# Patient Record
Sex: Female | Born: 1970 | Hispanic: No | Marital: Married | Smoking: Never smoker
Health system: Southern US, Community
[De-identification: ages and names within clinical notes are randomized; demographics above are authoritative.]

## PROBLEM LIST (undated history)

## (undated) DIAGNOSIS — N186 End stage renal disease: Secondary | ICD-10-CM

## (undated) DIAGNOSIS — E1122 Type 2 diabetes mellitus with diabetic chronic kidney disease: Secondary | ICD-10-CM

## (undated) DIAGNOSIS — I2510 Atherosclerotic heart disease of native coronary artery without angina pectoris: Secondary | ICD-10-CM

## (undated) DIAGNOSIS — Z992 Dependence on renal dialysis: Secondary | ICD-10-CM

## (undated) DIAGNOSIS — I739 Peripheral vascular disease, unspecified: Secondary | ICD-10-CM

## (undated) DIAGNOSIS — I779 Disorder of arteries and arterioles, unspecified: Secondary | ICD-10-CM

## (undated) DIAGNOSIS — I10 Essential (primary) hypertension: Secondary | ICD-10-CM

## (undated) HISTORY — PX: LAPAROSCOPIC BILATERAL SALPINGO OOPHERECTOMY: SHX5890

---

## 2014-02-21 HISTORY — PX: CARDIAC CATHETERIZATION: SHX172

## 2014-02-24 ENCOUNTER — Encounter (HOSPITAL_COMMUNITY): Payer: Self-pay | Admitting: Emergency Medicine

## 2014-02-24 ENCOUNTER — Inpatient Hospital Stay (HOSPITAL_COMMUNITY)
Admission: EM | Admit: 2014-02-24 | Discharge: 2014-02-26 | DRG: 640 | Disposition: A | Discharge status: Other Acute Inpatient Hospital | Payer: BC Managed Care – PPO | Attending: Internal Medicine | Admitting: Internal Medicine

## 2014-02-24 ENCOUNTER — Emergency Department (HOSPITAL_COMMUNITY): Payer: BC Managed Care – PPO

## 2014-02-24 DIAGNOSIS — I214 Non-ST elevation (NSTEMI) myocardial infarction: Secondary | ICD-10-CM

## 2014-02-24 DIAGNOSIS — D649 Anemia, unspecified: Secondary | ICD-10-CM | POA: Diagnosis present

## 2014-02-24 DIAGNOSIS — I10 Essential (primary) hypertension: Secondary | ICD-10-CM

## 2014-02-24 DIAGNOSIS — I2510 Atherosclerotic heart disease of native coronary artery without angina pectoris: Secondary | ICD-10-CM

## 2014-02-24 DIAGNOSIS — Z7982 Long term (current) use of aspirin: Secondary | ICD-10-CM

## 2014-02-24 DIAGNOSIS — Z79899 Other long term (current) drug therapy: Secondary | ICD-10-CM

## 2014-02-24 DIAGNOSIS — R112 Nausea with vomiting, unspecified: Secondary | ICD-10-CM

## 2014-02-24 DIAGNOSIS — N2581 Secondary hyperparathyroidism of renal origin: Secondary | ICD-10-CM | POA: Diagnosis present

## 2014-02-24 DIAGNOSIS — N186 End stage renal disease: Secondary | ICD-10-CM

## 2014-02-24 DIAGNOSIS — R799 Abnormal finding of blood chemistry, unspecified: Secondary | ICD-10-CM

## 2014-02-24 DIAGNOSIS — N19 Unspecified kidney failure: Secondary | ICD-10-CM

## 2014-02-24 DIAGNOSIS — R7989 Other specified abnormal findings of blood chemistry: Secondary | ICD-10-CM

## 2014-02-24 DIAGNOSIS — Z992 Dependence on renal dialysis: Secondary | ICD-10-CM

## 2014-02-24 DIAGNOSIS — R778 Other specified abnormalities of plasma proteins: Secondary | ICD-10-CM | POA: Diagnosis present

## 2014-02-24 DIAGNOSIS — E875 Hyperkalemia: Principal | ICD-10-CM

## 2014-02-24 DIAGNOSIS — R9431 Abnormal electrocardiogram [ECG] [EKG]: Secondary | ICD-10-CM | POA: Diagnosis present

## 2014-02-24 DIAGNOSIS — I120 Hypertensive chronic kidney disease with stage 5 chronic kidney disease or end stage renal disease: Secondary | ICD-10-CM | POA: Diagnosis present

## 2014-02-24 DIAGNOSIS — E1129 Type 2 diabetes mellitus with other diabetic kidney complication: Secondary | ICD-10-CM

## 2014-02-24 HISTORY — DX: Atherosclerotic heart disease of native coronary artery without angina pectoris: I25.10

## 2014-02-24 HISTORY — PX: CORONARY ARTERY BYPASS GRAFT: SHX141

## 2014-02-24 LAB — RENAL FUNCTION PANEL
Albumin: 3.1 g/dL — ABNORMAL LOW (ref 3.5–5.2)
BUN: 67 mg/dL — AB (ref 6–23)
CO2: 23 mEq/L (ref 19–32)
CREATININE: 7.08 mg/dL — AB (ref 0.50–1.10)
Calcium: 8.9 mg/dL (ref 8.4–10.5)
Chloride: 89 mEq/L — ABNORMAL LOW (ref 96–112)
GFR calc non Af Amer: 6 mL/min — ABNORMAL LOW (ref >90)
GFR, EST AFRICAN AMERICAN: 7 mL/min — AB (ref >90)
GLUCOSE: 260 mg/dL — AB (ref 70–99)
PHOSPHORUS: 6.5 mg/dL — AB (ref 2.3–4.6)
Potassium: 6.5 mEq/L (ref 3.7–5.3)
Sodium: 127 mEq/L — ABNORMAL LOW (ref 137–147)

## 2014-02-24 LAB — COMPREHENSIVE METABOLIC PANEL
ALK PHOS: 83 U/L (ref 39–117)
ALT: 20 U/L (ref 0–35)
AST: 15 U/L (ref 0–37)
Albumin: 3.1 g/dL — ABNORMAL LOW (ref 3.5–5.2)
BUN: 63 mg/dL — ABNORMAL HIGH (ref 6–23)
CO2: 21 meq/L (ref 19–32)
Calcium: 8.3 mg/dL — ABNORMAL LOW (ref 8.4–10.5)
Chloride: 88 mEq/L — ABNORMAL LOW (ref 96–112)
Creatinine, Ser: 7.04 mg/dL — ABNORMAL HIGH (ref 0.50–1.10)
GFR calc Af Amer: 7 mL/min — ABNORMAL LOW (ref >90)
GFR, EST NON AFRICAN AMERICAN: 6 mL/min — AB (ref >90)
GLUCOSE: 255 mg/dL — AB (ref 70–99)
SODIUM: 124 meq/L — AB (ref 137–147)
TOTAL PROTEIN: 6.9 g/dL (ref 6.0–8.3)
Total Bilirubin: 0.4 mg/dL (ref 0.3–1.2)

## 2014-02-24 LAB — I-STAT TROPONIN, ED: Troponin i, poc: 0.59 ng/mL (ref 0.00–0.08)

## 2014-02-24 LAB — CBC WITH DIFFERENTIAL/PLATELET
Basophils Absolute: 0 10*3/uL (ref 0.0–0.1)
Basophils Relative: 0 % (ref 0–1)
Eosinophils Absolute: 0.1 10*3/uL (ref 0.0–0.7)
Eosinophils Relative: 1 % (ref 0–5)
HCT: 33.1 % — ABNORMAL LOW (ref 36.0–46.0)
Hemoglobin: 11 g/dL — ABNORMAL LOW (ref 12.0–15.0)
Lymphocytes Relative: 12 % (ref 12–46)
Lymphs Abs: 1.1 10*3/uL (ref 0.7–4.0)
MCH: 31.4 pg (ref 26.0–34.0)
MCHC: 33.2 g/dL (ref 30.0–36.0)
MCV: 94.6 fL (ref 78.0–100.0)
Monocytes Absolute: 0.2 10*3/uL (ref 0.1–1.0)
Monocytes Relative: 3 % (ref 3–12)
NEUTROS PCT: 84 % — AB (ref 43–77)
Neutro Abs: 7.5 10*3/uL (ref 1.7–7.7)
Platelets: 187 10*3/uL (ref 150–400)
RBC: 3.5 MIL/uL — AB (ref 3.87–5.11)
RDW: 15.2 % (ref 11.5–15.5)
WBC: 8.9 10*3/uL (ref 4.0–10.5)

## 2014-02-24 LAB — HEPATITIS B SURFACE ANTIGEN: Hepatitis B Surface Ag: NEGATIVE

## 2014-02-24 LAB — I-STAT CHEM 8, ED
BUN: 70 mg/dL — AB (ref 6–23)
CREATININE: 7.8 mg/dL — AB (ref 0.50–1.10)
Calcium, Ion: 1.04 mmol/L — ABNORMAL LOW (ref 1.12–1.23)
Chloride: 95 mEq/L — ABNORMAL LOW (ref 96–112)
Glucose, Bld: 256 mg/dL — ABNORMAL HIGH (ref 70–99)
HCT: 35 % — ABNORMAL LOW (ref 36.0–46.0)
Hemoglobin: 11.9 g/dL — ABNORMAL LOW (ref 12.0–15.0)
Potassium: 7.9 mEq/L (ref 3.7–5.3)
SODIUM: 125 meq/L — AB (ref 137–147)
TCO2: 22 mmol/L (ref 0–100)

## 2014-02-24 LAB — GLUCOSE, CAPILLARY: GLUCOSE-CAPILLARY: 75 mg/dL (ref 70–99)

## 2014-02-24 MED ORDER — SODIUM POLYSTYRENE SULFONATE 15 GM/60ML PO SUSP
30.0000 g | Freq: Once | ORAL | Status: AC
  Administered 2014-02-24: 30 g via ORAL
  Filled 2014-02-24: qty 120

## 2014-02-24 MED ORDER — ACETAMINOPHEN 325 MG PO TABS: 650.0000 mg | ORAL_TABLET | Freq: Four times a day (QID) | ORAL | Status: DC | PRN

## 2014-02-24 MED ORDER — ASPIRIN 325 MG PO TABS
325.0000 mg | ORAL_TABLET | Freq: Once | ORAL | Status: AC
  Administered 2014-02-24: 325 mg via ORAL
  Filled 2014-02-24: qty 1

## 2014-02-24 MED ORDER — VITAMIN D (ERGOCALCIFEROL) 1.25 MG (50000 UNIT) PO CAPS
50000.0000 [IU] | ORAL_CAPSULE | ORAL | Status: DC
  Administered 2014-02-24: 50000 [IU] via ORAL
  Filled 2014-02-24: qty 1

## 2014-02-24 MED ORDER — ALBUTEROL SULFATE (2.5 MG/3ML) 0.083% IN NEBU: 2.5000 mg | INHALATION_SOLUTION | RESPIRATORY_TRACT | Status: DC | PRN

## 2014-02-24 MED ORDER — CARVEDILOL 25 MG PO TABS
25.0000 mg | ORAL_TABLET | Freq: Two times a day (BID) | ORAL | Status: DC
  Administered 2014-02-25 – 2014-02-26 (×4): 25 mg via ORAL
  Filled 2014-02-24 (×5): qty 1

## 2014-02-24 MED ORDER — ACETAMINOPHEN 325 MG PO TABS
650.0000 mg | ORAL_TABLET | Freq: Once | ORAL | Status: AC
  Administered 2014-02-24: 650 mg via ORAL

## 2014-02-24 MED ORDER — ONDANSETRON HCL 4 MG PO TABS: 4.0000 mg | ORAL_TABLET | Freq: Every day | ORAL | Status: DC | PRN

## 2014-02-24 MED ORDER — ACETAMINOPHEN 325 MG PO TABS
ORAL_TABLET | ORAL | Status: AC
Start: 2014-02-24 — End: 2014-02-25
  Filled 2014-02-24: qty 2

## 2014-02-24 MED ORDER — SEVELAMER CARBONATE 800 MG PO TABS
1600.0000 mg | ORAL_TABLET | Freq: Three times a day (TID) | ORAL | Status: DC
  Filled 2014-02-24 (×5): qty 2

## 2014-02-24 MED ORDER — DEXTROSE 50 % IV SOLN
50.0000 mL | Freq: Once | INTRAVENOUS | Status: AC
  Administered 2014-02-24: 50 mL via INTRAVENOUS
  Filled 2014-02-24: qty 50

## 2014-02-24 MED ORDER — SODIUM CHLORIDE 0.9 % IV SOLN
1.0000 g | Freq: Once | INTRAVENOUS | Status: AC
  Administered 2014-02-24: 1 g via INTRAVENOUS
  Filled 2014-02-24: qty 10

## 2014-02-24 MED ORDER — ONDANSETRON HCL 4 MG/2ML IJ SOLN
4.0000 mg | Freq: Once | INTRAMUSCULAR | Status: AC
  Administered 2014-02-24: 4 mg via INTRAVENOUS
  Filled 2014-02-24: qty 2

## 2014-02-24 MED ORDER — ACETAMINOPHEN 650 MG RE SUPP: 650.0000 mg | Freq: Four times a day (QID) | RECTAL | Status: DC | PRN

## 2014-02-24 MED ORDER — INSULIN ASPART 100 UNIT/ML IV SOLN
10.0000 [IU] | Freq: Once | INTRAVENOUS | Status: AC
  Administered 2014-02-24: 10 [IU] via INTRAVENOUS
  Filled 2014-02-24 (×2): qty 0.1

## 2014-02-24 MED ORDER — HYDROMORPHONE HCL PF 1 MG/ML IJ SOLN: 1.0000 mg | INTRAMUSCULAR | Status: DC | PRN

## 2014-02-24 MED ORDER — ALBUTEROL SULFATE (2.5 MG/3ML) 0.083% IN NEBU
5.0000 mg | INHALATION_SOLUTION | Freq: Once | RESPIRATORY_TRACT | Status: AC
  Administered 2014-02-24: 5 mg via RESPIRATORY_TRACT
  Filled 2014-02-24: qty 6

## 2014-02-24 MED ORDER — ONDANSETRON HCL 4 MG/2ML IJ SOLN: 4.0000 mg | Freq: Four times a day (QID) | INTRAMUSCULAR | Status: DC | PRN

## 2014-02-24 MED ORDER — ASPIRIN EC 81 MG PO TBEC
81.0000 mg | DELAYED_RELEASE_TABLET | Freq: Every day | ORAL | Status: DC
  Administered 2014-02-25 – 2014-02-26 (×2): 81 mg via ORAL
  Filled 2014-02-24 (×2): qty 1

## 2014-02-24 MED ORDER — SODIUM CHLORIDE 0.9 % IV SOLN: 62.5000 mg | INTRAVENOUS | Status: DC

## 2014-02-24 MED ORDER — HEPARIN SODIUM (PORCINE) 5000 UNIT/ML IJ SOLN
5000.0000 [IU] | Freq: Three times a day (TID) | INTRAMUSCULAR | Status: DC
  Administered 2014-02-24 – 2014-02-26 (×4): 5000 [IU] via SUBCUTANEOUS
  Filled 2014-02-24 (×8): qty 1

## 2014-02-24 MED ORDER — DILTIAZEM HCL ER COATED BEADS 360 MG PO CP24
360.0000 mg | ORAL_CAPSULE | Freq: Every day | ORAL | Status: DC
  Administered 2014-02-25 – 2014-02-26 (×2): 360 mg via ORAL
  Filled 2014-02-24 (×2): qty 1

## 2014-02-24 MED ORDER — CALCITRIOL 0.25 MCG PO CAPS
0.2500 ug | ORAL_CAPSULE | ORAL | Status: DC
  Administered 2014-02-25: 0.25 ug via ORAL
  Filled 2014-02-24 (×2): qty 1

## 2014-02-24 MED ORDER — INSULIN ASPART 100 UNIT/ML ~~LOC~~ SOLN
0.0000 [IU] | Freq: Three times a day (TID) | SUBCUTANEOUS | Status: DC
  Administered 2014-02-25: 2 [IU] via SUBCUTANEOUS
  Administered 2014-02-26: 1 [IU] via SUBCUTANEOUS
  Administered 2014-02-26: 2 [IU] via SUBCUTANEOUS

## 2014-02-24 MED ORDER — SEVELAMER CARBONATE 800 MG PO TABS
2400.0000 mg | ORAL_TABLET | Freq: Three times a day (TID) | ORAL | Status: DC
  Administered 2014-02-25 – 2014-02-26 (×6): 2400 mg via ORAL
  Filled 2014-02-24 (×7): qty 3

## 2014-02-24 MED ORDER — ALUM & MAG HYDROXIDE-SIMETH 200-200-20 MG/5ML PO SUSP: 30.0000 mL | Freq: Four times a day (QID) | ORAL | Status: DC | PRN

## 2014-02-24 MED ORDER — ACETAMINOPHEN 325 MG PO TABS
325.0000 mg | ORAL_TABLET | Freq: Two times a day (BID) | ORAL | Status: DC | PRN
  Administered 2014-02-25: 325 mg via ORAL

## 2014-02-24 MED ORDER — CALCITRIOL 0.25 MCG PO CAPS
0.2500 ug | ORAL_CAPSULE | Freq: Every day | ORAL | Status: DC
  Administered 2014-02-24 – 2014-02-26 (×2): 0.25 ug via ORAL
  Filled 2014-02-24 (×3): qty 1

## 2014-02-24 MED ORDER — ONDANSETRON HCL 4 MG PO TABS: 4.0000 mg | ORAL_TABLET | Freq: Four times a day (QID) | ORAL | Status: DC | PRN

## 2014-02-24 MED ORDER — SEVELAMER CARBONATE 800 MG PO TABS: 800.0000 mg | ORAL_TABLET | Freq: Three times a day (TID) | ORAL | Status: DC

## 2014-02-24 MED ORDER — SODIUM BICARBONATE 8.4 % IV SOLN
50.0000 meq | Freq: Once | INTRAVENOUS | Status: AC
  Administered 2014-02-24: 50 meq via INTRAVENOUS
  Filled 2014-02-24: qty 50

## 2014-02-24 MED ORDER — LOSARTAN POTASSIUM 25 MG PO TABS
25.0000 mg | ORAL_TABLET | Freq: Every day | ORAL | Status: DC
  Administered 2014-02-25: 25 mg via ORAL
  Filled 2014-02-24 (×2): qty 1

## 2014-02-24 MED ORDER — RENA-VITE PO TABS
1.0000 | ORAL_TABLET | Freq: Every day | ORAL | Status: DC
  Administered 2014-02-24 – 2014-02-25 (×2): 1 via ORAL
  Filled 2014-02-24 (×3): qty 1

## 2014-02-24 MED ORDER — SODIUM CHLORIDE 0.9 % IJ SOLN
3.0000 mL | Freq: Two times a day (BID) | INTRAMUSCULAR | Status: DC
  Administered 2014-02-24 – 2014-02-26 (×4): 3 mL via INTRAVENOUS

## 2014-02-24 NOTE — ED Notes (Signed)
Abnormal labs given to Dr. Darl Householder

## 2014-02-24 NOTE — Consult Note (Signed)
CARDIOLOGY CONSULT NOTE   Patient ID: Jeffrie Stander MRN: [id number] DOB/AGE: Feb 10, 1971 43 y.o.  Admit Date: 02/24/2014 Referring Physician: PTH Primary Physician: No primary provider on file. Consulting Cardiologist: [place] Primary Cardiologist:  1. [facility]. [facility] 2. [facility] 3. [facility]  Reason for Consultation:  Positive Troponin with known CAD  Clinical Summary Ms. Yahr is a 43 y.o.female with history of known coronary artery disease, hypertension, diabetes, end-stage renal disease, and hyperlipidemia. The patient is being planned for kidney transplant at [facility], and underwent a pre-operative cardiac evaluation by normally fDr.[facility], where she had an abnormal stress test. He was transferred to [facility] where she underwent cardiac catheterization on 02/21/2014.   This demonstrated three-vessel coronary artery disease, LAD 90% proximal diagonal 95% left circumflex 75% proximal with OM1 90% right coronary artery 95% proximal. She is planned for coronary artery bypass grafting by Dr. Olena Leatherwood in June of 2015.  The patient was in her usual state of health when she had sudden onset of severe weakness.  She stated that she was drinking tea and was unable to finish, got up to use the bathroom and required help returning back to her seat. EMS was called and she was brought to the emergency room.  On arrival the patient blood pressure was 105/78 heart rate 58 O2 sat 100%. The patient's was found to be hyperkalemic with a potassium of 7.9, sodium 125, chloride 95, BUN 70, serum creatinine 7.80. Glucose was 256. She was found to be mildly anemic with a hemoglobin 11.0, hematocrit of 33.1 chest x-ray revealed cardiomegaly with mild pulmonary vascular congestion. EKG revealing normal sinus rhythm with right bundle branch block prolonged QT interval.  Currently the patient is feeling  some chest pressure, but denies chest pain or shortness of breath nausea vomiting or diaphoresis. The patient is seen and examined in the emergency room prior to being transferred to  dialysis Allergies  Allergen Reactions  . Norvasc [Amlodipine] Swelling    Medications Scheduled Medications: . calcitRIOL  0.25 mcg Oral Daily  . [START ON 02/27/2014] ferric gluconate (FERRLECIT/NULECIT) IV  62.5 mg Intravenous Q Wed-HD  . multivitamin  1 tablet Oral QHS  . sevelamer carbonate  1,600 mg Oral TID WC    Infusions:    PRN Medications:     Past Medical History  Diagnosis Date  . Dialysis patient   . Diabetes mellitus without complication   . Coronary artery disease     Past Surgical History  Procedure Laterality Date  . Cesarean section    . Laparoscopic bilateral salpingo oopherectomy    . Cardiac catheterization  02/21/14    done at [facility]-    Family History  Problem Relation Age of Onset  . Heart attack Mother   . Heart attack Father   . Heart attack Maternal Grandmother   . Heart attack Maternal Grandfather   . Hypertension Father     Social History Ms. Overacker reports that she has never smoked. She does not have any smokeless tobacco history on file. Ms. Shibuya reports that she does not drink alcohol.  Review of Systems Otherwise reviewed and negative except as outlined.  Physical Examination Blood pressure 158/86, pulse 65, resp. rate 18, SpO2 100.00%. No intake or output data in the 24 hours ending 02/24/14 1708  Telemetry: Normal sinus rhythm.  GEN: No acute distress HEENT: Conjunctiva and lids normal, oropharynx clear with moist mucosa. Neck: Supple, no elevated JVP or carotid  bruits, no thyromegaly. Lungs: Clear to auscultation, nonlabored breathing at rest. Cardiac: Regular rate and rhythm, 1/6 systolic murmur, no pericardial rub. Abdomen: Soft, nontender, no hepatomegaly, bowel sounds present, no guarding or rebound. Extremities: No pitting edema,  distal pulses 2+. Left arm dialysis fistula with good thrill. Skin: Warm and dry. Musculoskeletal: No kyphosis. Neuropsychiatric: Alert and oriented x3, affect grossly appropriate.  Prior Cardiac Testing/Procedures 1. Cardiac cath-[facility] 02/21/2014 LAD - 90% proximal; D1 - 95% LCx dominant - - 75% proximal; OM1 - 90% RCA - codominant; 95% proxia LV - moderate GH   Lab Results  Basic Metabolic Panel:  Recent Labs Lab 02/24/14 1440 02/24/14 1459  NA 124* 125*  K >7.7* 7.9*  CL 88* 95*  CO2 21  --   GLUCOSE 255* 256*  BUN 63* 70*  CREATININE 7.04* 7.80*  CALCIUM 8.3*  --     Liver Function Tests:  Recent Labs Lab 02/24/14 1440  AST 15  ALT 20  ALKPHOS 83  BILITOT 0.4  PROT 6.9  ALBUMIN 3.1*    CBC:  Recent Labs Lab 02/24/14 1440 02/24/14 1459  WBC 8.9  --   NEUTROABS 7.5  --   HGB 11.0* 11.9*  HCT 33.1* 35.0*  MCV 94.6  --   PLT 187  --     Cardiac Enzymes: No results found for this basename: CKTOTAL, CKMB, CKMBINDEX, TROPONINI,  in the last 168 hours  BNP: No components found with this basename: POCBNP,    Radiology: Dg Chest Portable 1 View  02/24/2014   CLINICAL DATA:  43 year old female with weakness, nausea and vomiting.  EXAM: PORTABLE CHEST - 1 VIEW  COMPARISON:  None.  FINDINGS: Cardiomegaly and mild pulmonary vascular congestion noted.  A right IJ catheter noted with tips overlying the cavoatrial junction and right atrium.  There is no evidence of focal airspace disease, pulmonary edema, suspicious pulmonary nodule/mass, pleural effusion, or pneumothorax. No acute bony abnormalities are identified.  IMPRESSION: Cardiomegaly with mild pulmonary vascular congestion   Electronically Signed   By: Hassan Rowan M.D.   On: 02/24/2014 14:51     ECG: Normal sinus rhythm with right bundle branch block prolonged QT interval   Impression and Recommendations  1. CAD: Normally followed by Dr. Luiz Ochoa in [place], [place], with abnormal  stress test. She is now followed by Dr. Duke Salvia at [facility] with cardiac catheterization on [id number] revealing three-vessel disease, was planned coronary artery bypass grafting by Dr. Glenda Chroman at [facility] in June. The patient will be continued on her current medication regimen she was taking prior to admission, watching blood pressure and heart rate. Would not start beta blockers at this time in the setting of hyperkalemia. The patient will be followed closely with blood pressure monitoring and evaluation. No further testing will be completed if she has had an extensive cardiac workup prior to this admission within the last 3 days.  2. ESRD: Going to dialysis emergently secondary to hyperkalemia with potassium of 7.7. Patient is planned for kidney transplant with her sister as donor once cardiac issues have been resolved. All of these evaluations and surgeries are planned at [facility].  3.  Hypertension: Charlestine Massed moderately controlled with most recent blood pressure 158/86. The patient will be continued on antihypertensive medications as she was taking prior to admission, but will be closely followed to evaluate for hypotension after dialysis.  4. Diabetes: To be followed by PTH.  Signed: Phill Myron. Lawrence NP  02/24/2014, 5:08 PM Co-Sign  MD  Patient examined chart reviewed See separate progress note  Josue Hector

## 2014-02-24 NOTE — ED Notes (Signed)
resp easier, family at bedside

## 2014-02-24 NOTE — ED Notes (Signed)
Pt arrived from home by [facility] with c/o sudden onset of nausea, vomiting and weakness. Denies any pain. Pt is a dialysis pt and does dialysis at home. They were getting ready to start dialysis when pt started having episode. Pt is scheduled to have a CABG done on June 23. 12 lead showed NSR with Bigeminy, RBBB, and first degree AV block. Pt is supposed to have dialysis 4 x a week and did not do dialysis yesterday.

## 2014-02-24 NOTE — ED Notes (Addendum)
K=greater than 7.7 and blood sample is not hemolyzed; Frederica Kuster, lab tech reported; Eme, RN and Dr. Darl Householder are aware

## 2014-02-24 NOTE — ED Notes (Addendum)
Pt had Cardiac cath done at [facility] on Thursday-- has OHS scheduled June 23rd at [facility].  Dialysis catheter in right subclavian area placed on 08/14/13.

## 2014-02-24 NOTE — Procedures (Signed)
I was present at this dialysis session, have reviewed the session itself and made  appropriate changes  Kelly Splinter MD (pgr) [phone]    ([phone] 02/24/2014, 5:41 PM

## 2014-02-24 NOTE — ED Provider Notes (Signed)
CSN: [id number]     Arrival date & time 02/24/14  1302 History   First MD Initiated Contact with Patient 02/24/14 1307     Chief Complaint  Patient presents with  . Emesis  . Weakness     (Consider location/radiation/quality/duration/timing/severity/associated sxs/prior Treatment) The history is provided by the patient.  Isabel Sharp is a 43 y.o. female hx of DM, CAD, home dialysis (last dialyzed 2 days ago) here with weakness, vomiting. She is on dialysis at home four times a week. Last dialysis was 2 days ago. She has been feeling nauseous and vomiting and weakness. She missed her dialysis yesterday. She was about to start dialysis today and then suddenly became more weak and nauseous and short of breath. She states that she is weak to the point that she is trouble ambulating. Denies trouble speaking. Denies any chest pain she does have significant coronary artery disease and is scheduled for bypass in June.    Past Medical History  Diagnosis Date  . Dialysis patient   . Diabetes mellitus without complication   . Coronary artery disease    Past Surgical History  Procedure Laterality Date  . Cesarean section    . Laparoscopic bilateral salpingo oopherectomy    . Cardiac catheterization  02/21/14    done at [facility]-   No family history on file. History  Substance Use Topics  . Smoking status: Never Smoker   . Smokeless tobacco: Not on file  . Alcohol Use: No   OB History   Grav Para Term Preterm Abortions TAB SAB Ect Mult Living                 Review of Systems  Respiratory: Positive for shortness of breath.   Gastrointestinal: Positive for vomiting.  Neurological: Positive for weakness.  All other systems reviewed and are negative.     Allergies  Norvasc  Home Medications   Prior to Admission medications   Medication Sig Start Date End Date Taking? Authorizing Provider  diltiazem (CARDIZEM CD) 360 MG 24 hr capsule Take 360 mg by mouth daily.   Yes Historical  Provider, MD   BP 105/78  Pulse 58  Resp 18  SpO2 100% Physical Exam  Nursing note and vitals reviewed. Constitutional: She is oriented to person, place, and time.  Chronically ill, uncomfortable   HENT:  Head: Normocephalic.  Mouth/Throat: Oropharynx is clear and moist.  Eyes: Conjunctivae are normal. Pupils are equal, round, and reactive to light.  Neck: Normal range of motion. Neck supple.  Cardiovascular: Normal rate, regular rhythm and normal heart sounds.   Pulmonary/Chest: Effort normal.  Dec breath sounds bilateral bases. R chest with dialysis catheter with no obvious cellulitis   Abdominal: Soft. Bowel sounds are normal. She exhibits no distension. There is no tenderness. There is no rebound.  Musculoskeletal: Normal range of motion.  1+ edema bilaterally   Neurological: She is alert and oriented to person, place, and time. No cranial nerve deficit. Coordination normal.  Skin: Skin is warm and dry.  Psychiatric: She has a normal mood and affect. Her behavior is normal. Judgment and thought content normal.    ED Course  Procedures (including critical care time) Labs Review Labs Reviewed  CBC WITH DIFFERENTIAL - Abnormal; Notable for the following:    RBC 3.50 (*)    Hemoglobin 11.0 (*)    HCT 33.1 (*)    Neutrophils Relative % 84 (*)    All other components within normal limits  COMPREHENSIVE METABOLIC  PANEL - Abnormal; Notable for the following:    Sodium 124 (*)    Chloride 88 (*)    Glucose, Bld 255 (*)    BUN 63 (*)    Creatinine, Ser 7.04 (*)    Calcium 8.3 (*)    Albumin 3.1 (*)    GFR calc non Af Amer 6 (*)    GFR calc Af Amer 7 (*)    All other components within normal limits  I-STAT TROPOININ, ED - Abnormal; Notable for the following:    Troponin i, poc 0.59 (*)    All other components within normal limits  I-STAT CHEM 8, ED - Abnormal; Notable for the following:    Sodium 125 (*)    Potassium 7.9 (*)    Chloride 95 (*)    BUN 70 (*)     Creatinine, Ser 7.80 (*)    Glucose, Bld 256 (*)    Calcium, Ion 1.04 (*)    Hemoglobin 11.9 (*)    HCT 35.0 (*)    All other components within normal limits  URINE CULTURE  URINALYSIS, ROUTINE W REFLEX MICROSCOPIC    Imaging Review Dg Chest Portable 1 View  02/24/2014   CLINICAL DATA:  43 year old female with weakness, nausea and vomiting.  EXAM: PORTABLE CHEST - 1 VIEW  COMPARISON:  None.  FINDINGS: Cardiomegaly and mild pulmonary vascular congestion noted.  A right IJ catheter noted with tips overlying the cavoatrial junction and right atrium.  There is no evidence of focal airspace disease, pulmonary edema, suspicious pulmonary nodule/mass, pleural effusion, or pneumothorax. No acute bony abnormalities are identified.  IMPRESSION: Cardiomegaly with mild pulmonary vascular congestion   Electronically Signed   By: Hassan Rowan M.D.   On: 02/24/2014 14:51     EKG Interpretation   Date/Time:  Sunday Feb 24 2014 13:17:27 EDT Ventricular Rate:  57 PR Interval:  311 QRS Duration: 180 QT Interval:  575 QTC Calculation: 560 R Axis:   66 Text Interpretation:  Sinus rhythm Prolonged PR interval Probable left  atrial enlargement Right bundle branch block No previous ECGs available  Confirmed by Ariel Dimitri  MD, Ashantee Deupree ([id number]) on 02/24/2014 1:23:05 PM      CRITICAL CARE Performed by: Wandra Arthurs   Total critical care time: 30 min   Critical care time was exclusive of separately billable procedures and treating other patients.  Critical care was necessary to treat or prevent imminent or life-threatening deterioration.  Critical care was time spent personally by me on the following activities: development of treatment plan with patient and/or surrogate as well as nursing, discussions with consultants, evaluation of patient's response to treatment, examination of patient, obtaining history from patient or surrogate, ordering and performing treatments and interventions, ordering and review of  laboratory studies, ordering and review of radiographic studies, pulse oximetry and re-evaluation of patient's condition.   MDM   Final diagnoses:  None   Isabel Sharp is a 43 y.o. female here with weakness, vomiting, SOB. Given that she missed dialysis, concerned for hyperkalemia. EKG showed possible bundle branch block, will check K. Will likely admit.   3:37 PM K 7.9 with EKG showed widened QRS. Concerned for hyperkalemia. Given ca, insulin, glucose, kayexelate, albuterol. I called Dr. Melvia Heaps, who will dialyze patient. I called Dr. Hartford Poli, who will admit. Also positive troponin, likely demand ischemia. Given ASA, hold heparin. Will have cardiology follow with patient.     Wandra Arthurs, MD 02/24/14 [phone]

## 2014-02-24 NOTE — ED Notes (Addendum)
States "it is harder to breathe-- "  PA and MD notified , O2 on 2L/M/[place]

## 2014-02-24 NOTE — ED Notes (Signed)
MD at bedside. 

## 2014-02-24 NOTE — Progress Notes (Signed)
Patient ID: Santia Labate, female   DOB: [birth year], 43 y.o.   MRN: [id number] See full note from [place]. 43 yo just cathed at [facility] after abnormal stress echo in anticipation of renal transplant On dialysis since 11/14 from DM and HTN  Sister will be donor Admitted with weakness and hyperkalemia  ECG with prominent T waves PR 333 and RBBB signs of hyperkalemia  No chest pain  Med list indicates amlodipine but she does not take as it causes swelling No beta blocker due to hyperkalemia  No report of EF from [facility] but patient denies history of MI    Cath Thursday:  Cardiac catheterization and coronary angiography. This revealed severe three-vessel coronary artery disease including a 90% proximal LAD lesion, 95% first diagonal lesion, 75% proximal left circumflex lesion, and 95% stenosis of a codominant right coronary artery.   To have CABG in June with Dr Lunette Stands  Exam remarkable for overweight indian female SEM no rub  Fistula LUE with good thrill Dialysis catheter right subclavian  Plan:  Proceed with dialysis for hyperkalemia and follow conduction abnormalities on ECG Will order echo for EF and r/o pericardial effusion Try to avoid hypotension with dialysis given severe 3VD ASA  Josue Hector

## 2014-02-24 NOTE — Consult Note (Signed)
[facility] Renal Consultation Note  Indication for Consultation:  Management of ESRD/hemodialysis; anemia, hypertension/volume and secondary hyperparathyroidism  HPI: Isabel Sharp is a 43 y.o. female on Home Hemodialysis= M,W,THUR,SAT ( Next Stage Machine) followed by Dr. Ernesto Rutherford in [place]  , [place] presents to ER co nausea,/ vomiting increasing weakness and sob to the extent was not able to put self on HD this am.  Potassium in ER 7.9 CXR showing no pulmonary edema.,She reports missing HD Yesterday with last HD Friday 02/22/14 and has been eating Tomatoes/ sweet Potatoes and other high potassium Foods.Gives history of ESRD secondary to HTN "with some diabetic history on po meds , is scheduled for CABG March 26 2014 at [place] (CAD found on Renal tx [facility]).Has a Perm cath with plans for AVF in [place] in [place] .Now in ER with Brother at bedside feels better after po Kayexalate, IV Ca Gluconate, d50 iv, Insulin iv , and Na bicarb given,    Past Medical History  Diagnosis Date  . Dialysis patient   . Diabetes mellitus without complication   . Coronary artery disease     Past Surgical History  Procedure Laterality Date  . Cesarean section    . Laparoscopic bilateral salpingo oopherectomy    . Cardiac catheterization  02/21/14    done at [facility]-     No family history on file. Social - lives in [place], married , 7 yr old son, and    reports that she has never smoked. She does not have any smokeless tobacco history on file. She reports that she does not drink alcohol or use illicit drugs.   Allergies  Allergen Reactions  . Norvasc [Amlodipine] Swelling    Prior to Admission medications   Medication Sig Start Date End Date Taking? Authorizing Provider  acetaminophen (TYLENOL) 325 MG tablet Take 325 mg by mouth 2 (two) times daily as needed (pain).   Yes Historical Provider, MD  aspirin EC 81 MG tablet Take 81 mg by mouth daily.   Yes Historical Provider, MD   bumetanide (BUMEX) 2 MG tablet Take 2 mg by mouth daily.   Yes Historical Provider, MD  calcitRIOL (ROCALTROL) 0.25 MCG capsule Take 0.25 mcg by mouth 3 (three) times a week. Monday, Wednesday and Friday   Yes Historical Provider, MD  carvedilol (COREG) 25 MG tablet Take 25 mg by mouth 2 (two) times daily with a meal.   Yes Historical Provider, MD  diltiazem (CARDIZEM CD) 120 MG 24 hr capsule Take 360 mg by mouth daily.    Yes Historical Provider, MD  glipiZIDE (GLUCOTROL) 10 MG tablet Take 10-20 mg by mouth 2 (two) times daily. Take 2 tablets (20 mg) by mouth every morning and 1 tablet (10 mg) at bedtime   Yes Historical Provider, MD  losartan (COZAAR) 25 MG tablet Take 25 mg by mouth at bedtime.    Yes Historical Provider, MD  ondansetron (ZOFRAN) 4 MG tablet Take 4 mg by mouth daily as needed for nausea or vomiting.   Yes Historical Provider, MD  pravastatin (PRAVACHOL) 40 MG tablet Take 40 mg by mouth at bedtime.   Yes Historical Provider, MD  sevelamer carbonate (RENVELA) 800 MG tablet Take 2,400 mg by mouth 3 (three) times daily with meals.   Yes Historical Provider, MD  Vitamin D, Ergocalciferol, (DRISDOL) 50000 UNITS CAPS capsule Take 50,000 Units by mouth every 7 (seven) days. On Sundays   Yes Historical Provider, MD    PRN:  Results for orders placed during the hospital encounter of 02/24/14 (from the past 48 hour(s))  CBC WITH DIFFERENTIAL     Status: Abnormal   Collection Time    02/24/14  2:40 PM      Result Value Ref Range   WBC 8.9  4.0 - 10.5 K/uL   RBC 3.50 (*) 3.87 - 5.11 MIL/uL   Hemoglobin 11.0 (*) 12.0 - 15.0 g/dL   HCT 33.1 (*) 36.0 - 46.0 %   MCV 94.6  78.0 - 100.0 fL   MCH 31.4  26.0 - 34.0 pg   MCHC 33.2  30.0 - 36.0 g/dL   RDW 15.2  11.5 - 15.5 %   Platelets 187  150 - 400 K/uL   Neutrophils Relative % 84 (*) 43 - 77 %   Neutro Abs 7.5  1.7 - 7.7 K/uL   Lymphocytes Relative 12  12 - 46 %   Lymphs Abs 1.1  0.7 - 4.0 K/uL   Monocytes Relative 3  3 - 12 %    Monocytes Absolute 0.2  0.1 - 1.0 K/uL   Eosinophils Relative 1  0 - 5 %   Eosinophils Absolute 0.1  0.0 - 0.7 K/uL   Basophils Relative 0  0 - 1 %   Basophils Absolute 0.0  0.0 - 0.1 K/uL  COMPREHENSIVE METABOLIC PANEL     Status: Abnormal (Preliminary result)   Collection Time    02/24/14  2:40 PM      Result Value Ref Range   Sodium 124 (*) 137 - 147 mEq/L   Potassium PENDING  3.7 - 5.3 mEq/L   Chloride 88 (*) 96 - 112 mEq/L   CO2 21  19 - 32 mEq/L   Glucose, Bld 255 (*) 70 - 99 mg/dL   BUN 63 (*) 6 - 23 mg/dL   Creatinine, Ser 7.04 (*) 0.50 - 1.10 mg/dL   Calcium 8.3 (*) 8.4 - 10.5 mg/dL   Total Protein 6.9  6.0 - 8.3 g/dL   Albumin 3.1 (*) 3.5 - 5.2 g/dL   AST 15  0 - 37 U/L   ALT 20  0 - 35 U/L   Alkaline Phosphatase 83  39 - 117 U/L   Total Bilirubin 0.4  0.3 - 1.2 mg/dL   GFR calc non Af Amer 6 (*) >90 mL/min   GFR calc Af Amer 7 (*) >90 mL/min   Comment: (NOTE)     The eGFR has been calculated using the CKD EPI equation.     This calculation has not been validated in all clinical situations.     eGFR's persistently <90 mL/min signify possible Chronic Kidney     Disease.  Randolm Idol, ED     Status: Abnormal   Collection Time    02/24/14  2:49 PM      Result Value Ref Range   Troponin i, poc 0.59 (*) 0.00 - 0.08 ng/mL   Comment NOTIFIED PHYSICIAN     Comment 3            Comment: Due to the release kinetics of cTnI,     a negative result within the first hours     of the onset of symptoms does not rule out     myocardial infarction with certainty.     If myocardial infarction is still suspected,     repeat the test at appropriate intervals.  I-STAT CHEM 8, ED     Status: Abnormal   Collection Time  02/24/14  2:59 PM      Result Value Ref Range   Sodium 125 (*) 137 - 147 mEq/L   Potassium 7.9 (*) 3.7 - 5.3 mEq/L   Chloride 95 (*) 96 - 112 mEq/L   BUN 70 (*) 6 - 23 mg/dL   Creatinine, Ser 7.80 (*) 0.50 - 1.10 mg/dL   Glucose, Bld 256 (*) 70 - 99 mg/dL    Calcium, Ion 1.04 (*) 1.12 - 1.23 mmol/L   TCO2 22  0 - 100 mmol/L   Hemoglobin 11.9 (*) 12.0 - 15.0 g/dL   HCT 35.0 (*) 36.0 - 46.0 %   Comment NOTIFIED PHYSICIAN      ROS: Denies fevers, chills, Some Nausea and( Constipation mild past few months,) No melena or abd pain.No dizziness or seizures.  Physical Exam: Filed Vitals:   02/24/14 1441  BP: 105/78  Pulse: 58  Resp: 18     General: Alert ,obese Female NAD, Appropriate  HEENT: [place], Nonicteric, MMM, Eyes: EOMI Neck: supple, no jvd Heart: RRR, no mur,rub or gallop Lungs: CTA bilat. non labored  Abdomen:Obese, bs pos, soft, ND,NT Extremities: no pedal edema Skin: no overt rash or pedal ulcers Neuro: alert, OX3, slow movements at first then able to move extrem. Better at end of exam Dialysis Access: R IJ Perm Cath / nontender  Dialysis: AT HOME wit Next Stage on 4times a week .MWTHRSAT EDW 73kg HD Bath /  Time 3 hrs  Heparin  2,000 units bolus trhen 2.3,2.4 boluses . Access R ij perm cath( "plans for June 1 L arm AVF") BFR   DFR      Zemplar 0 mcg IV/HD/ PO Calcitriol 0.9mg  Mon . WED. FRI/ Epogen 10,000 weekly   Units IV/HD  Venofer  573mper week hd   Assessment/Plan 1. Hyperkalemia sec. To missed hemodialysis and diet discretions - Acute HD reck K after Kayexalate and IV meds given in ER 2. ESRD -  Normally HD 4 times a week at home wit next stage machine/ hd today 4h rs and tomorrow hd to kee on schedule 3. Hypertension/volume  - stable / 2 kg over edw per home scales and bp stable / home meds Ditiazem 36054m day and Losartan 25 mg hs / vol by exam stable "usually pull 2 l at home q tx" 4. Anemia  - weekly venofer 44m28m hd and hold epo with hgb 11.0 fu am labs 5. Metabolic bone disease -   Po calcitriol; and po Ergocalciferol/ ren vela binder/ fu ca and phos 6. Nutrition - Renal /Carb  Mod./ renavite  7. CAD-meds (""for CABG March 26, 2014 [facility]"") 8. DM type 2- per admit team  DaviErnest Haber-C CaroHobart-[id number]/2015, 3:42 PM   Pt seen, examined, agree w assess/plan as above with additions as indicated. Out of town dialysis [place].  Missed her treatment yesterday, is visiting in [place] presented to ED with severe muscle weakness and K+ 7.9.  She has rec'd appropriate Rx in ED acutely and is now on dialysis for severe hyperkalemia.  Telemetry showing narrow QRS now.  There is an AVF placed in Nov '14 which does not appear ready, she is dialyzing with a catheter R IJ.   Rob Kelly Splinterpager [phone] [phone]/2015, 5:38 PM

## 2014-02-24 NOTE — H&P (Signed)
[facility] History and Physical  Isabel Sharp ZOX:[id number] DOB: 04-30-1971 DOA: 02/24/2014  Referring physician: Wandra Arthurs, MD PCP: No primary provider on file.   Chief Complaint: Weakness  HPI: Isabel Sharp is a 43 y.o. female past medical history of hypertension, CAD, diabetes and end-stage renal disease. Patient came in to the hospital because of generalized weakness. Patient does her hemodialysis at home, her nephrologist is in [place], [place]. Patient came in to the hospital because of generalized weakness and nausea/vomiting. Patient said last night she did not do dialysis but she felt tired, this morning she felt very nauseated to the point she vomited, she was weak to the point that she felt walking is a problem. She called 911 and came to the hospital for further evaluation. Please note that she had cardiac catheterization done at [facility] on 02/21/2014 by Dr. Duke Salvia and showed triple vessel disease and cardiology/CTS recommended CABG, which is scheduled on June 23. In the ED was found to have potassium of 7.9, BUN is 70 and creatinine 7.8. The POC troponin is positive with 0.59. Patient denies any chest pain or shortness of breath. Chest x-ray showed vascular congestion  Review of Systems:  Constitutional: negative for anorexia, fevers and sweats Eyes: negative for irritation, redness and visual disturbance Ears, nose, mouth, throat, and face: negative for earaches, epistaxis, nasal congestion and sore throat Respiratory: negative for cough, dyspnea on exertion, sputum and wheezing Cardiovascular: negative for chest pain, dyspnea, lower extremity edema, orthopnea, palpitations and syncope Gastrointestinal: Has nausea and vomiting, negative for abdominal pain, constipation, diarrhea, melena,   Genitourinary:negative for dysuria, frequency and hematuria Hematologic/lymphatic: negative for bleeding, easy bruising and lymphadenopathy Musculoskeletal:negative for  arthralgias, muscle weakness and stiff joints Neurological: negative for coordination problems, gait problems, headaches and weakness Endocrine: negative for diabetic symptoms including polydipsia, polyuria and weight loss Allergic/Immunologic: negative for anaphylaxis, hay fever and urticaria  Past Medical History  Diagnosis Date  . Dialysis patient   . Diabetes mellitus without complication   . Coronary artery disease    Past Surgical History  Procedure Laterality Date  . Cesarean section    . Laparoscopic bilateral salpingo oopherectomy    . Cardiac catheterization  02/21/14    done at [place] History:   reports that she has never smoked. She does not have any smokeless tobacco history on file. She reports that she does not drink alcohol or use illicit drugs.  Allergies  Allergen Reactions  . Norvasc [Amlodipine] Swelling    No family history on file.   Prior to Admission medications   Medication Sig Start Date End Date Taking? Authorizing Provider  acetaminophen (TYLENOL) 325 MG tablet Take 325 mg by mouth 2 (two) times daily as needed (pain).   Yes Historical Provider, MD  aspirin EC 81 MG tablet Take 81 mg by mouth daily.   Yes Historical Provider, MD  bumetanide (BUMEX) 2 MG tablet Take 2 mg by mouth daily.   Yes Historical Provider, MD  calcitRIOL (ROCALTROL) 0.25 MCG capsule Take 0.25 mcg by mouth 3 (three) times a week. Monday, Wednesday and Friday   Yes Historical Provider, MD  carvedilol (COREG) 25 MG tablet Take 25 mg by mouth 2 (two) times daily with a meal.   Yes Historical Provider, MD  diltiazem (CARDIZEM CD) 120 MG 24 hr capsule Take 360 mg by mouth daily.    Yes Historical Provider, MD  glipiZIDE (GLUCOTROL) 10 MG tablet Take 10-20 mg by mouth 2 (two)  times daily. Take 2 tablets (20 mg) by mouth every morning and 1 tablet (10 mg) at bedtime   Yes Historical Provider, MD  losartan (COZAAR) 25 MG tablet Take 25 mg by mouth at bedtime.    Yes Historical  Provider, MD  ondansetron (ZOFRAN) 4 MG tablet Take 4 mg by mouth daily as needed for nausea or vomiting.   Yes Historical Provider, MD  pravastatin (PRAVACHOL) 40 MG tablet Take 40 mg by mouth at bedtime.   Yes Historical Provider, MD  sevelamer carbonate (RENVELA) 800 MG tablet Take 2,400 mg by mouth 3 (three) times daily with meals.   Yes Historical Provider, MD  Vitamin D, Ergocalciferol, (DRISDOL) 50000 UNITS CAPS capsule Take 50,000 Units by mouth every 7 (seven) days. On Sundays   Yes Historical Provider, MD   Physical Exam: Filed Vitals:   02/24/14 1441  BP: 105/78  Pulse: 58  Resp: 18   Constitutional: Oriented to person, place, and time. Well-developed and well-nourished. Cooperative.  Head: Normocephalic and atraumatic.  Nose: Nose normal.  Mouth/Throat: Uvula is midline, oropharynx is clear and moist and mucous membranes are normal.  Eyes: Conjunctivae and EOM are normal. Pupils are equal, round, and reactive to light.  Neck: Trachea normal and normal range of motion. Neck supple.  Cardiovascular: Normal rate, regular rhythm, S1 normal, S2 normal, normal heart sounds and intact distal pulses.   Pulmonary/Chest: Effort normal and breath sounds normal.  Abdominal: Soft. Bowel sounds are normal. There is no hepatosplenomegaly. There is no tenderness.  Musculoskeletal: Normal range of motion.  Neurological: Alert and oriented to person, place, and time, has normal strength. No cranial nerve deficit or sensory deficit.  Skin: Skin is warm, dry and intact.  Psychiatric: Has a normal mood and affect. Speech is normal and behavior is normal.   Labs on Admission:  Basic Metabolic Panel:  Recent Labs Lab 02/24/14 1440 02/24/14 1459  NA 124* 125*  K >7.7* 7.9*  CL 88* 95*  CO2 21  --   GLUCOSE 255* 256*  BUN 63* 70*  CREATININE 7.04* 7.80*  CALCIUM 8.3*  --    Liver Function Tests:  Recent Labs Lab 02/24/14 1440  AST 15  ALT 20  ALKPHOS 83  BILITOT 0.4  PROT 6.9    ALBUMIN 3.1*   No results found for this basename: LIPASE, AMYLASE,  in the last 168 hours No results found for this basename: AMMONIA,  in the last 168 hours CBC:  Recent Labs Lab 02/24/14 1440 02/24/14 1459  WBC 8.9  --   NEUTROABS 7.5  --   HGB 11.0* 11.9*  HCT 33.1* 35.0*  MCV 94.6  --   PLT 187  --    Cardiac Enzymes: No results found for this basename: CKTOTAL, CKMB, CKMBINDEX, TROPONINI,  in the last 168 hours  BNP (last 3 results) No results found for this basename: PROBNP,  in the last 8760 hours CBG: No results found for this basename: GLUCAP,  in the last 168 hours  Radiological Exams on Admission: Dg Chest Portable 1 View  02/24/2014   CLINICAL DATA:  43 year old female with weakness, nausea and vomiting.  EXAM: PORTABLE CHEST - 1 VIEW  COMPARISON:  None.  FINDINGS: Cardiomegaly and mild pulmonary vascular congestion noted.  A right IJ catheter noted with tips overlying the cavoatrial junction and right atrium.  There is no evidence of focal airspace disease, pulmonary edema, suspicious pulmonary nodule/mass, pleural effusion, or pneumothorax. No acute bony abnormalities are identified.  IMPRESSION: Cardiomegaly with mild pulmonary vascular congestion   Electronically Signed   By: Hassan Rowan M.D.   On: 02/24/2014 14:51    EKG: Independently reviewed.   Assessment/Plan Principal Problem:   Hyperkalemia Active Problems:   ESRD (end stage renal disease)   HTN (hypertension)   DM type 2 causing renal disease   Nausea and vomiting   CAD (coronary artery disease)   Elevated troponin    Hyperkalemia -Nephrology consulted, patient for hemodialysis ASAP. -Patient mentioned that she was on Kayexalate weekly previously but she was taken off of that because of low potassium. -She might need to be back on Kayexalate, patient educated to avoid potassium high diet. -Patient has some widening in her QRS complex, given calcium gluconate, sodium bicarbonate. -Insulin also  given. Recheck 12-lead EKG in a.m.  Positive troponin/CAD -Per recent cardiac catheterization done in [facility] showed triple-vessel disease. -Patient denies any chest pain or shortness of breath, check serial troponin. -Cardiology consulted.  End stage renal disease -Nephrology for hemodialysis.  Nausea and vomiting -Denies any sick contacts, likely secondary to hyperkalemia. -Treat symptomatically with antiemetics, Reglan added.  Hypertension -Patient has history of malignant essential hypertension, who medications continued. -Blood pressure is in the high side, likely HD will help.  Diabetes mellitus type2 -Patient is on Glucotrol, hold oral hypoglycemic agents. -Started on carbohydrate modified diet and insulin sliding scale.   Code Status: Full code Family Communication: Plan discussed with the patient in the presence of her son at bedside. Disposition Plan: Stepdown.  Time spent: 70 minutes  [facility] Pager [phone]

## 2014-02-24 NOTE — ED Notes (Signed)
Pt transported to dialysis

## 2014-02-25 ENCOUNTER — Encounter (HOSPITAL_COMMUNITY): Payer: Self-pay | Admitting: *Deleted

## 2014-02-25 LAB — URINE CULTURE
COLONY COUNT: NO GROWTH
CULTURE: NO GROWTH

## 2014-02-25 LAB — CBC
HCT: 33.8 % — ABNORMAL LOW (ref 36.0–46.0)
HEMOGLOBIN: 11.1 g/dL — AB (ref 12.0–15.0)
MCH: 31.3 pg (ref 26.0–34.0)
MCHC: 32.8 g/dL (ref 30.0–36.0)
MCV: 95.2 fL (ref 78.0–100.0)
Platelets: 204 10*3/uL (ref 150–400)
RBC: 3.55 MIL/uL — ABNORMAL LOW (ref 3.87–5.11)
RDW: 15.1 % (ref 11.5–15.5)
WBC: 9 10*3/uL (ref 4.0–10.5)

## 2014-02-25 LAB — BASIC METABOLIC PANEL
BUN: 24 mg/dL — ABNORMAL HIGH (ref 6–23)
CO2: 27 meq/L (ref 19–32)
Calcium: 8.5 mg/dL (ref 8.4–10.5)
Chloride: 96 mEq/L (ref 96–112)
Creatinine, Ser: 4.34 mg/dL — ABNORMAL HIGH (ref 0.50–1.10)
GFR calc non Af Amer: 11 mL/min — ABNORMAL LOW (ref >90)
GFR, EST AFRICAN AMERICAN: 13 mL/min — AB (ref >90)
Glucose, Bld: 80 mg/dL (ref 70–99)
POTASSIUM: 4.4 meq/L (ref 3.7–5.3)
Sodium: 138 mEq/L (ref 137–147)

## 2014-02-25 LAB — TROPONIN I
TROPONIN I: 1.62 ng/mL — AB (ref <0.30)
TROPONIN I: 8.03 ng/mL — AB (ref <0.30)
Troponin I: 0.54 ng/mL (ref <0.30)
Troponin I: 6.69 ng/mL (ref <0.30)

## 2014-02-25 LAB — GLUCOSE, CAPILLARY
GLUCOSE-CAPILLARY: 85 mg/dL (ref 70–99)
GLUCOSE-CAPILLARY: 157 mg/dL — AB (ref 70–99)
Glucose-Capillary: 108 mg/dL — ABNORMAL HIGH (ref 70–99)
Glucose-Capillary: 195 mg/dL — ABNORMAL HIGH (ref 70–99)

## 2014-02-25 LAB — HEMOGLOBIN A1C
Hgb A1c MFr Bld: 7 % — ABNORMAL HIGH (ref <5.7)
MEAN PLASMA GLUCOSE: 154 mg/dL — AB (ref <117)

## 2014-02-25 LAB — MRSA PCR SCREENING: MRSA by PCR: NEGATIVE

## 2014-02-25 LAB — TSH: TSH: 1.9 u[IU]/mL (ref 0.350–4.500)

## 2014-02-25 LAB — PRO B NATRIURETIC PEPTIDE: Pro B Natriuretic peptide (BNP): 17255 pg/mL — ABNORMAL HIGH (ref 0–125)

## 2014-02-25 MED ORDER — ACETAMINOPHEN 325 MG PO TABS
ORAL_TABLET | ORAL | Status: AC
  Administered 2014-02-25: 325 mg via ORAL
  Filled 2014-02-25: qty 2

## 2014-02-25 MED ORDER — ACETAMINOPHEN 325 MG PO TABS
325.0000 mg | ORAL_TABLET | Freq: Two times a day (BID) | ORAL | Status: DC | PRN
  Administered 2014-02-25: 325 mg via ORAL
  Filled 2014-02-25: qty 1

## 2014-02-25 NOTE — Progress Notes (Signed)
[name]  Isabel Sharp ZOX:[id number] DOB: [birth year] DOA: 02/24/2014 PCP: No primary provider on file.  Admit HPI / Brief Narrative: 43 y.o. F w/ a history of hypertension, CAD, diabetes and end-stage renal disease. Patient came in to the hospital because of generalized weakness with n/v. Patient does her hemodialysis at home - her nephrologist is in [place], [place]. Patient admitted she missed a tx and then she became weak to the point that she felt walking was a problem. She called 911 and came to the hospital for further evaluation. In the ED she was found to have a potassium of 7.9, BUN 70, and creatinine 7.8. The POC troponin was 0.59.   Please note that she had cardiac catheterization done at [facility] on 02/21/2014 by Dr. Duke Salvia which showed triple vessel disease and Cardiology/CTS recommended CABG, which was scheduled for June 23.   HPI/Subjective: No new complaints.  Denies cp, sob, n/v, or abdom pain.    Assessment/Plan:  Emergent hyperkalemia Required emergency HD - likely due to diet high in potassium - has been counseled by Nutritionist   Severe 3 vessel CAD - positive troponin Cardiology following - troponin bumped significantly today - Cardiology following - will assure troponin is declining before d/c home   ESRD Awaiting planned transplant   HTN BP currently well controlled   DM CBG reasonably controlled - follow trend   Code Status: FULL Family Communication: no family present at time of exam Disposition Plan: SDU   Consultants: Cardiology [facility]) Nephrology ([place])  Procedures: none  Antibiotics: none  DVT prophylaxis: SQ heparin  Objective: Blood pressure 132/54, pulse 69, temperature 98.4 F (36.9 C), temperature source Oral, resp. rate 20, height $RemoveBe'5\' 2"'nLCFBDSfC$  (1.575 m), weight 72.9 kg (160 lb 11.5 oz), SpO2 96.00%.  Intake/Output Summary (Last 24 hours) at 02/25/14 1514 Last data filed at  02/25/14 0800  Gross per 24 hour  Intake    120 ml  Output   2700 ml  Net  -2580 ml   Exam: General: No acute respiratory distress Lungs: Clear to auscultation bilaterally without wheezes or crackles Cardiovascular: Regular rate and rhythm without murmur gallop or rub normal S1 and S2 Abdomen: Nontender, nondistended, soft, bowel sounds positive, no rebound, no ascites, no appreciable mass Extremities: No significant cyanosis, clubbing, or edema bilateral lower extremities  Data Reviewed: Basic Metabolic Panel:  Recent Labs Lab 02/24/14 1440 02/24/14 1459 02/24/14 1720 02/25/14 0730  NA 124* 125* 127* 138  K >7.7* 7.9* 6.5* 4.4  CL 88* 95* 89* 96  CO2 21  --  23 27  GLUCOSE 255* 256* 260* 80  BUN 63* 70* 67* 24*  CREATININE 7.04* 7.80* 7.08* 4.34*  CALCIUM 8.3*  --  8.9 8.5  PHOS  --   --  6.5*  --    Liver Function Tests:  Recent Labs Lab 02/24/14 1440 02/24/14 1720  AST 15  --   ALT 20  --   ALKPHOS 83  --   BILITOT 0.4  --   PROT 6.9  --   ALBUMIN 3.1* 3.1*   CBC:  Recent Labs Lab 02/24/14 1440 02/24/14 1459 02/25/14 0730  WBC 8.9  --  9.0  NEUTROABS 7.5  --   --   HGB 11.0* 11.9* 11.1*  HCT 33.1* 35.0* 33.8*  MCV 94.6  --  95.2  PLT 187  --  204   Cardiac Enzymes:  Recent Labs Lab 02/25/14 0100 02/25/14 0730 02/25/14  1310  TROPONINI 0.54* 1.62* 6.69*   BNP (last 3 results)  Recent Labs  02/25/14 0100  PROBNP 17255.0*   CBG:  Recent Labs Lab 02/24/14 2207 02/25/14 0737 02/25/14 1132  GLUCAP 75 85 157*    Recent Results (from the past 240 hour(s))  MRSA PCR SCREENING     Status: None   Collection Time    02/25/14 12:08 AM      Result Value Ref Range Status   MRSA by PCR NEGATIVE  NEGATIVE Final   Comment:            The GeneXpert MRSA Assay (FDA     approved for NASAL specimens     only), is one component of a     comprehensive MRSA colonization     surveillance program. It is not     intended to diagnose MRSA      infection nor to guide or     monitor treatment for     MRSA infections.     Studies:  Recent x-ray studies have been reviewed in detail by the Attending Physician  Scheduled Meds:  Scheduled Meds: . aspirin EC  81 mg Oral Daily  . calcitRIOL  0.25 mcg Oral Once per day on Mon Wed Fri  . calcitRIOL  0.25 mcg Oral Daily  . carvedilol  25 mg Oral BID WC  . diltiazem  360 mg Oral Daily  . [START ON 02/27/2014] ferric gluconate (FERRLECIT/NULECIT) IV  62.5 mg Intravenous Q Wed-HD  . heparin  5,000 Units Subcutaneous 3 times per day  . insulin aspart  0-9 Units Subcutaneous TID WC  . losartan  25 mg Oral QHS  . multivitamin  1 tablet Oral QHS  . sevelamer carbonate  2,400 mg Oral TID WC  . sodium chloride  3 mL Intravenous Q12H  . Vitamin D (Ergocalciferol)  50,000 Units Oral Q7 days    Time spent on care of this patient: 35 mins   Cherene Altes , MD   [facility] Office  [phone] Pager - Text Page per Amion as per below:  On-Call/Text Page:      [email or web address]      password [email or web address]  If 7PM-7AM, please contact night-coverage [email or web address] Password [email or web address] 02/25/2014, 3:14 PM   LOS: 1 day

## 2014-02-25 NOTE — Progress Notes (Addendum)
Will review echocardiogram if performed today to evaluate for pericardial effusion or new WMA's, however, suspicion for these is not high. She feels much better today. Sitting up reading the newspaper. RRR, s1/s2, lungs clear, no edema. Potassium is now 4.4.  There was a small elevation in troponin to 1.62 (from 0.54), which is suspect is demand ischemia in the setting of known 3V CAD. She has no chest pain. I would continue current medical therapy.   Pixie Casino, MD, [facility] Attending Cardiologist [place]

## 2014-02-25 NOTE — Progress Notes (Signed)
Subjective: Interval History: has complaints wants to see dietician to go over diet esp with ethnic [place] slant.  Objective: Vital signs in last 24 hours: Temp:  [97.5 F (36.4 C)-99.3 F (37.4 C)] 98.6 F (37 C) (05/25 0700) Pulse Rate:  [57-83] 81 (05/25 0744) Resp:  [15-21] 21 (05/25 0744) BP: (105-187)/(69-93) 170/77 mmHg (05/25 0741) SpO2:  [95 %-100 %] 96 % (05/25 0744) Weight:  [72.9 kg (160 lb 11.5 oz)-75.1 kg (165 lb 9.1 oz)] 72.9 kg (160 lb 11.5 oz) (05/24 2205) Weight change:   Intake/Output from previous day: 05/24 0701 - 05/25 0700 In: -  Out: 2700 [Urine:700] Intake/Output this shift: Total I/O In: 120 [P.O.:120] Out: -   General appearance: alert, cooperative and no distress Resp: clear to auscultation bilaterally Chest wall: PC Cardio: S1, S2 normal and systolic murmur: holosystolic 2/6, blowing at apex GI: soft, non-tender; bowel sounds normal; no masses,  no organomegaly Extremities: AVF LUA  Lab Results:  Recent Labs  02/24/14 1440 02/24/14 1459 02/25/14 0730  WBC 8.9  --  9.0  HGB 11.0* 11.9* 11.1*  HCT 33.1* 35.0* 33.8*  PLT 187  --  204   BMET:  Recent Labs  02/24/14 1720 02/25/14 0730  NA 127* 138  K 6.5* 4.4  CL 89* 96  CO2 23 27  GLUCOSE 260* 80  BUN 67* 24*  CREATININE 7.08* 4.34*  CALCIUM 8.9 8.5   No results found for this basename: PTH,  in the last 72 hours Iron Studies: No results found for this basename: IRON, TIBC, TRANSFERRIN, FERRITIN,  in the last 72 hours  Studies/Results: Dg Chest Portable 1 View  02/24/2014   CLINICAL DATA:  43 year old female with weakness, nausea and vomiting.  EXAM: PORTABLE CHEST - 1 VIEW  COMPARISON:  None.  FINDINGS: Cardiomegaly and mild pulmonary vascular congestion noted.  A right IJ catheter noted with tips overlying the cavoatrial junction and right atrium.  There is no evidence of focal airspace disease, pulmonary edema, suspicious pulmonary nodule/mass, pleural effusion, or  pneumothorax. No acute bony abnormalities are identified.  IMPRESSION: Cardiomegaly with mild pulmonary vascular congestion   Electronically Signed   By: Hassan Rowan M.D.   On: 02/24/2014 14:51    I have reviewed the patient's current medications.  Assessment/Plan: 1 ESRD K ok.  Diet issues, shortened tx, and not full number of tx.  Ideally 5 tx/wk. 2 Anemia 3 DM controlled 4 HPTH P can d/c to home from our standpoint, dietician to see    LOS: 1 day   Shanise Balch L Tonishia Steffy 02/25/2014,10:37 AM

## 2014-02-25 NOTE — Procedures (Signed)
I was present at this session.  I have reviewed the session itself and made appropriate changes.  Hd via cath.  tol well.  bp 120s Joyice Faster Maher Shon 5/25/20153:35 PM

## 2014-02-25 NOTE — Progress Notes (Signed)
Nutrition Education Note  RD consulted for Renal Education. Dietary recall reveals very high potassium containing foods such as: kale, spinach and tomatoes.  Explained why diet restrictions are needed and provided lists of foods to limit/avoid that are high potassium, sodium, and phosphorus. Provided specific recommendations on safer alternatives of these foods. Strongly encouraged compliance of this diet. Teach back method used.  Expect good compliance.  Body mass index is 29.39 kg/(m^2). Pt meets criteria for overweight based on current BMI.  Current diet order is Renal with Carbohydrate Modified Medium, patient is consuming approximately >50% of meals at this time. Labs and medications reviewed. No further nutrition interventions warranted at this time. RD contact information provided. If additional nutrition issues arise, please re-consult RD.  Inda Coke MS, RD, LDN Inpatient Registered Dietitian Pager: [phone] After-hours pager: [phone]

## 2014-02-26 DIAGNOSIS — I517 Cardiomegaly: Secondary | ICD-10-CM

## 2014-02-26 LAB — BASIC METABOLIC PANEL
BUN: 20 mg/dL (ref 6–23)
CALCIUM: 9 mg/dL (ref 8.4–10.5)
CO2: 25 mEq/L (ref 19–32)
CREATININE: 3.41 mg/dL — AB (ref 0.50–1.10)
Chloride: 95 mEq/L — ABNORMAL LOW (ref 96–112)
GFR, EST AFRICAN AMERICAN: 18 mL/min — AB (ref >90)
GFR, EST NON AFRICAN AMERICAN: 15 mL/min — AB (ref >90)
Glucose, Bld: 120 mg/dL — ABNORMAL HIGH (ref 70–99)
Potassium: 3.8 mEq/L (ref 3.7–5.3)
Sodium: 134 mEq/L — ABNORMAL LOW (ref 137–147)

## 2014-02-26 LAB — GLUCOSE, CAPILLARY
GLUCOSE-CAPILLARY: 148 mg/dL — AB (ref 70–99)
Glucose-Capillary: 112 mg/dL — ABNORMAL HIGH (ref 70–99)
Glucose-Capillary: 178 mg/dL — ABNORMAL HIGH (ref 70–99)

## 2014-02-26 LAB — TROPONIN I: TROPONIN I: 7.16 ng/mL — AB (ref <0.30)

## 2014-02-26 MED ORDER — BISACODYL 5 MG PO TBEC
5.0000 mg | DELAYED_RELEASE_TABLET | Freq: Every day | ORAL | Status: DC | PRN
  Administered 2014-02-26: 5 mg via ORAL
  Filled 2014-02-26: qty 1

## 2014-02-26 MED ORDER — ATORVASTATIN CALCIUM 80 MG PO TABS
80.0000 mg | ORAL_TABLET | Freq: Every day | ORAL | Status: DC
  Administered 2014-02-26: 80 mg via ORAL
  Filled 2014-02-26: qty 1

## 2014-02-26 MED ORDER — DIPHENHYDRAMINE HCL 50 MG/ML IJ SOLN
25.0000 mg | Freq: Once | INTRAMUSCULAR | Status: AC
  Administered 2014-02-26: 25 mg via INTRAVENOUS
  Filled 2014-02-26: qty 1

## 2014-02-26 MED ORDER — MENTHOL 3 MG MT LOZG
1.0000 | LOZENGE | OROMUCOSAL | Status: DC | PRN
  Filled 2014-02-26: qty 9

## 2014-02-26 MED ORDER — METOCLOPRAMIDE HCL 5 MG/ML IJ SOLN
10.0000 mg | Freq: Once | INTRAMUSCULAR | Status: AC
  Administered 2014-02-26: 10 mg via INTRAVENOUS
  Filled 2014-02-26: qty 2

## 2014-02-26 MED ORDER — HEPARIN (PORCINE) IN NACL 100-0.45 UNIT/ML-% IJ SOLN: 750.0000 [IU]/h | INTRAMUSCULAR | Status: DC

## 2014-02-26 MED ORDER — INSULIN ASPART 100 UNIT/ML ~~LOC~~ SOLN: 0.0000 [IU] | Freq: Three times a day (TID) | SUBCUTANEOUS | Status: DC

## 2014-02-26 MED ORDER — HEPARIN (PORCINE) IN NACL 100-0.45 UNIT/ML-% IJ SOLN
750.0000 [IU]/h | INTRAMUSCULAR | Status: DC
  Administered 2014-02-26: 750 [IU]/h via INTRAVENOUS
  Filled 2014-02-26 (×2): qty 250

## 2014-02-26 NOTE — Progress Notes (Signed)
Subjective: Interval History: has no complaint.  Objective: Vital signs in last 24 hours: Temp:  [97.9 F (36.6 C)-99.3 F (37.4 C)] 98.2 F (36.8 C) (05/26 0700) Pulse Rate:  [52-96] 72 (05/26 0735) Resp:  [16-29] 22 (05/26 0735) BP: (125-157)/(54-93) 157/69 mmHg (05/26 0734) SpO2:  [93 %-100 %] 99 % (05/26 0735) Weight:  [71.5 kg (157 lb 10.1 oz)-72.9 kg (160 lb 11.5 oz)] 71.5 kg (157 lb 10.1 oz) (05/25 1746) Weight change: -2.2 kg (-4 lb 13.6 oz)  Intake/Output from previous day: 05/25 0701 - 05/26 0700 In: 1200 [P.O.:1200] Out: 2402 [Urine:400] Intake/Output this shift: Total I/O In: 100 [P.O.:100] Out: -   General appearance: alert and cooperative Resp: clear to auscultation bilaterally Chest wall: IJ cath Cardio: S1, S2 normal and systolic murmur: holosystolic 2/6, blowing at apex GI: pos bs, liver down 4 cm, soft Extremities: edema none and AVF LUA new  Lab Results:  Recent Labs  02/24/14 1440 02/24/14 1459 02/25/14 0730  WBC 8.9  --  9.0  HGB 11.0* 11.9* 11.1*  HCT 33.1* 35.0* 33.8*  PLT 187  --  204   BMET:  Recent Labs  02/25/14 0730 02/26/14 0728  NA 138 134*  K 4.4 3.8  CL 96 95*  CO2 27 25  GLUCOSE 80 120*  BUN 24* 20  CREATININE 4.34* 3.41*  CALCIUM 8.5 9.0   No results found for this basename: PTH,  in the last 72 hours Iron Studies: No results found for this basename: IRON, TIBC, TRANSFERRIN, FERRITIN,  in the last 72 hours  Studies/Results: Dg Chest Portable 1 View  02/24/2014   CLINICAL DATA:  43 year old female with weakness, nausea and vomiting.  EXAM: PORTABLE CHEST - 1 VIEW  COMPARISON:  None.  FINDINGS: Cardiomegaly and mild pulmonary vascular congestion noted.  A right IJ catheter noted with tips overlying the cavoatrial junction and right atrium.  There is no evidence of focal airspace disease, pulmonary edema, suspicious pulmonary nodule/mass, pleural effusion, or pneumothorax. No acute bony abnormalities are identified.   IMPRESSION: Cardiomegaly with mild pulmonary vascular congestion   Electronically Signed   By: Hassan Rowan M.D.   On: 02/24/2014 14:51    I have reviewed the patient's current medications.  Assessment/Plan: 1 ESRD for HD in am. Some xs vol yet 2 HTN cont to lower vol 3 CAD cont ^ Trop 4 Anemia stable 5 HPTH vit D 6 DM controlled P HD in am, bp meds, lower vol, counseled diet, follow Trop    LOS: 2 days   Amad Mau L Christen Bedoya 02/26/2014,9:14 AM

## 2014-02-26 NOTE — Progress Notes (Signed)
Patient Name: Isabel Sharp Date of Encounter: 02/26/2014     Principal Problem:   Hyperkalemia Active Problems:   ESRD (end stage renal disease)   HTN (hypertension)   DM type 2 causing renal disease   Nausea and vomiting   CAD (coronary artery disease)   Elevated troponin    SUBJECTIVE  Denies any chest pain, shortness of breath or dizziness. Did have some headache and some constipation last night per nursing staff.   CURRENT MEDS . aspirin EC  81 mg Oral Daily  . calcitRIOL  0.25 mcg Oral Once per day on Mon Wed Fri  . calcitRIOL  0.25 mcg Oral Daily  . carvedilol  25 mg Oral BID WC  . diltiazem  360 mg Oral Daily  . [START ON 02/27/2014] ferric gluconate (FERRLECIT/NULECIT) IV  62.5 mg Intravenous Q Wed-HD  . heparin  5,000 Units Subcutaneous 3 times per day  . insulin aspart  0-9 Units Subcutaneous TID WC  . losartan  25 mg Oral QHS  . multivitamin  1 tablet Oral QHS  . sevelamer carbonate  2,400 mg Oral TID WC  . sodium chloride  3 mL Intravenous Q12H  . Vitamin D (Ergocalciferol)  50,000 Units Oral Q7 days    OBJECTIVE  Filed Vitals:   02/26/14 0700 02/26/14 0733 02/26/14 0734 02/26/14 0735  BP:  157/69 157/69   Pulse:  73 70 72  Temp: 98.2 F (36.8 C)     TempSrc: Oral     Resp:  $Remo'23 24 22  'lwbIT$ Height:      Weight:      SpO2:  93% 97% 99%    Intake/Output Summary (Last 24 hours) at 02/26/14 0921 Last data filed at 02/26/14 0800  Gross per 24 hour  Intake   1180 ml  Output   2402 ml  Net  -1222 ml   Filed Weights   02/25/14 0145 02/25/14 1345 02/25/14 1746  Weight: 160 lb 11.5 oz (72.9 kg) 160 lb 11.5 oz (72.9 kg) 157 lb 10.1 oz (71.5 kg)    PHYSICAL EXAM  General: Pleasant, NAD. Sitting comfortably in chair Neuro: Alert and oriented X 3. Moves all extremities spontaneously. Psych: Normal affect. HEENT:  Normal  Neck: Supple without bruits or JVD. Lungs:  Resp regular and unlabored, CTA. HD catheter in place over R pectoral region Heart: RRR  no s3, s4. 2/6 systolic murmur at apex Abdomen: Soft, non-tender, non-distended, BS + x 4.  Extremities: No clubbing, cyanosis or edema.   Accessory Clinical Findings  CBC  Recent Labs  02/24/14 1440 02/24/14 1459 02/25/14 0730  WBC 8.9  --  9.0  NEUTROABS 7.5  --   --   HGB 11.0* 11.9* 11.1*  HCT 33.1* 35.0* 33.8*  MCV 94.6  --  95.2  PLT 187  --  924   Basic Metabolic Panel  Recent Labs  02/24/14 1459 02/24/14 1720 02/25/14 0730 02/26/14 0728  NA 125* 127* 138 134*  K 7.9* 6.5* 4.4 3.8  CL 95* 89* 96 95*  CO2  --  $R'23 27 25  'Sf$ GLUCOSE 256* 260* 80 120*  BUN 70* 67* 24* 20  CREATININE 7.80* 7.08* 4.34* 3.41*  CALCIUM  --  8.9 8.5 9.0  PHOS  --  6.5*  --   --    Liver Function Tests  Recent Labs  02/24/14 1440 02/24/14 1720  AST 15  --   ALT 20  --   ALKPHOS 83  --   BILITOT  0.4  --   PROT 6.9  --   ALBUMIN 3.1* 3.1*   No results found for this basename: LIPASE, AMYLASE,  in the last 72 hours Cardiac Enzymes  Recent Labs  02/25/14 1310 02/25/14 1900 02/26/14 0728  TROPONINI 6.69* 8.03* 7.16*   Hemoglobin A1C  Recent Labs  02/25/14 0100  HGBA1C 7.0*   Thyroid Function Tests  Recent Labs  02/25/14 0100  TSH 1.900    TELE  NSR with HR 70s, no significant ventricular ectopy  ECG  02/26/2014 NSR with HR 75, prolonged QTc, t wave inversion in lead I and aVL  Radiology/Studies  Dg Chest Portable 1 View  02/24/2014   CLINICAL DATA:  43 year old female with weakness, nausea and vomiting.  EXAM: PORTABLE CHEST - 1 VIEW  COMPARISON:  None.  FINDINGS: Cardiomegaly and mild pulmonary vascular congestion noted.  A right IJ catheter noted with tips overlying the cavoatrial junction and right atrium.  There is no evidence of focal airspace disease, pulmonary edema, suspicious pulmonary nodule/mass, pleural effusion, or pneumothorax. No acute bony abnormalities are identified.  IMPRESSION: Cardiomegaly with mild pulmonary vascular congestion    Electronically Signed   By: Hassan Rowan M.D.   On: 02/24/2014 14:51    ASSESSMENT AND PLAN  1. Coronary artery disease  - abnormal stress test as part of pre-renal transplant workup  - Cath 02/21/2014 by Dr. Duke Salvia at [facility]  revealed triple vessel disease  - Plan on CABG by Dr. Glenda Chroman at [facility] on Jun 23  - currently on Losartan, Dilt and Coreg  - troponin peaked 8.03, currently trending down to 7.16, ?if due to combination of underlying ischemia and ESRD (denies CP or SOB, weakness improved after dialysis) - will discuss with Dr. Stanford Breed  - plan for Echo today, will follow up with result  2. ESRD  - Nephrology following, plan to change to 5 tx/wk schedule for better volume control  - K >7 on arrival, improved after 2 dialysis, K 3.41  3. Hyperkalemia  - seen by Dietician, will avoid high K food  - Hyperkalemic EKG changes improving after dialysis  4. Prolonged QTc  - have not needed Zofran during admission  - no other significant QT prolonging medication  - slightly improved on EKG this am, continue to monitor  5. HTN, controlled with SBP 120-150s  6. DM  Signed, Almyra Deforest PA Pager [phone] As above, patient seen and examined; she denies chest pain or dyspnea. Troponin has increased to 8. Recent cath shows severe 3 vessel CAD and CABG was scheduled electively at [facility] for June. Given NSTEMI, feel CABG should be performed now. Will try and arrange transfer. Once patient revascularized, she will be evaluated for renal transplant. Continue ASA and beta blocker; add statin. Change to IV heparin. Await echocardiogram. Lelon Perla Discussed with Dr Berneice Gandy; he will arrange transfer later today. Lelon Perla

## 2014-02-26 NOTE — Progress Notes (Addendum)
Report called to Wilfred Curtis, RN, at [facility].  All questions answered.  Patient aware of transfer  and signed transfer  form.  Will notify [facility].

## 2014-02-26 NOTE — Progress Notes (Signed)
Discharged with [facility] from [facility] without complaints. Husband aware and will meet ambulance at [facility].

## 2014-02-26 NOTE — Discharge Summary (Signed)
[place] TEAM 1 - Stepdown/ICU TEAM Progress Note  Isabel Sharp EXB:[id number] DOB: 1971/08/24 DOA: 02/24/2014 PCP: No primary provider on file.  Admit HPI / Brief Narrative: Isabel Sharp is a 43 y.o. female PMHx HTN, CAD, DM Type 2, diabetes.   Patient came in to the hospital because of generalized weakness. Patient does her hemodialysis at home, her nephrologist is in [place], [place]. Patient came in to the hospital because of generalized weakness and nausea/vomiting. Patient said last night she did not do dialysis but she felt tired, this morning she felt very nauseated to the point she vomited, she was weak to the point that she felt walking is a problem. She called 911 and came to the hospital for further evaluation. Please note that she had cardiac catheterization done at [facility] on 02/21/2014 by Dr. Duke Salvia and showed triple vessel disease and cardiology/CTS recommended CABG, which is scheduled on June 23.  In the ED was found to have potassium of 7.9, BUN is 70 and creatinine 7.8. The POC troponin is positive with 0.59. Patient denies any chest pain or shortness of breath. Chest x-ray showed vascular congestion   HPI/Subjective: 5/26 states negative CP/SOB. States aware of plan to be transferred to [place] for CABG  Assessment/Plan: Coronary artery disease  - abnormal stress test as part of pre-renal transplant workup  - Cath 02/21/2014 by Dr. Duke Salvia at [facility] revealed triple vessel disease  - 5/26 transferred to [place] for CABG by Dr. Glenda Chroman   - Discharge on Losartan, Dilt and Coreg  - troponin peaked 8.03, currently trending down to 7.16, ?if due to combination of underlying ischemia and ESRD (denies CP or SOB, weakness improved after dialysis) -  ESRD  - Nephrology following, plan to change to 5 tx/wk schedule for better volume control  - K >7 on arrival, improved after 2 dialysis, K 3.41   Hyperkalemia  - seen by Dietician, will  avoid high K food  - Hyperkalemic EKG changes improving after dialysis   Prolonged QTc  - have not needed Zofran during admission  - no other significant QT prolonging medication  - slightly improved on EKG this am, continue to monitor   HTN,  -controlled with SBP 120-150s   DM type2 -Continue patient on sensitive SSI    Code Status: FULL Family Communication: no family present at time of exam Disposition Plan: Transfer to [place] for CABG   Consultants: Dr. [facility] (Primary Cardiologist) Dr [facility](Czardiologist) [facility]   Procedure/Significant Events: 02/21/2014 transferred [facility] where she underwent cardiac catheterization  Demonstrated three-vessel coronary artery disease -LAD 90%, proximal diagonal 95%, left circumflex 75% proximal with OM1 90%, right coronary artery 95% proximal 5/24 PCXR;Cardiomegaly with mild pulmonary vascular congestion 5/25 elevated troponin reached a high of 8.03    Culture NA  Antibiotics: NA  DVT prophylaxis: Heparin drip   Devices NA   LINES / TUBES:  5/24 20 Ga Rt Antecubital    Continuous Infusions: . heparin      Objective: VITAL SIGNS: Temp: 98.2 F (36.8 C) (05/26 0700) Temp src: Oral (05/26 0700) BP: 157/69 mmHg (05/26 0734) Pulse Rate: 72 (05/26 0735) SPO2;99% on RA FIO2:   Intake/Output Summary (Last 24 hours) at 02/26/14 1103 Last data filed at 02/26/14 0800  Gross per 24 hour  Intake    940 ml  Output   2402 ml  Net  -1462 ml     Exam: General: A/O  x 4, NAD,No acute respiratory distress Lungs: Clear to auscultation bilaterally without wheezes or crackles Cardiovascular: Regular rate and rhythm without murmur gallop or rub normal S1 and S2 Abdomen: Nontender, nondistended, soft, bowel sounds positive, no rebound, no ascites, no appreciable mass Extremities: No significant cyanosis, clubbing, or edema bilateral  lower extremities  Data Reviewed: Basic Metabolic Panel:  Recent Labs Lab 02/24/14 1440 02/24/14 1459 02/24/14 1720 02/25/14 0730 02/26/14 0728  NA 124* 125* 127* 138 134*  K >7.7* 7.9* 6.5* 4.4 3.8  CL 88* 95* 89* 96 95*  CO2 21  --  $R'23 27 25  'eB$ GLUCOSE 255* 256* 260* 80 120*  BUN 63* 70* 67* 24* 20  CREATININE 7.04* 7.80* 7.08* 4.34* 3.41*  CALCIUM 8.3*  --  8.9 8.5 9.0  PHOS  --   --  6.5*  --   --    Liver Function Tests:  Recent Labs Lab 02/24/14 1440 02/24/14 1720  AST 15  --   ALT 20  --   ALKPHOS 83  --   BILITOT 0.4  --   PROT 6.9  --   ALBUMIN 3.1* 3.1*   No results found for this basename: LIPASE, AMYLASE,  in the last 168 hours No results found for this basename: AMMONIA,  in the last 168 hours CBC:  Recent Labs Lab 02/24/14 1440 02/24/14 1459 02/25/14 0730  WBC 8.9  --  9.0  NEUTROABS 7.5  --   --   HGB 11.0* 11.9* 11.1*  HCT 33.1* 35.0* 33.8*  MCV 94.6  --  95.2  PLT 187  --  204   Cardiac Enzymes:  Recent Labs Lab 02/25/14 0100 02/25/14 0730 02/25/14 1310 02/25/14 1900 02/26/14 0728  TROPONINI 0.54* 1.62* 6.69* 8.03* 7.16*   BNP (last 3 results)  Recent Labs  02/25/14 0100  PROBNP 17255.0*   CBG:  Recent Labs Lab 02/25/14 0737 02/25/14 1132 02/25/14 1921 02/25/14 2118 02/26/14 0734  GLUCAP 85 157* 108* 195* 112*    Recent Results (from the past 240 hour(s))  URINE CULTURE     Status: None   Collection Time    02/24/14 11:22 PM      Result Value Ref Range Status   Specimen Description URINE, CLEAN CATCH   Final   Special Requests NONE   Final   Culture  Setup Time     Final   Value: 02/25/2014 00:39     Performed at [facility]     Final   Value: NO GROWTH     Performed at [facility]   Culture     Final   Value: NO GROWTH     Performed at [facility]   Report Status 02/25/2014 FINAL   Final  MRSA PCR SCREENING     Status: None   Collection Time    02/25/14 12:08  AM      Result Value Ref Range Status   MRSA by PCR NEGATIVE  NEGATIVE Final   Comment:            The GeneXpert MRSA Assay (FDA     approved for NASAL specimens     only), is one component of a     comprehensive MRSA colonization     surveillance program. It is not     intended to diagnose MRSA     infection nor to guide or     monitor treatment for     MRSA infections.  Studies:  Recent x-ray studies have been reviewed in detail by the Attending Physician  Scheduled Meds:  Scheduled Meds: . aspirin EC  81 mg Oral Daily  . atorvastatin  80 mg Oral q1800  . calcitRIOL  0.25 mcg Oral Once per day on Mon Wed Fri  . calcitRIOL  0.25 mcg Oral Daily  . carvedilol  25 mg Oral BID WC  . diltiazem  360 mg Oral Daily  . [START ON 02/27/2014] ferric gluconate (FERRLECIT/NULECIT) IV  62.5 mg Intravenous Q Wed-HD  . insulin aspart  0-9 Units Subcutaneous TID WC  . losartan  25 mg Oral QHS  . multivitamin  1 tablet Oral QHS  . sevelamer carbonate  2,400 mg Oral TID WC  . sodium chloride  3 mL Intravenous Q12H  . Vitamin D (Ergocalciferol)  50,000 Units Oral Q7 days    Time spent on care of this patient: 40 mins   Allie Bossier , MD   [facility] Office  [phone] Pager [phone]  On-Call/Text Page:      [email or web address]      password [email or web address]  If 7PM-7AM, please contact night-coverage [email or web address] Password [email or web address] 02/26/2014, 11:03 AM   LOS: 2 days

## 2014-02-26 NOTE — Progress Notes (Signed)
ANTICOAGULATION CONSULT NOTE - Follow Up Consult  Pharmacy Consult:  Heparin Indication: chest pain/ACS  Allergies  Allergen Reactions  . Norvasc [Amlodipine] Swelling    Patient Measurements: Height: 5\' 2"  (157.5 cm) Weight: 157 lb 10.1 oz (71.5 kg) IBW/kg (Calculated) : 50.1 Heparin Dosing Weight: 65 kg  Vital Signs: Temp: 99.1 F (37.3 C) (05/26 1300) Temp src: Oral (05/26 1300) BP: 157/69 mmHg (05/26 0734) Pulse Rate: 72 (05/26 0735)  Labs:  Recent Labs  02/24/14 1440 02/24/14 1459 02/24/14 1720  02/25/14 0730 02/25/14 1310 02/25/14 1900 02/26/14 0728  HGB 11.0* 11.9*  --   --  11.1*  --   --   --   HCT 33.1* 35.0*  --   --  33.8*  --   --   --   PLT 187  --   --   --  204  --   --   --   CREATININE 7.04* 7.80* 7.08*  --  4.34*  --   --  3.41*  TROPONINI  --   --   --   < > 1.62* 6.69* 8.03* 7.16*  < > = values in this interval not displayed.  Estimated Creatinine Clearance: 19.3 ml/min (by C-G formula based on Cr of 3.41).     Assessment: 33 YOF with history of CAD to undergo CABG at [facility] in June.  Pharmacy consulted to manage IV heparin for ACS.  Baseline labs reviewed.  Aware patient received heparin SQ this AM.   Goal of Therapy:  Heparin level 0.3-0.7 units/ml Monitor platelets by anticoagulation protocol: Yes    Plan:  - Heparin gtt at 750 units/hr, no bolus - Check 8 hr HL - Daily HL / CBC    Custer Pimenta D. Mina Marble, PharmD, BCPS Pager:  [phone] 02/26/2014, 1:32 PM

## 2014-02-26 NOTE — Progress Notes (Signed)
[facility] called for pickup time of patient.  States will probably be after 7 pm. Patient and family aware.

## 2014-02-26 NOTE — Progress Notes (Signed)
  Echocardiogram 2D Echocardiogram has been performed.  Valinda Hoar 02/26/2014, 10:39 AM

## 2014-04-04 ENCOUNTER — Ambulatory Visit (HOSPITAL_COMMUNITY): Payer: BC Managed Care – PPO

## 2014-04-08 ENCOUNTER — Encounter (HOSPITAL_COMMUNITY): Payer: BC Managed Care – PPO

## 2014-04-10 ENCOUNTER — Encounter (HOSPITAL_COMMUNITY): Payer: BC Managed Care – PPO

## 2014-04-12 ENCOUNTER — Encounter (HOSPITAL_COMMUNITY): Payer: BC Managed Care – PPO

## 2014-04-15 ENCOUNTER — Encounter (HOSPITAL_COMMUNITY): Payer: BC Managed Care – PPO

## 2014-04-17 ENCOUNTER — Encounter (HOSPITAL_COMMUNITY): Payer: BC Managed Care – PPO

## 2014-04-18 ENCOUNTER — Encounter (HOSPITAL_COMMUNITY)
Admission: RE | Admit: 2014-04-18 | Discharge: 2014-04-18 | Disposition: A | Discharge status: Home / Self Care | Payer: BC Managed Care – PPO | Source: Ambulatory Visit | Attending: Cardiology | Admitting: Cardiology

## 2014-04-18 DIAGNOSIS — R7989 Other specified abnormal findings of blood chemistry: Secondary | ICD-10-CM | POA: Insufficient documentation

## 2014-04-18 DIAGNOSIS — I2510 Atherosclerotic heart disease of native coronary artery without angina pectoris: Secondary | ICD-10-CM | POA: Insufficient documentation

## 2014-04-18 DIAGNOSIS — I10 Essential (primary) hypertension: Secondary | ICD-10-CM | POA: Insufficient documentation

## 2014-04-18 DIAGNOSIS — Z5189 Encounter for other specified aftercare: Secondary | ICD-10-CM | POA: Insufficient documentation

## 2014-04-18 NOTE — Progress Notes (Signed)
Cardiac Rehab Medication Review by a Pharmacist  Does the patient  feel that his/her medications are working for him/her?  yes  Has the patient been experiencing any side effects to the medications prescribed?  no  Does the patient measure his/her own blood pressure or blood glucose at home?  yes   Does the patient have any problems obtaining medications due to transportation or finances?   no  Understanding of regimen: good Understanding of indications: good Potential of compliance: excellent    Pharmacist comments: Pt expresses understanding of medications and indications for each. Pt reports good compliance with very seldomly missing any doses. She states that she requires much less use of analgesics for pain - now only for exertional back pain.  Drucie Opitz, PharmD Clinical Pharmacy Resident Pager: [phone] 04/18/2014 9:20 AM

## 2014-04-19 ENCOUNTER — Encounter (HOSPITAL_COMMUNITY): Payer: BC Managed Care – PPO

## 2014-04-22 ENCOUNTER — Encounter (HOSPITAL_COMMUNITY): Payer: BC Managed Care – PPO

## 2014-04-24 ENCOUNTER — Encounter (HOSPITAL_COMMUNITY)
Admission: RE | Admit: 2014-04-24 | Discharge: 2014-04-24 | Disposition: A | Discharge status: Home / Self Care | Payer: BC Managed Care – PPO | Source: Ambulatory Visit | Attending: Cardiology | Admitting: Cardiology

## 2014-04-24 ENCOUNTER — Encounter (HOSPITAL_COMMUNITY): Payer: BC Managed Care – PPO

## 2014-04-24 DIAGNOSIS — Z5189 Encounter for other specified aftercare: Secondary | ICD-10-CM | POA: Diagnosis present

## 2014-04-24 DIAGNOSIS — I10 Essential (primary) hypertension: Secondary | ICD-10-CM | POA: Diagnosis not present

## 2014-04-24 DIAGNOSIS — I2510 Atherosclerotic heart disease of native coronary artery without angina pectoris: Secondary | ICD-10-CM | POA: Diagnosis present

## 2014-04-24 DIAGNOSIS — R7989 Other specified abnormal findings of blood chemistry: Secondary | ICD-10-CM | POA: Diagnosis not present

## 2014-04-24 LAB — GLUCOSE, CAPILLARY
GLUCOSE-CAPILLARY: 151 mg/dL — AB (ref 70–99)
Glucose-Capillary: 163 mg/dL — ABNORMAL HIGH (ref 70–99)

## 2014-04-24 NOTE — Progress Notes (Signed)
Pt started cardiac rehab today.  Pt tolerated light exercise without difficulty. Telemetry rhythm Sinus. Blood pressure 615'P- 794'F systolic  Diastolic blood pressure 27'M to 70's. Isabel Sharp takes hemodialysis at home. The patient hopes she will be able to get on the Kidney transplant list at the end of the month. Will continue to monitor the patient throughout  the program.

## 2014-04-26 ENCOUNTER — Encounter (HOSPITAL_COMMUNITY): Payer: BC Managed Care – PPO

## 2014-04-26 ENCOUNTER — Encounter (HOSPITAL_COMMUNITY)
Admission: RE | Admit: 2014-04-26 | Discharge: 2014-04-26 | Disposition: A | Discharge status: Home / Self Care | Payer: BC Managed Care – PPO | Source: Ambulatory Visit | Attending: Cardiology | Admitting: Cardiology

## 2014-04-26 DIAGNOSIS — Z5189 Encounter for other specified aftercare: Secondary | ICD-10-CM | POA: Diagnosis not present

## 2014-04-29 ENCOUNTER — Encounter (HOSPITAL_COMMUNITY)
Admission: RE | Admit: 2014-04-29 | Discharge: 2014-04-29 | Disposition: A | Discharge status: Home / Self Care | Payer: BC Managed Care – PPO | Source: Ambulatory Visit | Attending: Cardiology | Admitting: Cardiology

## 2014-04-29 ENCOUNTER — Encounter (HOSPITAL_COMMUNITY): Payer: BC Managed Care – PPO

## 2014-04-29 DIAGNOSIS — Z5189 Encounter for other specified aftercare: Secondary | ICD-10-CM | POA: Diagnosis not present

## 2014-04-29 LAB — GLUCOSE, CAPILLARY
GLUCOSE-CAPILLARY: 169 mg/dL — AB (ref 70–99)
Glucose-Capillary: 151 mg/dL — ABNORMAL HIGH (ref 70–99)
Glucose-Capillary: 92 mg/dL (ref 70–99)
Glucose-Capillary: 128 mg/dL — ABNORMAL HIGH (ref 70–99)

## 2014-04-29 NOTE — Progress Notes (Signed)
Academic Intern reviewed home exercise with pt today.  Pt plans to continue walking at home for exercise.  Reviewed THR, pulse, RPE, sign and symptoms, and when to call 911 or MD.  Pt voiced understanding. Alberteen Sam, MA, ACSM RCEP

## 2014-05-01 ENCOUNTER — Encounter (HOSPITAL_COMMUNITY)
Admission: RE | Admit: 2014-05-01 | Discharge: 2014-05-01 | Disposition: A | Discharge status: Home / Self Care | Payer: BC Managed Care – PPO | Source: Ambulatory Visit | Attending: Cardiology | Admitting: Cardiology

## 2014-05-01 ENCOUNTER — Encounter (HOSPITAL_COMMUNITY): Payer: BC Managed Care – PPO

## 2014-05-01 DIAGNOSIS — Z5189 Encounter for other specified aftercare: Secondary | ICD-10-CM | POA: Diagnosis not present

## 2014-05-01 LAB — GLUCOSE, CAPILLARY
Glucose-Capillary: 197 mg/dL — ABNORMAL HIGH (ref 70–99)
Glucose-Capillary: 159 mg/dL — ABNORMAL HIGH (ref 70–99)

## 2014-05-03 ENCOUNTER — Encounter (HOSPITAL_COMMUNITY): Payer: BC Managed Care – PPO

## 2014-05-03 ENCOUNTER — Encounter (HOSPITAL_COMMUNITY)
Admission: RE | Admit: 2014-05-03 | Discharge: 2014-05-03 | Disposition: A | Discharge status: Home / Self Care | Payer: BC Managed Care – PPO | Source: Ambulatory Visit | Attending: Cardiology | Admitting: Cardiology

## 2014-05-03 DIAGNOSIS — Z5189 Encounter for other specified aftercare: Secondary | ICD-10-CM | POA: Diagnosis not present

## 2014-05-06 ENCOUNTER — Encounter (HOSPITAL_COMMUNITY): Payer: BC Managed Care – PPO

## 2014-05-06 ENCOUNTER — Encounter (HOSPITAL_COMMUNITY)
Admission: RE | Admit: 2014-05-06 | Discharge: 2014-05-06 | Disposition: A | Discharge status: Home / Self Care | Payer: BC Managed Care – PPO | Source: Ambulatory Visit | Attending: Cardiology | Admitting: Cardiology

## 2014-05-06 DIAGNOSIS — R7989 Other specified abnormal findings of blood chemistry: Secondary | ICD-10-CM | POA: Insufficient documentation

## 2014-05-06 DIAGNOSIS — I2510 Atherosclerotic heart disease of native coronary artery without angina pectoris: Secondary | ICD-10-CM | POA: Insufficient documentation

## 2014-05-06 DIAGNOSIS — Z5189 Encounter for other specified aftercare: Secondary | ICD-10-CM | POA: Insufficient documentation

## 2014-05-06 DIAGNOSIS — I10 Essential (primary) hypertension: Secondary | ICD-10-CM | POA: Insufficient documentation

## 2014-05-06 NOTE — Progress Notes (Signed)
Pt arrived at cardiac rehab today with BP-180/90.  Recheck:  190/88.  Pt reports her BP has been elevated all day at hemodialysis  this morning.  Pt reports she accidentally missed her medications yesterday, however she states she did take meds today as prescribed.  Pt c/o dull headache and mild lightheadedness associated with hypertension which she reports is usual for her when BP elevated. Pt did not exercise.  PC to  Dr. Duke Salvia to advise.  Phone message left for Dr. Duke Salvia to return call to cardiac rehab dept if before 5pm or pt home if after 5pm.  Pt advised will await MD recommendation and instructed to rest the remainder of the day.  Understanding verbalized

## 2014-05-07 ENCOUNTER — Telehealth (HOSPITAL_COMMUNITY): Payer: Self-pay | Admitting: Cardiac Rehabilitation

## 2014-05-07 NOTE — Telephone Encounter (Signed)
pc to pt to assess response from Dr Duke Salvia.  Pt states she did not receive call from Dr. Duke Salvia last night however her BP is improving. BP last night was 167/73 and 164/90. Pt reports she has not missed additional doses of medicine. Pt instructed to continue current regimen,  Strict adherence to low sodium diet and avoid heavy exertion.  Pt also instructed to notify cardiac rehab dept and Dr. Duke Salvia if BP elevated at dialysis tomorrow am. Pt verbalized understanding.

## 2014-05-08 ENCOUNTER — Encounter (HOSPITAL_COMMUNITY): Payer: BC Managed Care – PPO

## 2014-05-08 ENCOUNTER — Encounter (HOSPITAL_COMMUNITY): Admission: RE | Admit: 2014-05-08 | Payer: BC Managed Care – PPO | Source: Ambulatory Visit

## 2014-05-10 ENCOUNTER — Encounter (HOSPITAL_COMMUNITY): Admission: RE | Admit: 2014-05-10 | Payer: BC Managed Care – PPO | Source: Ambulatory Visit

## 2014-05-10 ENCOUNTER — Telehealth (HOSPITAL_COMMUNITY): Payer: Self-pay | Admitting: Cardiac Rehabilitation

## 2014-05-10 ENCOUNTER — Encounter (HOSPITAL_COMMUNITY): Payer: BC Managed Care – PPO

## 2014-05-10 NOTE — Telephone Encounter (Addendum)
(  late entry for 05/08/14)  pc received from pt reporting her BP-184/80 at hemodialysis this morning.  Pt reports Dr. Duke Salvia increased her losartan to $RemoveBef'50mg'[id number]$  daily.  Pt will continue to monitor bp.  Pt advised unable to exercise at cardiac rehab if BP >160/90.  Understanding verbalized

## 2014-05-13 ENCOUNTER — Encounter (HOSPITAL_COMMUNITY): Payer: BC Managed Care – PPO

## 2014-05-13 ENCOUNTER — Encounter (HOSPITAL_COMMUNITY): Admission: RE | Admit: 2014-05-13 | Payer: BC Managed Care – PPO | Source: Ambulatory Visit

## 2014-05-15 ENCOUNTER — Encounter (HOSPITAL_COMMUNITY)
Admission: RE | Admit: 2014-05-15 | Discharge: 2014-05-15 | Disposition: A | Discharge status: Home / Self Care | Payer: BC Managed Care – PPO | Source: Ambulatory Visit | Attending: Cardiology | Admitting: Cardiology

## 2014-05-15 ENCOUNTER — Encounter (HOSPITAL_COMMUNITY): Payer: BC Managed Care – PPO

## 2014-05-15 DIAGNOSIS — R7989 Other specified abnormal findings of blood chemistry: Secondary | ICD-10-CM | POA: Diagnosis not present

## 2014-05-15 DIAGNOSIS — Z5189 Encounter for other specified aftercare: Secondary | ICD-10-CM | POA: Diagnosis present

## 2014-05-15 DIAGNOSIS — I10 Essential (primary) hypertension: Secondary | ICD-10-CM | POA: Diagnosis not present

## 2014-05-15 DIAGNOSIS — I2510 Atherosclerotic heart disease of native coronary artery without angina pectoris: Secondary | ICD-10-CM | POA: Diagnosis present

## 2014-05-17 ENCOUNTER — Encounter (HOSPITAL_COMMUNITY): Payer: BC Managed Care – PPO

## 2014-05-17 ENCOUNTER — Telehealth (HOSPITAL_COMMUNITY): Payer: Self-pay | Admitting: Internal Medicine

## 2014-05-20 ENCOUNTER — Encounter (HOSPITAL_COMMUNITY): Payer: BC Managed Care – PPO

## 2014-05-22 ENCOUNTER — Encounter (HOSPITAL_COMMUNITY): Payer: BC Managed Care – PPO

## 2014-05-24 ENCOUNTER — Encounter (HOSPITAL_COMMUNITY): Payer: BC Managed Care – PPO

## 2014-05-27 ENCOUNTER — Encounter (HOSPITAL_COMMUNITY): Payer: BC Managed Care – PPO

## 2014-05-29 ENCOUNTER — Encounter (HOSPITAL_COMMUNITY): Payer: BC Managed Care – PPO

## 2014-05-31 ENCOUNTER — Encounter (HOSPITAL_COMMUNITY): Payer: BC Managed Care – PPO

## 2014-06-03 ENCOUNTER — Encounter (HOSPITAL_COMMUNITY): Payer: BC Managed Care – PPO

## 2014-06-05 ENCOUNTER — Encounter (HOSPITAL_COMMUNITY): Payer: BC Managed Care – PPO

## 2014-06-05 ENCOUNTER — Encounter (HOSPITAL_COMMUNITY): Admission: RE | Admit: 2014-06-05 | Payer: BC Managed Care – PPO | Source: Ambulatory Visit

## 2014-06-07 ENCOUNTER — Encounter (HOSPITAL_COMMUNITY): Payer: BC Managed Care – PPO

## 2014-06-10 ENCOUNTER — Encounter (HOSPITAL_COMMUNITY): Payer: BC Managed Care – PPO

## 2014-06-12 ENCOUNTER — Encounter (HOSPITAL_COMMUNITY): Payer: BC Managed Care – PPO

## 2014-06-14 ENCOUNTER — Encounter (HOSPITAL_COMMUNITY): Payer: BC Managed Care – PPO

## 2014-06-14 ENCOUNTER — Telehealth (HOSPITAL_COMMUNITY): Payer: Self-pay | Admitting: *Deleted

## 2014-06-17 ENCOUNTER — Encounter (HOSPITAL_COMMUNITY): Payer: BC Managed Care – PPO

## 2014-06-19 ENCOUNTER — Encounter (HOSPITAL_COMMUNITY): Payer: BC Managed Care – PPO

## 2014-06-21 ENCOUNTER — Encounter (HOSPITAL_COMMUNITY): Payer: BC Managed Care – PPO

## 2014-06-24 ENCOUNTER — Encounter (HOSPITAL_COMMUNITY): Payer: BC Managed Care – PPO

## 2014-06-26 ENCOUNTER — Encounter (HOSPITAL_COMMUNITY): Payer: BC Managed Care – PPO

## 2014-06-27 ENCOUNTER — Telehealth (HOSPITAL_COMMUNITY): Payer: Self-pay | Admitting: *Deleted

## 2014-06-28 ENCOUNTER — Encounter (HOSPITAL_COMMUNITY): Payer: BC Managed Care – PPO

## 2014-07-01 ENCOUNTER — Encounter (HOSPITAL_COMMUNITY): Payer: BC Managed Care – PPO

## 2014-07-03 ENCOUNTER — Encounter (HOSPITAL_COMMUNITY): Payer: BC Managed Care – PPO

## 2014-07-05 ENCOUNTER — Encounter (HOSPITAL_COMMUNITY): Payer: BC Managed Care – PPO

## 2014-07-08 ENCOUNTER — Encounter (HOSPITAL_COMMUNITY): Payer: BC Managed Care – PPO

## 2014-07-10 ENCOUNTER — Encounter (HOSPITAL_COMMUNITY): Payer: BC Managed Care – PPO

## 2014-07-12 ENCOUNTER — Encounter (HOSPITAL_COMMUNITY): Payer: BC Managed Care – PPO

## 2014-07-15 ENCOUNTER — Ambulatory Visit (HOSPITAL_COMMUNITY): Payer: BC Managed Care – PPO

## 2014-07-15 ENCOUNTER — Encounter (HOSPITAL_COMMUNITY): Payer: BC Managed Care – PPO

## 2014-07-17 ENCOUNTER — Ambulatory Visit (HOSPITAL_COMMUNITY): Payer: BC Managed Care – PPO

## 2014-07-17 ENCOUNTER — Encounter (HOSPITAL_COMMUNITY): Payer: BC Managed Care – PPO

## 2014-07-19 ENCOUNTER — Ambulatory Visit (HOSPITAL_COMMUNITY): Payer: BC Managed Care – PPO

## 2014-07-19 ENCOUNTER — Encounter (HOSPITAL_COMMUNITY): Payer: BC Managed Care – PPO

## 2014-07-22 ENCOUNTER — Encounter (HOSPITAL_COMMUNITY): Payer: BC Managed Care – PPO

## 2014-07-22 ENCOUNTER — Ambulatory Visit (HOSPITAL_COMMUNITY): Payer: BC Managed Care – PPO

## 2014-07-24 ENCOUNTER — Ambulatory Visit (HOSPITAL_COMMUNITY): Payer: BC Managed Care – PPO

## 2014-07-24 ENCOUNTER — Encounter (HOSPITAL_COMMUNITY): Payer: BC Managed Care – PPO

## 2014-07-26 ENCOUNTER — Encounter (HOSPITAL_COMMUNITY): Payer: BC Managed Care – PPO

## 2014-07-26 ENCOUNTER — Ambulatory Visit (HOSPITAL_COMMUNITY): Payer: BC Managed Care – PPO

## 2014-08-13 ENCOUNTER — Telehealth (HOSPITAL_COMMUNITY): Payer: Self-pay | Admitting: *Deleted

## 2015-04-30 HISTORY — PX: CAROTID ENDARTERECTOMY: SUR193

## 2016-01-30 ENCOUNTER — Emergency Department (HOSPITAL_COMMUNITY): Payer: BLUE CROSS/BLUE SHIELD | Admitting: Anesthesiology

## 2016-01-30 ENCOUNTER — Inpatient Hospital Stay (HOSPITAL_BASED_OUTPATIENT_CLINIC_OR_DEPARTMENT_OTHER)
Admission: EM | Admit: 2016-01-30 | Discharge: 2016-02-06 | DRG: 981 | Disposition: A | Discharge status: Home / Self Care | Payer: BLUE CROSS/BLUE SHIELD | Attending: Internal Medicine | Admitting: Internal Medicine

## 2016-01-30 ENCOUNTER — Encounter (HOSPITAL_COMMUNITY): Admission: EM | Disposition: A | Discharge status: Home / Self Care | Payer: Self-pay | Source: Home / Self Care

## 2016-01-30 ENCOUNTER — Encounter (HOSPITAL_BASED_OUTPATIENT_CLINIC_OR_DEPARTMENT_OTHER): Payer: Self-pay | Admitting: *Deleted

## 2016-01-30 ENCOUNTER — Emergency Department (HOSPITAL_COMMUNITY): Payer: BLUE CROSS/BLUE SHIELD

## 2016-01-30 DIAGNOSIS — R6510 Systemic inflammatory response syndrome (SIRS) of non-infectious origin without acute organ dysfunction: Secondary | ICD-10-CM | POA: Diagnosis not present

## 2016-01-30 DIAGNOSIS — E11649 Type 2 diabetes mellitus with hypoglycemia without coma: Principal | ICD-10-CM | POA: Diagnosis present

## 2016-01-30 DIAGNOSIS — I10 Essential (primary) hypertension: Secondary | ICD-10-CM | POA: Diagnosis present

## 2016-01-30 DIAGNOSIS — I248 Other forms of acute ischemic heart disease: Secondary | ICD-10-CM | POA: Diagnosis not present

## 2016-01-30 DIAGNOSIS — N2581 Secondary hyperparathyroidism of renal origin: Secondary | ICD-10-CM | POA: Diagnosis present

## 2016-01-30 DIAGNOSIS — E875 Hyperkalemia: Secondary | ICD-10-CM | POA: Diagnosis present

## 2016-01-30 DIAGNOSIS — I2510 Atherosclerotic heart disease of native coronary artery without angina pectoris: Secondary | ICD-10-CM | POA: Diagnosis present

## 2016-01-30 DIAGNOSIS — R1032 Left lower quadrant pain: Secondary | ICD-10-CM

## 2016-01-30 DIAGNOSIS — K353 Acute appendicitis with localized peritonitis: Secondary | ICD-10-CM | POA: Diagnosis present

## 2016-01-30 DIAGNOSIS — I429 Cardiomyopathy, unspecified: Secondary | ICD-10-CM | POA: Diagnosis present

## 2016-01-30 DIAGNOSIS — Z794 Long term (current) use of insulin: Secondary | ICD-10-CM

## 2016-01-30 DIAGNOSIS — R030 Elevated blood-pressure reading, without diagnosis of hypertension: Secondary | ICD-10-CM

## 2016-01-30 DIAGNOSIS — IMO0001 Reserved for inherently not codable concepts without codable children: Secondary | ICD-10-CM

## 2016-01-30 DIAGNOSIS — D631 Anemia in chronic kidney disease: Secondary | ICD-10-CM | POA: Diagnosis present

## 2016-01-30 DIAGNOSIS — Z7982 Long term (current) use of aspirin: Secondary | ICD-10-CM

## 2016-01-30 DIAGNOSIS — I252 Old myocardial infarction: Secondary | ICD-10-CM

## 2016-01-30 DIAGNOSIS — R778 Other specified abnormalities of plasma proteins: Secondary | ICD-10-CM | POA: Diagnosis present

## 2016-01-30 DIAGNOSIS — Z951 Presence of aortocoronary bypass graft: Secondary | ICD-10-CM

## 2016-01-30 DIAGNOSIS — E1165 Type 2 diabetes mellitus with hyperglycemia: Secondary | ICD-10-CM | POA: Diagnosis present

## 2016-01-30 DIAGNOSIS — R7881 Bacteremia: Secondary | ICD-10-CM | POA: Diagnosis not present

## 2016-01-30 DIAGNOSIS — K3580 Unspecified acute appendicitis: Secondary | ICD-10-CM | POA: Diagnosis present

## 2016-01-30 DIAGNOSIS — I110 Hypertensive heart disease with heart failure: Secondary | ICD-10-CM | POA: Diagnosis present

## 2016-01-30 DIAGNOSIS — R4182 Altered mental status, unspecified: Secondary | ICD-10-CM

## 2016-01-30 DIAGNOSIS — K660 Peritoneal adhesions (postprocedural) (postinfection): Secondary | ICD-10-CM | POA: Diagnosis present

## 2016-01-30 DIAGNOSIS — E1122 Type 2 diabetes mellitus with diabetic chronic kidney disease: Secondary | ICD-10-CM | POA: Diagnosis present

## 2016-01-30 DIAGNOSIS — IMO0002 Reserved for concepts with insufficient information to code with codable children: Secondary | ICD-10-CM | POA: Diagnosis present

## 2016-01-30 DIAGNOSIS — E785 Hyperlipidemia, unspecified: Secondary | ICD-10-CM | POA: Diagnosis present

## 2016-01-30 DIAGNOSIS — Z6830 Body mass index (BMI) 30.0-30.9, adult: Secondary | ICD-10-CM

## 2016-01-30 DIAGNOSIS — Z992 Dependence on renal dialysis: Secondary | ICD-10-CM

## 2016-01-30 DIAGNOSIS — G9341 Metabolic encephalopathy: Secondary | ICD-10-CM | POA: Diagnosis present

## 2016-01-30 DIAGNOSIS — R509 Fever, unspecified: Secondary | ICD-10-CM

## 2016-01-30 DIAGNOSIS — G9340 Encephalopathy, unspecified: Secondary | ICD-10-CM | POA: Diagnosis not present

## 2016-01-30 DIAGNOSIS — R7989 Other specified abnormal findings of blood chemistry: Secondary | ICD-10-CM | POA: Diagnosis present

## 2016-01-30 DIAGNOSIS — E1129 Type 2 diabetes mellitus with other diabetic kidney complication: Secondary | ICD-10-CM | POA: Diagnosis present

## 2016-01-30 DIAGNOSIS — N186 End stage renal disease: Secondary | ICD-10-CM | POA: Diagnosis present

## 2016-01-30 DIAGNOSIS — E118 Type 2 diabetes mellitus with unspecified complications: Secondary | ICD-10-CM

## 2016-01-30 DIAGNOSIS — I5022 Chronic systolic (congestive) heart failure: Secondary | ICD-10-CM | POA: Diagnosis present

## 2016-01-30 DIAGNOSIS — Z888 Allergy status to other drugs, medicaments and biological substances status: Secondary | ICD-10-CM

## 2016-01-30 HISTORY — DX: Type 2 diabetes mellitus with diabetic chronic kidney disease: E11.22

## 2016-01-30 HISTORY — PX: LAPAROSCOPIC APPENDECTOMY: SHX408

## 2016-01-30 HISTORY — DX: End stage renal disease: N18.6

## 2016-01-30 HISTORY — DX: Dependence on renal dialysis: Z99.2

## 2016-01-30 HISTORY — DX: Peripheral vascular disease, unspecified: I73.9

## 2016-01-30 HISTORY — DX: Disorder of arteries and arterioles, unspecified: I77.9

## 2016-01-30 LAB — COMPREHENSIVE METABOLIC PANEL
ALK PHOS: 92 U/L (ref 38–126)
ALT: 17 U/L (ref 14–54)
ANION GAP: 5 (ref 5–15)
AST: 17 U/L (ref 15–41)
Albumin: 3.6 g/dL (ref 3.5–5.0)
BILIRUBIN TOTAL: 0.4 mg/dL (ref 0.3–1.2)
BUN: 10 mg/dL (ref 6–20)
CALCIUM: 8.3 mg/dL — AB (ref 8.9–10.3)
CO2: 31 mmol/L (ref 22–32)
Chloride: 102 mmol/L (ref 101–111)
Creatinine, Ser: 3.39 mg/dL — ABNORMAL HIGH (ref 0.44–1.00)
GFR calc Af Amer: 17 mL/min — ABNORMAL LOW (ref >60)
GFR calc non Af Amer: 15 mL/min — ABNORMAL LOW (ref >60)
Glucose, Bld: 164 mg/dL — ABNORMAL HIGH (ref 65–99)
Potassium: 3.3 mmol/L — ABNORMAL LOW (ref 3.5–5.1)
SODIUM: 138 mmol/L (ref 135–145)
Total Protein: 7.3 g/dL (ref 6.5–8.1)

## 2016-01-30 LAB — CBC WITH DIFFERENTIAL/PLATELET
Basophils Absolute: 0 10*3/uL (ref 0.0–0.1)
Basophils Relative: 0 %
EOS ABS: 0.7 10*3/uL (ref 0.0–0.7)
Eosinophils Relative: 5 %
HCT: 28.7 % — ABNORMAL LOW (ref 36.0–46.0)
HEMOGLOBIN: 9.4 g/dL — AB (ref 12.0–15.0)
LYMPHS ABS: 1.2 10*3/uL (ref 0.7–4.0)
LYMPHS PCT: 9 %
MCH: 32.3 pg (ref 26.0–34.0)
MCHC: 32.8 g/dL (ref 30.0–36.0)
MCV: 98.6 fL (ref 78.0–100.0)
MONOS PCT: 5 %
Monocytes Absolute: 0.7 10*3/uL (ref 0.1–1.0)
NEUTROS PCT: 81 %
Neutro Abs: 10.8 10*3/uL — ABNORMAL HIGH (ref 1.7–7.7)
Platelets: 259 10*3/uL (ref 150–400)
RBC: 2.91 MIL/uL — ABNORMAL LOW (ref 3.87–5.11)
RDW: 14.1 % (ref 11.5–15.5)
WBC: 13.4 10*3/uL — ABNORMAL HIGH (ref 4.0–10.5)

## 2016-01-30 LAB — LIPASE, BLOOD: Lipase: 59 U/L — ABNORMAL HIGH (ref 11–51)

## 2016-01-30 SURGERY — APPENDECTOMY, LAPAROSCOPIC
Anesthesia: General | Site: Abdomen

## 2016-01-30 MED ORDER — SODIUM CHLORIDE 0.9 % IR SOLN
Status: DC | PRN
  Administered 2016-01-30: 1000 mL

## 2016-01-30 MED ORDER — SODIUM CHLORIDE 0.9 % IJ SOLN
INTRAMUSCULAR | Status: AC
  Filled 2016-01-30: qty 10

## 2016-01-30 MED ORDER — 0.9 % SODIUM CHLORIDE (POUR BTL) OPTIME
TOPICAL | Status: DC | PRN
  Administered 2016-01-30: 1000 mL

## 2016-01-30 MED ORDER — ONDANSETRON HCL 4 MG/2ML IJ SOLN
INTRAMUSCULAR | Status: AC
  Filled 2016-01-30: qty 2

## 2016-01-30 MED ORDER — BUPIVACAINE-EPINEPHRINE 0.25% -1:200000 IJ SOLN
INTRAMUSCULAR | Status: DC | PRN
  Administered 2016-01-30: 30 mL

## 2016-01-30 MED ORDER — MORPHINE SULFATE (PF) 4 MG/ML IV SOLN
4.0000 mg | Freq: Once | INTRAVENOUS | Status: AC
  Administered 2016-01-30: 4 mg via INTRAVENOUS
  Filled 2016-01-30: qty 1

## 2016-01-30 MED ORDER — PROPOFOL 10 MG/ML IV BOLUS
INTRAVENOUS | Status: AC
  Filled 2016-01-30: qty 20

## 2016-01-30 MED ORDER — SODIUM CHLORIDE 0.9 % IV SOLN
INTRAVENOUS | Status: DC | PRN
  Administered 2016-01-30: 23:00:00 via INTRAVENOUS

## 2016-01-30 MED ORDER — CIPROFLOXACIN IN D5W 400 MG/200ML IV SOLN
400.0000 mg | Freq: Once | INTRAVENOUS | Status: AC
  Filled 2016-01-30: qty 200

## 2016-01-30 MED ORDER — DEXTROSE 5 % IV SOLN: 1.0000 g | Freq: Once | INTRAVENOUS | Status: DC

## 2016-01-30 MED ORDER — METRONIDAZOLE IN NACL 5-0.79 MG/ML-% IV SOLN: 500.0000 mg | Freq: Once | INTRAVENOUS | Status: DC

## 2016-01-30 MED ORDER — NEOSTIGMINE METHYLSULFATE 5 MG/5ML IV SOSY
PREFILLED_SYRINGE | INTRAVENOUS | Status: AC
  Filled 2016-01-30: qty 5

## 2016-01-30 MED ORDER — MIDAZOLAM HCL 2 MG/2ML IJ SOLN
INTRAMUSCULAR | Status: DC | PRN
  Administered 2016-01-30: 1 mg via INTRAVENOUS

## 2016-01-30 MED ORDER — SUCCINYLCHOLINE CHLORIDE 20 MG/ML IJ SOLN
INTRAMUSCULAR | Status: DC | PRN
  Administered 2016-01-30: 80 mg via INTRAVENOUS

## 2016-01-30 MED ORDER — ONDANSETRON HCL 4 MG/2ML IJ SOLN
4.0000 mg | Freq: Once | INTRAMUSCULAR | Status: AC
  Administered 2016-01-30: 4 mg via INTRAVENOUS
  Filled 2016-01-30: qty 2

## 2016-01-30 MED ORDER — MIDAZOLAM HCL 2 MG/2ML IJ SOLN
INTRAMUSCULAR | Status: AC
  Filled 2016-01-30: qty 2

## 2016-01-30 MED ORDER — FENTANYL CITRATE (PF) 250 MCG/5ML IJ SOLN
INTRAMUSCULAR | Status: AC
Start: 2016-01-30 — End: 2016-01-30
  Filled 2016-01-30: qty 5

## 2016-01-30 MED ORDER — METRONIDAZOLE IN NACL 5-0.79 MG/ML-% IV SOLN
500.0000 mg | Freq: Once | INTRAVENOUS | Status: AC
  Administered 2016-01-30: 500 mg via INTRAVENOUS
  Filled 2016-01-30: qty 100

## 2016-01-30 MED ORDER — BUPIVACAINE-EPINEPHRINE (PF) 0.25% -1:200000 IJ SOLN
INTRAMUSCULAR | Status: AC
  Filled 2016-01-30: qty 30

## 2016-01-30 MED ORDER — HYDROMORPHONE HCL 1 MG/ML IJ SOLN
0.5000 mg | Freq: Once | INTRAMUSCULAR | Status: AC
  Administered 2016-01-30: 0.5 mg via INTRAVENOUS
  Filled 2016-01-30: qty 1

## 2016-01-30 MED ORDER — ROCURONIUM BROMIDE 100 MG/10ML IV SOLN
INTRAVENOUS | Status: DC | PRN
  Administered 2016-01-30: 30 mg via INTRAVENOUS
  Administered 2016-01-30: 20 mg via INTRAVENOUS

## 2016-01-30 MED ORDER — PROPOFOL 10 MG/ML IV BOLUS
INTRAVENOUS | Status: DC | PRN
  Administered 2016-01-30: 10 mg via INTRAVENOUS
  Administered 2016-01-30: 100 mg via INTRAVENOUS

## 2016-01-30 MED ORDER — GLYCOPYRROLATE 0.2 MG/ML IV SOSY
PREFILLED_SYRINGE | INTRAVENOUS | Status: AC
  Filled 2016-01-30: qty 3

## 2016-01-30 MED ORDER — FENTANYL CITRATE (PF) 250 MCG/5ML IJ SOLN
INTRAMUSCULAR | Status: DC | PRN
  Administered 2016-01-30: 100 ug via INTRAVENOUS
  Administered 2016-01-30: 50 ug via INTRAVENOUS

## 2016-01-30 SURGICAL SUPPLY — 40 items
APPLIER CLIP 5 13 M/L LIGAMAX5 (MISCELLANEOUS)
CANISTER SUCTION 2500CC (MISCELLANEOUS) ×2 IMPLANT
CHLORAPREP W/TINT 26ML (MISCELLANEOUS) ×2 IMPLANT
CLIP APPLIE 5 13 M/L LIGAMAX5 (MISCELLANEOUS) IMPLANT
COVER SURGICAL LIGHT HANDLE (MISCELLANEOUS) ×2 IMPLANT
CUTTER FLEX LINEAR 45M (STAPLE) ×2 IMPLANT
DERMABOND ADVANCED (GAUZE/BANDAGES/DRESSINGS) ×1
DERMABOND ADVANCED .7 DNX12 (GAUZE/BANDAGES/DRESSINGS) ×1 IMPLANT
DEVICE TROCAR PUNCTURE CLOSURE (ENDOMECHANICALS) ×2 IMPLANT
ELECT REM PT RETURN 9FT ADLT (ELECTROSURGICAL) ×2
ELECTRODE REM PT RTRN 9FT ADLT (ELECTROSURGICAL) ×1 IMPLANT
ENDOLOOP SUT PDS II  0 18 (SUTURE) ×2
ENDOLOOP SUT PDS II 0 18 (SUTURE) ×2 IMPLANT
GLOVE BIOGEL M 7.0 STRL (GLOVE) ×2 IMPLANT
GLOVE BIOGEL PI IND STRL 7.0 (GLOVE) ×3 IMPLANT
GLOVE BIOGEL PI INDICATOR 7.0 (GLOVE) ×3
GLOVE SURG SS PI 7.0 STRL IVOR (GLOVE) ×2 IMPLANT
GOWN STRL REUS W/ TWL LRG LVL3 (GOWN DISPOSABLE) ×3 IMPLANT
GOWN STRL REUS W/TWL LRG LVL3 (GOWN DISPOSABLE) ×3
KIT BASIN OR (CUSTOM PROCEDURE TRAY) ×2 IMPLANT
KIT ROOM TURNOVER OR (KITS) ×2 IMPLANT
LIQUID BAND (GAUZE/BANDAGES/DRESSINGS) ×2 IMPLANT
NS IRRIG 1000ML POUR BTL (IV SOLUTION) ×2 IMPLANT
PAD ARMBOARD 7.5X6 YLW CONV (MISCELLANEOUS) ×4 IMPLANT
POUCH RETRIEVAL ECOSAC 10 (ENDOMECHANICALS) ×2 IMPLANT
POUCH RETRIEVAL ECOSAC 10MM (ENDOMECHANICALS) ×2
RELOAD STAPLE TA45 3.5 REG BLU (ENDOMECHANICALS) ×2 IMPLANT
SCISSORS LAP 5X35 DISP (ENDOMECHANICALS) ×2 IMPLANT
SET IRRIG TUBING LAPAROSCOPIC (IRRIGATION / IRRIGATOR) ×2 IMPLANT
SLEEVE ENDOPATH XCEL 5M (ENDOMECHANICALS) ×2 IMPLANT
SPECIMEN JAR SMALL (MISCELLANEOUS) ×2 IMPLANT
SUT MNCRL AB 4-0 PS2 18 (SUTURE) ×2 IMPLANT
TOWEL OR 17X24 6PK STRL BLUE (TOWEL DISPOSABLE) ×2 IMPLANT
TOWEL OR 17X26 10 PK STRL BLUE (TOWEL DISPOSABLE) ×2 IMPLANT
TRAY FOLEY CATH 16FR SILVER (SET/KITS/TRAYS/PACK) IMPLANT
TRAY LAPAROSCOPIC MC (CUSTOM PROCEDURE TRAY) ×2 IMPLANT
TROCAR XCEL BLUNT TIP 100MML (ENDOMECHANICALS) ×2 IMPLANT
TROCAR XCEL NON-BLD 11X100MML (ENDOMECHANICALS) ×2 IMPLANT
TROCAR XCEL NON-BLD 5MMX100MML (ENDOMECHANICALS) ×2 IMPLANT
TUBING INSUFFLATION (TUBING) ×2 IMPLANT

## 2016-01-30 NOTE — Anesthesia Procedure Notes (Signed)
Procedure Name: Intubation Date/Time: 01/30/2016 11:18 PM Performed by: Valetta Fuller Pre-anesthesia Checklist: Patient identified, Emergency Drugs available, Suction available and Patient being monitored Patient Re-evaluated:Patient Re-evaluated prior to inductionOxygen Delivery Method: Circle system utilized Preoxygenation: Pre-oxygenation with 100% oxygen Intubation Type: IV induction Ventilation: Mask ventilation without difficulty Laryngoscope Size: Miller and 2 Grade View: Grade II Tube type: Oral Tube size: 7.5 mm Number of attempts: 1 Airway Equipment and Method: Stylet Placement Confirmation: ETT inserted through vocal cords under direct vision,  positive ETCO2 and breath sounds checked- equal and bilateral Secured at: 23 cm Tube secured with: Tape Dental Injury: Teeth and Oropharynx as per pre-operative assessment

## 2016-01-30 NOTE — Anesthesia Preprocedure Evaluation (Signed)
Anesthesia Evaluation  Patient identified by MRN, date of birth, ID band Patient awake    Reviewed: Allergy & Precautions, NPO status , Patient's Chart, lab work & pertinent test results, reviewed documented beta blocker date and time   History of Anesthesia Complications Negative for: history of anesthetic complications  Airway Mallampati: III  TM Distance: >3 FB Neck ROM: Full    Dental  (+) Teeth Intact   Pulmonary neg pulmonary ROS,    breath sounds clear to auscultation       Cardiovascular hypertension, Pt. on medications and Pt. on home beta blockers + CAD and + CABG   Rhythm:Regular     Neuro/Psych negative neurological ROS  negative psych ROS   GI/Hepatic negative GI ROS, Neg liver ROS,   Endo/Other  diabetes, Type 2, Insulin DependentMorbid obesity  Renal/GU ESRF and DialysisRenal disease     Musculoskeletal   Abdominal   Peds  Hematology  (+) anemia ,   Anesthesia Other Findings   Reproductive/Obstetrics                             Anesthesia Physical Anesthesia Plan  ASA: III  Anesthesia Plan: General   Post-op Pain Management:    Induction: Intravenous  Airway Management Planned: Oral ETT  Additional Equipment: None  Intra-op Plan:   Post-operative Plan: Extubation in OR  Informed Consent: I have reviewed the patients History and Physical, chart, labs and discussed the procedure including the risks, benefits and alternatives for the proposed anesthesia with the patient or authorized representative who has indicated his/her understanding and acceptance.   Dental advisory given  Plan Discussed with: CRNA and Surgeon  Anesthesia Plan Comments:         Anesthesia Quick Evaluation

## 2016-01-30 NOTE — ED Notes (Signed)
Pt reports constipation.  Small BM this morning.  Reports abdominal pain in the lower abdomen that started today.  Reports nausea.  Denies vomiting.  Pt is a dialysis patient, had dialysis this morning.

## 2016-01-30 NOTE — ED Notes (Signed)
Cipro hanging upon arrival from [facility]

## 2016-01-30 NOTE — H&P (Signed)
Isabel Sharp is an 45 y.o. female.   Chief Complaint: abdominal pain HPI: 45 yo female with 1 day of abdominal pain. Pain began in her left side then progressed to the right side. She had some nausea associated. No vomiting. Last meal was at 12:30.  Past Medical History  Diagnosis Date  . Dialysis patient ([place])   . Diabetes mellitus without complication ([place])   . Coronary artery disease     Past Surgical History  Procedure Laterality Date  . Cesarean section    . Laparoscopic bilateral salpingo oopherectomy    . Cardiac catheterization  02/21/14    done at [facility]-    Family History  Problem Relation Age of Onset  . Heart attack Mother   . Heart attack Father   . Heart attack Maternal Grandmother   . Heart attack Maternal Grandfather   . Hypertension Father    Social History:  reports that she has never smoked. She does not have any smokeless tobacco history on file. She reports that she does not drink alcohol or use illicit drugs.  Allergies:  Allergies  Allergen Reactions  . Norvasc [Amlodipine] Swelling     (Not in a hospital admission)  Results for orders placed or performed during the hospital encounter of 01/30/16 (from the past 48 hour(s))  CBC with Differential/Platelet     Status: Abnormal   Collection Time: 01/30/16  4:58 PM  Result Value Ref Range   WBC 13.4 (H) 4.0 - 10.5 K/uL   RBC 2.91 (L) 3.87 - 5.11 MIL/uL   Hemoglobin 9.4 (L) 12.0 - 15.0 g/dL   HCT 28.7 (L) 36.0 - 46.0 %   MCV 98.6 78.0 - 100.0 fL   MCH 32.3 26.0 - 34.0 pg   MCHC 32.8 30.0 - 36.0 g/dL   RDW 14.1 11.5 - 15.5 %   Platelets 259 150 - 400 K/uL   Neutrophils Relative % 81 %   Neutro Abs 10.8 (H) 1.7 - 7.7 K/uL   Lymphocytes Relative 9 %   Lymphs Abs 1.2 0.7 - 4.0 K/uL   Monocytes Relative 5 %   Monocytes Absolute 0.7 0.1 - 1.0 K/uL   Eosinophils Relative 5 %   Eosinophils Absolute 0.7 0.0 - 0.7 K/uL   Basophils Relative 0 %   Basophils Absolute 0.0 0.0 - 0.1 K/uL   Comprehensive metabolic panel     Status: Abnormal   Collection Time: 01/30/16  4:58 PM  Result Value Ref Range   Sodium 138 135 - 145 mmol/L   Potassium 3.3 (L) 3.5 - 5.1 mmol/L   Chloride 102 101 - 111 mmol/L   CO2 31 22 - 32 mmol/L   Glucose, Bld 164 (H) 65 - 99 mg/dL   BUN 10 6 - 20 mg/dL   Creatinine, Ser 3.39 (H) 0.44 - 1.00 mg/dL   Calcium 8.3 (L) 8.9 - 10.3 mg/dL   Total Protein 7.3 6.5 - 8.1 g/dL   Albumin 3.6 3.5 - 5.0 g/dL   AST 17 15 - 41 U/L   ALT 17 14 - 54 U/L   Alkaline Phosphatase 92 38 - 126 U/L   Total Bilirubin 0.4 0.3 - 1.2 mg/dL   GFR calc non Af Amer 15 (L) >60 mL/min   GFR calc Af Amer 17 (L) >60 mL/min    Comment: (NOTE) The eGFR has been calculated using the CKD EPI equation. This calculation has not been validated in all clinical situations. eGFR's persistently <60 mL/min signify possible Chronic Kidney Disease.  Anion gap 5 5 - 15  Lipase, blood     Status: Abnormal   Collection Time: 01/30/16  4:58 PM  Result Value Ref Range   Lipase 59 (H) 11 - 51 U/L   Ct Abdomen Pelvis Wo Contrast  01/30/2016  CLINICAL DATA:  Acute onset of left lower quadrant and right lower quadrant abdominal pain. Initial encounter. EXAM: CT ABDOMEN AND PELVIS WITHOUT CONTRAST TECHNIQUE: Multidetector CT imaging of the abdomen and pelvis was performed following the standard protocol without IV contrast. COMPARISON:  None. FINDINGS: The visualized lung bases are clear. The patient is status post median sternotomy. Scattered coronary artery calcifications are seen. The liver and spleen are unremarkable in appearance. The gallbladder is within normal limits. The pancreas and adrenal glands are unremarkable. Nonspecific perinephric stranding is noted bilaterally. Scattered bilateral calcifications are noted along the renal arteries and their branches. There is no evidence of hydronephrosis. No renal or ureteral stones are identified. A 2.1 cm cyst is noted at the posterior aspect of  the left kidney. The small bowel is unremarkable in appearance. The stomach is within normal limits. No acute vascular abnormalities are seen. Scattered calcification is seen along the abdominal aorta and its branches, including along the superior mesenteric artery and inferior mesenteric artery. The appendix is dilated 1.2 cm in maximal diameter, with suggestion of a 1.2 cm appendicolith near the base of the appendix, and mild surrounding soft tissue inflammation. This raises concern for mild acute appendicitis. There is no evidence of perforation or abscess formation at this time. Trace underlying fluid is seen. The colon is largely decompressed and is grossly unremarkable in appearance. The bladder is mildly distended and grossly unremarkable. The uterus is unremarkable in appearance. The ovaries are relatively symmetric. No suspicious adnexal masses are seen. No inguinal lymphadenopathy is seen. No acute osseous abnormalities are identified. IMPRESSION: 1. Mild acute appendicitis, with dilatation of the appendix to 1.2 cm in maximal diameter, and suggestion of 1.2 cm appendicolith near the base of the appendix. Mild surrounding soft tissue inflammation noted. No evidence of perforation or abscess formation at this time. 2. Scattered calcification along the abdominal aorta and its branches, including along the superior mesenteric artery and inferior mesenteric artery. Scattered calcifications along the renal arteries and their branches. 3. Scattered coronary artery calcifications seen. 4. Small left renal cyst noted. These results were called by telephone at the time of interpretation on 01/30/2016 at 8:40 pm to Dr. Rockne Menghini, who verbally acknowledged these results. Electronically Signed   By: Garald Balding M.D.   On: 01/30/2016 20:41    Review of Systems  Constitutional: Negative for fever and chills.  HENT: Negative for hearing loss.   Eyes: Negative for blurred vision and double vision.   Respiratory: Negative for cough and hemoptysis.   Cardiovascular: Negative for chest pain and palpitations.  Gastrointestinal: Positive for nausea and abdominal pain. Negative for vomiting.  Genitourinary: Negative for dysuria and urgency.  Musculoskeletal: Negative for myalgias and neck pain.  Skin: Negative for itching and rash.  Neurological: Negative for dizziness, tingling and headaches.  Endo/Heme/Allergies: Does not bruise/bleed easily.  Psychiatric/Behavioral: Negative for depression and suicidal ideas.    Blood pressure 194/72, pulse 70, temperature 98.8 F (37.1 C), temperature source Oral, resp. rate 18, height 5' 1"  (1.549 m), weight 73.029 kg (161 lb), SpO2 95 %. Physical Exam  Vitals reviewed. Constitutional: She is oriented to person, place, and time. She appears well-developed and well-nourished.  HENT:  Head: Normocephalic and  atraumatic.  Eyes: Conjunctivae and EOM are normal. Pupils are equal, round, and reactive to light.  Neck: Normal range of motion. Neck supple.  Cardiovascular: Normal rate and regular rhythm.   Respiratory: Effort normal and breath sounds normal.  GI: Soft. Bowel sounds are normal. She exhibits no distension. There is tenderness in the right lower quadrant. There is no guarding and negative Murphy's sign.  Musculoskeletal: Normal range of motion.  Neurological: She is alert and oriented to person, place, and time.  Skin: Skin is warm and dry.  Psychiatric: She has a normal mood and affect. Her behavior is normal.     Assessment/Plan 45 year old female with DM/HTN/ESRD presents with abdominal pain, leukocytosis, and imaging findings consistent withy appendicitis. -IV antibiotics -plan for lap appy  Mickeal Skinner, MD 01/30/2016, 10:06 PM

## 2016-01-30 NOTE — ED Provider Notes (Signed)
Assumed care of Isabel Sharp on arrival to the ED after she was transferred from OSH for CT scan to further evaluate her lower abdominal pain with concern for diverticulitis or appendicitis. On arrival her exam is consistent with prior with significant LLQ and to a lessor extent RLQ without guarding rebound, or rigidity. She was transferred receiving cipro and flagyl IV. VSS, AF. Labs show mild leukocytosis of 13.4. CT scan was done and the results were discussed with Radiology showing evidence of appendicitis without evidence of rupture. Surgery was consulted and saw the patient at the bedside and agreed to admit the patient to their service and take her to the OR for further care.   Isabel Jarred, DO 01/31/16 0825  Isabel Schmidt, MD 02/01/16 [phone]

## 2016-01-30 NOTE — ED Provider Notes (Signed)
CSN: [id number]     Arrival date & time 01/30/16  1513 History   First MD Initiated Contact with Patient 01/30/16 1605     Chief Complaint  Patient presents with  . Abdominal Pain     (Consider location/radiation/quality/duration/timing/severity/associated sxs/prior Treatment) HPI Patient presents with one day of gradual onset and slowly worsening left sided abdominal pain. Associated nausea. No previously similar episodes. Patient states she has had constipation but has had a bowel movement today. Denies obstipation. No fever or chills. No vaginal bleeding or discharge. Previous hysterectomy and cesarean section. Patient has ESRD patient and received hemodialysis this morning. Past Medical History  Diagnosis Date  . Dialysis patient ([facility])   . Diabetes mellitus without complication ([facility])   . Coronary artery disease    Past Surgical History  Procedure Laterality Date  . Cesarean section    . Laparoscopic bilateral salpingo oopherectomy    . Cardiac catheterization  02/21/14    done at [facility]-   Family History  Problem Relation Age of Onset  . Heart attack Mother   . Heart attack Father   . Heart attack Maternal Grandmother   . Heart attack Maternal Grandfather   . Hypertension Father    Social History  Substance Use Topics  . Smoking status: Never Smoker   . Smokeless tobacco: None  . Alcohol Use: No   OB History    No data available     Review of Systems  Constitutional: Positive for chills and appetite change. Negative for fever.  Respiratory: Negative for cough and shortness of breath.   Cardiovascular: Negative for chest pain.  Gastrointestinal: Positive for nausea, abdominal pain and constipation. Negative for vomiting and diarrhea.  Genitourinary: Negative for flank pain.  Musculoskeletal: Negative for myalgias and back pain.  Skin: Negative for rash and wound.  Neurological: Negative for dizziness, weakness, light-headedness, numbness and headaches.       Allergies  Norvasc  Home Medications   Prior to Admission medications   Medication Sig Start Date End Date Taking? Authorizing Provider  acetaminophen (TYLENOL) 325 MG tablet Take 325 mg by mouth 2 (two) times daily as needed (pain).    Historical Provider, MD  aspirin 325 MG tablet Take 325 mg by mouth daily.    Historical Provider, MD  bumetanide (BUMEX) 2 MG tablet Take 2 mg by mouth daily.    Historical Provider, MD  calcitRIOL (ROCALTROL) 0.25 MCG capsule Take 0.25 mcg by mouth 4 (four) times a week. Sunday, Monday, Wednesday and Friday    Historical Provider, MD  carvedilol (COREG) 25 MG tablet Take 25 mg by mouth 2 (two) times daily.     Historical Provider, MD  Cholecalciferol (VITAMIN D3) 1000 UNITS CAPS Take by mouth every morning.    Historical Provider, MD  diltiazem (CARDIZEM CD) 120 MG 24 hr capsule Take 120 mg by mouth daily.     Historical Provider, MD  docusate sodium (COLACE) 100 MG capsule Take 100 mg by mouth 2 (two) times daily.    Historical Provider, MD  Ferric Citrate (AURYXIA) 210 MG TABS Take 210 mg by mouth 2 (two) times daily. With lunch and dinner    Historical Provider, MD  glipiZIDE (GLUCOTROL) 10 MG tablet Take 10 mg by mouth daily as needed (If fasting blood sugar > 140).    Historical Provider, MD  heparin 100-0.45 UNIT/ML-% infusion Inject 750 Units/hr into the vein continuous. 02/26/14   Drema Dallas, MD  losartan (COZAAR) 25 MG tablet Take 25  mg by mouth at bedtime.     Historical Provider, MD  Omega-3 Fatty Acids (FISH OIL EXTRA STRENGTH PO) Take 800 mg by mouth 2 (two) times daily.    Historical Provider, MD  Omega-3 Krill Oil 500 MG CAPS Take by mouth.    Historical Provider, MD  ondansetron (ZOFRAN) 4 MG tablet Take 4 mg by mouth daily as needed for nausea or vomiting.    Historical Provider, MD  pravastatin (PRAVACHOL) 40 MG tablet Take 40 mg by mouth at bedtime.    Historical Provider, MD  sevelamer carbonate (RENVELA) 800 MG tablet Take  2,400 mg by mouth 3 (three) times daily with meals.    Historical Provider, MD   BP 203/68 mmHg  Pulse 64  Temp(Src) 98.8 F (37.1 C) (Oral)  Resp 18  Ht 5\' 1"  (1.549 m)  Wt 161 lb (73.029 kg)  BMI 30.44 kg/m2  SpO2 93% Physical Exam  Constitutional: She is oriented to person, place, and time. She appears well-developed and well-nourished. No distress.  HENT:  Head: Normocephalic and atraumatic.  Mouth/Throat: Oropharynx is clear and moist. No oropharyngeal exudate.  Eyes: EOM are normal. Pupils are equal, round, and reactive to light.  Neck: Normal range of motion. Neck supple. No JVD present.  Cardiovascular: Normal rate and regular rhythm.   Pulmonary/Chest: Effort normal and breath sounds normal. No respiratory distress. She has no wheezes. She has no rales. She exhibits no tenderness.  Dialysis cath in the right upper chest. No tenderness or erythema.  Abdominal: Soft. Bowel sounds are normal. She exhibits no distension and no mass. There is tenderness. There is rebound. There is no guarding.  Patient with left lower quadrant tenderness to palpation. Rebound tenderness also noted. She does have right lower quadrant tenderness but secondary to left side of abdomen.  Musculoskeletal: Normal range of motion. She exhibits no edema or tenderness.  No CVA tenderness bilaterally. No lower extremity asymmetry, tenderness or swelling. Distal pulses are equal and intact.  Neurological: She is alert and oriented to person, place, and time.  Moves all extremities without deficit. Sensation is fully intact.  Skin: Skin is warm and dry. No rash noted. No erythema.  Psychiatric: She has a normal mood and affect. Her behavior is normal.  Nursing note and vitals reviewed.   ED Course  Procedures (including critical care time) Labs Review Labs Reviewed  CBC WITH DIFFERENTIAL/PLATELET - Abnormal; Notable for the following:    WBC 13.4 (*)    RBC 2.91 (*)    Hemoglobin 9.4 (*)    HCT 28.7  (*)    Neutro Abs 10.8 (*)    All other components within normal limits  COMPREHENSIVE METABOLIC PANEL - Abnormal; Notable for the following:    Potassium 3.3 (*)    Glucose, Bld 164 (*)    Creatinine, Ser 3.39 (*)    Calcium 8.3 (*)    GFR calc non Af Amer 15 (*)    GFR calc Af Amer 17 (*)    All other components within normal limits  LIPASE, BLOOD - Abnormal; Notable for the following:    Lipase 59 (*)    All other components within normal limits  URINALYSIS, ROUTINE W REFLEX MICROSCOPIC (NOT AT [facility])    Imaging Review No results found. I have personally reviewed and evaluated these images and lab results as part of my medical decision-making.   EKG Interpretation None      MDM   Final diagnoses:  Left lower quadrant  pain  Elevated blood pressure   Concern for diverticulitis. Appendicitis is in the differential but less likely. Believe the patient needs CT scan given peritoneal signs to rule out abscess or perforation. CT scan is not operational at this facility. Unknown repair time. Discussed with Dr. Thomasene Mohair in the [facility] emergency department and will accept the patient in transfer. We'll go ahead and start antibiotics for diverticulitis.    Julianne Rice, MD 01/30/16 [phone]

## 2016-01-31 DIAGNOSIS — K3580 Unspecified acute appendicitis: Secondary | ICD-10-CM | POA: Diagnosis present

## 2016-01-31 LAB — RENAL FUNCTION PANEL
ANION GAP: 16 — AB (ref 5–15)
Albumin: 3 g/dL — ABNORMAL LOW (ref 3.5–5.0)
BUN: 13 mg/dL (ref 6–20)
CALCIUM: 8.1 mg/dL — AB (ref 8.9–10.3)
CO2: 19 mmol/L — AB (ref 22–32)
Chloride: 106 mmol/L (ref 101–111)
Creatinine, Ser: 4.7 mg/dL — ABNORMAL HIGH (ref 0.44–1.00)
GFR calc non Af Amer: 10 mL/min — ABNORMAL LOW (ref >60)
GFR, EST AFRICAN AMERICAN: 12 mL/min — AB (ref >60)
Glucose, Bld: 124 mg/dL — ABNORMAL HIGH (ref 65–99)
PHOSPHORUS: 3.5 mg/dL (ref 2.5–4.6)
Potassium: 4.5 mmol/L (ref 3.5–5.1)
SODIUM: 141 mmol/L (ref 135–145)

## 2016-01-31 LAB — GLUCOSE, CAPILLARY
Glucose-Capillary: 102 mg/dL — ABNORMAL HIGH (ref 65–99)
Glucose-Capillary: 96 mg/dL (ref 65–99)

## 2016-01-31 LAB — CBC
HCT: 26.4 % — ABNORMAL LOW (ref 36.0–46.0)
HEMOGLOBIN: 8.1 g/dL — AB (ref 12.0–15.0)
MCH: 31.2 pg (ref 26.0–34.0)
MCHC: 30.7 g/dL (ref 30.0–36.0)
MCV: 101.5 fL — AB (ref 78.0–100.0)
Platelets: 228 10*3/uL (ref 150–400)
RBC: 2.6 MIL/uL — AB (ref 3.87–5.11)
RDW: 15 % (ref 11.5–15.5)
WBC: 13.2 10*3/uL — ABNORMAL HIGH (ref 4.0–10.5)

## 2016-01-31 LAB — CREATININE, SERUM
Creatinine, Ser: 4.7 mg/dL — ABNORMAL HIGH (ref 0.44–1.00)
GFR calc non Af Amer: 10 mL/min — ABNORMAL LOW (ref >60)
GFR, EST AFRICAN AMERICAN: 12 mL/min — AB (ref >60)

## 2016-01-31 MED ORDER — NEOSTIGMINE METHYLSULFATE 10 MG/10ML IV SOLN
INTRAVENOUS | Status: DC | PRN
  Administered 2016-01-31: 4 mg via INTRAVENOUS

## 2016-01-31 MED ORDER — CARVEDILOL 25 MG PO TABS
25.0000 mg | ORAL_TABLET | Freq: Two times a day (BID) | ORAL | Status: DC
  Administered 2016-01-31 – 2016-02-06 (×11): 25 mg via ORAL
  Filled 2016-01-31 (×11): qty 1

## 2016-01-31 MED ORDER — ONDANSETRON HCL 4 MG/2ML IJ SOLN
4.0000 mg | Freq: Four times a day (QID) | INTRAMUSCULAR | Status: DC | PRN
  Administered 2016-02-03 (×2): 4 mg via INTRAVENOUS
  Filled 2016-01-31 (×2): qty 2

## 2016-01-31 MED ORDER — OXYCODONE HCL 5 MG PO TABS: 5.0000 mg | ORAL_TABLET | Freq: Once | ORAL | Status: DC | PRN

## 2016-01-31 MED ORDER — ONDANSETRON HCL 4 MG/2ML IJ SOLN
INTRAMUSCULAR | Status: DC | PRN
  Administered 2016-01-31: 4 mg via INTRAVENOUS

## 2016-01-31 MED ORDER — OXYCODONE HCL 5 MG/5ML PO SOLN: 5.0000 mg | Freq: Once | ORAL | Status: DC | PRN

## 2016-01-31 MED ORDER — ACETAMINOPHEN 160 MG/5ML PO SOLN
325.0000 mg | ORAL | Status: DC | PRN
  Filled 2016-01-31: qty 20.3

## 2016-01-31 MED ORDER — CEFEPIME HCL 2 G IJ SOLR
2.0000 g | INTRAMUSCULAR | Status: DC
  Administered 2016-01-31 – 2016-02-04 (×3): 2 g via INTRAVENOUS
  Filled 2016-01-31 (×3): qty 2

## 2016-01-31 MED ORDER — DOCUSATE SODIUM 100 MG PO CAPS
100.0000 mg | ORAL_CAPSULE | Freq: Two times a day (BID) | ORAL | Status: DC | PRN
  Filled 2016-01-31: qty 1

## 2016-01-31 MED ORDER — HEPARIN SODIUM (PORCINE) 5000 UNIT/ML IJ SOLN
5000.0000 [IU] | Freq: Three times a day (TID) | INTRAMUSCULAR | Status: DC
  Administered 2016-01-31 – 2016-02-06 (×18): 5000 [IU] via SUBCUTANEOUS
  Filled 2016-01-31 (×17): qty 1

## 2016-01-31 MED ORDER — SEVELAMER CARBONATE 800 MG PO TABS
2400.0000 mg | ORAL_TABLET | Freq: Three times a day (TID) | ORAL | Status: DC
  Administered 2016-01-31 – 2016-02-06 (×13): 2400 mg via ORAL
  Filled 2016-01-31 (×14): qty 3

## 2016-01-31 MED ORDER — OXYCODONE HCL 5 MG PO TABS: 5.0000 mg | ORAL_TABLET | ORAL | Status: DC | PRN

## 2016-01-31 MED ORDER — LOSARTAN POTASSIUM 50 MG PO TABS
50.0000 mg | ORAL_TABLET | Freq: Every day | ORAL | Status: DC
  Administered 2016-01-31 – 2016-02-03 (×3): 50 mg via ORAL
  Filled 2016-01-31 (×4): qty 1

## 2016-01-31 MED ORDER — GLIPIZIDE 10 MG PO TABS
10.0000 mg | ORAL_TABLET | Freq: Every day | ORAL | Status: DC
  Administered 2016-01-31: 10 mg via ORAL
  Filled 2016-01-31 (×2): qty 1

## 2016-01-31 MED ORDER — GLYCOPYRROLATE 0.2 MG/ML IJ SOLN
INTRAMUSCULAR | Status: DC | PRN
  Administered 2016-01-31: 0.6 mg via INTRAVENOUS

## 2016-01-31 MED ORDER — DOCUSATE SODIUM 100 MG PO CAPS
100.0000 mg | ORAL_CAPSULE | Freq: Two times a day (BID) | ORAL | Status: DC
  Administered 2016-01-31 – 2016-02-06 (×12): 100 mg via ORAL
  Filled 2016-01-31 (×11): qty 1

## 2016-01-31 MED ORDER — ACETAMINOPHEN 325 MG PO TABS: 325.0000 mg | ORAL_TABLET | ORAL | Status: DC | PRN

## 2016-01-31 MED ORDER — INSULIN GLARGINE 100 UNIT/ML ~~LOC~~ SOLN
15.0000 [IU] | Freq: Every day | SUBCUTANEOUS | Status: DC
  Administered 2016-01-31: 15 [IU] via SUBCUTANEOUS
  Filled 2016-01-31 (×2): qty 0.15

## 2016-01-31 MED ORDER — ACETAMINOPHEN 325 MG PO TABS
325.0000 mg | ORAL_TABLET | Freq: Two times a day (BID) | ORAL | Status: DC | PRN
  Administered 2016-01-31 – 2016-02-01 (×2): 325 mg via ORAL
  Filled 2016-01-31 (×3): qty 1

## 2016-01-31 MED ORDER — FERRIC CITRATE 1 GM 210 MG(FE) PO TABS
210.0000 mg | ORAL_TABLET | Freq: Three times a day (TID) | ORAL | Status: DC
  Administered 2016-02-03 – 2016-02-06 (×8): 210 mg via ORAL
  Filled 2016-01-31 (×7): qty 1

## 2016-01-31 MED ORDER — HYDRALAZINE HCL 50 MG PO TABS
100.0000 mg | ORAL_TABLET | Freq: Three times a day (TID) | ORAL | Status: DC
  Administered 2016-01-31 – 2016-02-06 (×17): 100 mg via ORAL
  Filled 2016-01-31 (×17): qty 2

## 2016-01-31 MED ORDER — ONDANSETRON 4 MG PO TBDP
4.0000 mg | ORAL_TABLET | Freq: Four times a day (QID) | ORAL | Status: DC | PRN
  Administered 2016-02-05: 4 mg via ORAL
  Filled 2016-01-31 (×2): qty 1

## 2016-01-31 MED ORDER — DOCOSAHEXAENOIC ACID 200 MG PO CAPS: ORAL_CAPSULE | ORAL | Status: DC

## 2016-01-31 MED ORDER — OMEGA-3-ACID ETHYL ESTERS 1 G PO CAPS
1000.0000 mg | ORAL_CAPSULE | Freq: Every day | ORAL | Status: DC
  Administered 2016-01-31 – 2016-02-06 (×5): 1000 mg via ORAL
  Filled 2016-01-31 (×5): qty 1

## 2016-01-31 MED ORDER — BUMETANIDE 2 MG PO TABS
2.0000 mg | ORAL_TABLET | Freq: Every day | ORAL | Status: DC
  Administered 2016-01-31 – 2016-02-06 (×5): 2 mg via ORAL
  Filled 2016-01-31 (×8): qty 1

## 2016-01-31 MED ORDER — PRAVASTATIN SODIUM 40 MG PO TABS
40.0000 mg | ORAL_TABLET | Freq: Every day | ORAL | Status: DC
  Administered 2016-01-31 – 2016-02-05 (×7): 40 mg via ORAL
  Filled 2016-01-31 (×7): qty 1

## 2016-01-31 MED ORDER — MORPHINE SULFATE (PF) 4 MG/ML IV SOLN
4.0000 mg | INTRAVENOUS | Status: DC | PRN
  Administered 2016-01-31: 4 mg via INTRAVENOUS
  Filled 2016-01-31: qty 1

## 2016-01-31 MED ORDER — OXYCODONE HCL 5 MG PO TABS
5.0000 mg | ORAL_TABLET | ORAL | Status: DC | PRN
  Administered 2016-01-31 (×2): 5 mg via ORAL
  Administered 2016-01-31: 10 mg via ORAL
  Filled 2016-01-31 (×2): qty 1
  Filled 2016-01-31: qty 2

## 2016-01-31 MED ORDER — DILTIAZEM HCL ER COATED BEADS 240 MG PO CP24
240.0000 mg | ORAL_CAPSULE | Freq: Two times a day (BID) | ORAL | Status: DC
  Administered 2016-01-31 – 2016-02-01 (×4): 240 mg via ORAL
  Filled 2016-01-31 (×4): qty 1

## 2016-01-31 MED ORDER — SODIUM BICARBONATE 650 MG PO TABS
650.0000 mg | ORAL_TABLET | Freq: Every day | ORAL | Status: DC
  Administered 2016-01-31 – 2016-02-05 (×7): 650 mg via ORAL
  Filled 2016-01-31 (×7): qty 1

## 2016-01-31 MED ORDER — FENTANYL CITRATE (PF) 100 MCG/2ML IJ SOLN: 25.0000 ug | INTRAMUSCULAR | Status: DC | PRN

## 2016-01-31 MED ORDER — METRONIDAZOLE IN NACL 5-0.79 MG/ML-% IV SOLN
500.0000 mg | Freq: Three times a day (TID) | INTRAVENOUS | Status: DC
  Administered 2016-01-31 – 2016-02-05 (×16): 500 mg via INTRAVENOUS
  Filled 2016-01-31 (×21): qty 100

## 2016-01-31 MED ORDER — MORPHINE SULFATE (PF) 2 MG/ML IV SOLN
2.0000 mg | INTRAVENOUS | Status: DC | PRN
  Administered 2016-01-31: 2 mg via INTRAVENOUS
  Filled 2016-01-31: qty 1

## 2016-01-31 NOTE — Progress Notes (Signed)
Patient ID: Isabel Sharp, female   DOB: [birth year], 45 y.o.   MRN: [id number] 1 Day Post-Op  Subjective: She feels better than preop. Just sore. Tolerating liquid diet. She asked about going home today.  Objective: Vital signs in last 24 hours: Temp:  [97.3 F (36.3 C)-100.2 F (37.9 C)] 99.3 F (37.4 C) (04/29 1002) Pulse Rate:  [58-88] 73 (04/29 1002) Resp:  [16-19] 18 (04/29 1002) BP: (147-223)/(49-114) 153/49 mmHg (04/29 1002) SpO2:  [93 %-100 %] 100 % (04/29 1002) Weight:  [73.029 kg (161 lb)] 73.029 kg (161 lb) (04/28 1537) Last BM Date: 01/30/16  Intake/Output from previous day: 04/28 0701 - 04/29 0700 In: 270 [P.O.:120; IV Piggyback:150] Out: 100 [Blood:100] Intake/Output this shift: Total I/O In: 240 [P.O.:240] Out: -   General appearance: alert, cooperative and no distress GI: mild appropriate incisional tenderness Incision/Wound: clean and dry  Lab Results:   Recent Labs  01/30/16 1658 01/31/16 0750  WBC 13.4* 13.2*  HGB 9.4* 8.1*  HCT 28.7* 26.4*  PLT 259 228   BMET  Recent Labs  01/30/16 1658 01/31/16 0631 01/31/16 1000  NA 138  --  141  K 3.3*  --  4.5  CL 102  --  106  CO2 31  --  19*  GLUCOSE 164*  --  124*  BUN 10  --  13  CREATININE 3.39* 4.70* 4.70*  CALCIUM 8.3*  --  8.1*     Studies/Results: Ct Abdomen Pelvis Wo Contrast  01/30/2016  CLINICAL DATA:  Acute onset of left lower quadrant and right lower quadrant abdominal pain. Initial encounter. EXAM: CT ABDOMEN AND PELVIS WITHOUT CONTRAST TECHNIQUE: Multidetector CT imaging of the abdomen and pelvis was performed following the standard protocol without IV contrast. COMPARISON:  None. FINDINGS: The visualized lung bases are clear. The patient is status post median sternotomy. Scattered coronary artery calcifications are seen. The liver and spleen are unremarkable in appearance. The gallbladder is within normal limits. The pancreas and adrenal glands are unremarkable. Nonspecific  perinephric stranding is noted bilaterally. Scattered bilateral calcifications are noted along the renal arteries and their branches. There is no evidence of hydronephrosis. No renal or ureteral stones are identified. A 2.1 cm cyst is noted at the posterior aspect of the left kidney. The small bowel is unremarkable in appearance. The stomach is within normal limits. No acute vascular abnormalities are seen. Scattered calcification is seen along the abdominal aorta and its branches, including along the superior mesenteric artery and inferior mesenteric artery. The appendix is dilated 1.2 cm in maximal diameter, with suggestion of a 1.2 cm appendicolith near the base of the appendix, and mild surrounding soft tissue inflammation. This raises concern for mild acute appendicitis. There is no evidence of perforation or abscess formation at this time. Trace underlying fluid is seen. The colon is largely decompressed and is grossly unremarkable in appearance. The bladder is mildly distended and grossly unremarkable. The uterus is unremarkable in appearance. The ovaries are relatively symmetric. No suspicious adnexal masses are seen. No inguinal lymphadenopathy is seen. No acute osseous abnormalities are identified. IMPRESSION: 1. Mild acute appendicitis, with dilatation of the appendix to 1.2 cm in maximal diameter, and suggestion of 1.2 cm appendicolith near the base of the appendix. Mild surrounding soft tissue inflammation noted. No evidence of perforation or abscess formation at this time. 2. Scattered calcification along the abdominal aorta and its branches, including along the superior mesenteric artery and inferior mesenteric artery. Scattered calcifications along the renal arteries and their  branches. 3. Scattered coronary artery calcifications seen. 4. Small left renal cyst noted. These results were called by telephone at the time of interpretation on 01/30/2016 at 8:40 pm to Dr. Rockne Menghini, who verbally  acknowledged these results. Electronically Signed   By: Garald Balding M.D.   On: 01/30/2016 20:41    Anti-infectives: Anti-infectives    Start     Dose/Rate Route Frequency Ordered Stop   01/31/16 0300  ceFEPIme (MAXIPIME) 2 g in dextrose 5 % 50 mL IVPB     2 g 100 mL/hr over 30 Minutes Intravenous Every M-W-F (Hemodialysis) 01/31/16 0156     01/31/16 0300  metroNIDAZOLE (FLAGYL) IVPB 500 mg     500 mg 100 mL/hr over 60 Minutes Intravenous Every 8 hours 01/31/16 0156     01/30/16 2215  cefTRIAXone (ROCEPHIN) 1 g in dextrose 5 % 50 mL IVPB    Comments:  On call for OR   1 g 100 mL/hr over 30 Minutes Intravenous  Once 01/30/16 2211     01/30/16 2215  metroNIDAZOLE (FLAGYL) IVPB 500 mg    Comments:  On call for OR   500 mg 100 mL/hr over 60 Minutes Intravenous  Once 01/30/16 2211     01/30/16 1645  ciprofloxacin (CIPRO) IVPB 400 mg     400 mg 200 mL/hr over 60 Minutes Intravenous  Once 01/30/16 1641 01/30/16 1745   01/30/16 1645  metroNIDAZOLE (FLAGYL) IVPB 500 mg     500 mg 100 mL/hr over 60 Minutes Intravenous  Once 01/30/16 1641 01/30/16 1906      Assessment/Plan: s/p Procedure(s): APPENDECTOMY LAPAROSCOPIC Doing well postoperatively without apparent complication. End-stage renal disease, appreciate nephrology consult, hemodialysis this a.m. Possibly home later today if  continues to improve      Martina Brodbeck T 01/31/2016

## 2016-01-31 NOTE — Discharge Instructions (Signed)
[facility] ______[facility] °LAPAROSCOPIC SURGERY: POST OP INSTRUCTIONS °Always review your discharge instruction sheet given to you by the facility where your surgery was performed. °IF YOU HAVE DISABILITY OR FAMILY LEAVE FORMS, YOU MUST BRING THEM TO THE OFFICE FOR PROCESSING.   °DO NOT GIVE THEM TO YOUR DOCTOR. ° °1. A prescription for pain medication may be given to you upon discharge.  Take your pain medication as prescribed, if needed.  If narcotic pain medicine is not needed, then you may take acetaminophen (Tylenol) or ibuprofen (Advil) as needed. °2. Take your usually prescribed medications unless otherwise directed. °3. If you need a refill on your pain medication, please contact your pharmacy.  They will contact our office to request authorization. Prescriptions will not be filled after 5pm or on week-ends. °4. You should follow a light diet the first few days after arrival home, such as soup and crackers, etc.  Be sure to include lots of fluids daily. °5. Most patients will experience some swelling and bruising in the area of the incisions.  Ice packs will help.  Swelling and bruising can take several days to resolve.  °6. It is common to experience some constipation if taking pain medication after surgery.  Increasing fluid intake and taking a stool softener (such as Colace) will usually help or prevent this problem from occurring.  A mild laxative (Milk of Magnesia or Miralax) should be taken according to package instructions if there are no bowel movements after 48 hours. °7. Unless discharge instructions indicate otherwise, you may remove your bandages 24-48 hours after surgery, and you may shower at that time.  You may have steri-strips (small skin tapes) in place directly over the incision.  These strips should be left on the skin for 7-10 days.  If your surgeon used skin glue on the incision, you may shower in 24 hours.  The glue will flake off over the next 2-3 weeks.  Any sutures or  staples will be removed at the office during your follow-up visit. °8. ACTIVITIES:  You may resume regular (light) daily activities beginning the next day--such as daily self-care, walking, climbing stairs--gradually increasing activities as tolerated.  You may have sexual intercourse when it is comfortable.  Refrain from any heavy lifting or straining until approved by your doctor. °a. You may drive when you are no longer taking prescription pain medication, you can comfortably wear a seatbelt, and you can safely maneuver your car and apply brakes. °b. RETURN TO WORK:  __________________________________________________________ °9. You should see your doctor in the office for a follow-up appointment approximately 2-3 weeks after your surgery.  Make sure that you call for this appointment within a day or two after you arrive home to insure a convenient appointment time. °10. OTHER INSTRUCTIONS: __________________________________________________________________________________________________________________________ __________________________________________________________________________________________________________________________ °WHEN TO CALL YOUR DOCTOR: °1. Fever over 101.0 °2. Inability to urinate °3. Continued bleeding from incision. °4. Increased pain, redness, or drainage from the incision. °5. Increasing abdominal pain ° °The clinic staff is available to answer your questions during regular business hours.  Please don’t hesitate to call and ask to speak to one of the nurses for clinical concerns.  If you have a medical emergency, go to the nearest emergency room or call 911.  A surgeon from [facility] is always on call at the hospital. °[address], [address], [place], [place]  [address] ? [address] [address], [place], [place]   [address] °[phone] ? [phone] ? FAX [phone] °Web site:   [email or web address] °

## 2016-01-31 NOTE — Progress Notes (Signed)
Notified Dr. Ninfa Linden of patient's temp of 102.5. Told him she is using her IS and was medicated with $RemoveBefore'325mg'[id number]$  Tylenol.  No new orders.

## 2016-01-31 NOTE — Progress Notes (Signed)
New admit fr PACU s/p lap AP alert and oriented with 3 lap sites skin glued no bleeding noted, will continue to monitor.

## 2016-01-31 NOTE — Transfer of Care (Signed)
Immediate Anesthesia Transfer of Care Note  Patient: Isabel Sharp  Procedure(s) Performed: Procedure(s): APPENDECTOMY LAPAROSCOPIC (N/A)  Patient Location: PACU  Anesthesia Type:General  Level of Consciousness: sedated and patient cooperative  Airway & Oxygen Therapy: Patient connected to nasal cannula oxygen  Post-op Assessment: Report given to RN and Post -op Vital signs reviewed and stable  Post vital signs: Reviewed and stable  Last Vitals:  Filed Vitals:   01/30/16 2230 01/31/16 0102  BP: 188/88 194/69  Pulse: 88 68  Temp: 37.1 C   Resp: 16 18    Last Pain:  Filed Vitals:   01/31/16 0105  PainSc: 6          Complications: No apparent anesthesia complications

## 2016-01-31 NOTE — Consult Note (Signed)
Reason for Consult: ESRD Referring Physician:  Dr. Kieth Brightly  Chief Complaint: abdominal pain  Assessment/Plan: 1 Appendicitis s/p lap appendectomy early this AM (4/29) - pt already feeling much better. 2 ESRD: MWF at [facility] w/ Dr. Sudie Bailey, last HD Wed. EDW is 165kg currently utilizing a RIJ cath. Left BCF has a nice thrill but she is supposed to see a surgeon bec of a recent infiltration. She has button holes but those will certainly need to be re-established. - HD this AM. 3 Hypertension: Restart home meds and will try to get her to EDW. 4. Anemia of ESRD: Stable 5. Metabolic Bone Disease: Will check and manage iPTH as outpt.  Dialyzes at [facility] in [place] Primary Nephrologist: Dr. Sudie Bailey EDW  165 lb  HPI: Isabel Sharp is a 45 y.o. female ESRD MWF in [facility] [place] w/ Dr. Sudie Bailey with last HD on Wednesday. She presents with left sided pain which migrated to the right side with nausea as well but denied vomiting. She had CT A/P + physical exam findings c/w appendicitis and had a lap appendectomy early on 4/29 by Dr. Kieth Brightly.  Appendix was not ruptured w a firm appendix; appendicolith was removed after an endoloop failed. At time of consultation pt already feeling much better.   Past Medical History  Diagnosis Date  . Dialysis patient ([place])   . Diabetes mellitus without complication ([place])   . Coronary artery disease    Allergies:  Allergies  Allergen Reactions  . Norvasc [Amlodipine] Swelling    Past Surgical History  Procedure Laterality Date  . Cesarean section    . Laparoscopic bilateral salpingo oopherectomy    . Cardiac catheterization  02/21/14    done at [facility]-    Family History  Problem Relation Age of Onset  . Heart attack Mother   . Heart attack Father   . Heart attack Maternal Grandmother   . Heart attack Maternal Grandfather   . Hypertension Father     Social History:  reports that she has never smoked. She does not have any smokeless  tobacco history on file. She reports that she does not drink alcohol or use illicit drugs.  ROS Pertinent items are noted in HPI.  Blood pressure 176/64, pulse 67, temperature 100.2 F (37.9 C), temperature source Oral, resp. rate 19, height 5' 1" (1.549 m), weight 73.029 kg (161 lb), SpO2 100 %. General appearance: alert, cooperative and appears stated age Head: Normocephalic, without obvious abnormality, atraumatic Eyes: negative Neck: no adenopathy, no carotid bruit, no JVD, supple, symmetrical, trachea midline and thyroid not enlarged, symmetric, no tenderness/mass/nodules Back: symmetric, no curvature. ROM normal. No CVA tenderness. Resp: clear to auscultation bilaterally Chest wall: no tenderness Cardio: regular rate and rhythm, S1, S2 normal, no murmur, click, rub or gallop GI: Soft Extremities: extremities normal, atraumatic, no cyanosis or edema Pulses: 2+ and symmetric Skin: Skin color, texture, turgor normal. No rashes or lesions Lymph nodes: Cervical, supraclavicular, and axillary nodes normal. Neurologic: Grossly normal  Access: Lt BCF w/ good thrill; RIJ cath  Results for orders placed or performed during the hospital encounter of 01/30/16 (from the past 48 hour(s))  CBC with Differential/Platelet     Status: Abnormal   Collection Time: 01/30/16  4:58 PM  Result Value Ref Range   WBC 13.4 (H) 4.0 - 10.5 K/uL   RBC 2.91 (L) 3.87 - 5.11 MIL/uL   Hemoglobin 9.4 (L) 12.0 - 15.0 g/dL   HCT 28.7 (L) 36.0 - 46.0 %  MCV 98.6 78.0 - 100.0 fL   MCH 32.3 26.0 - 34.0 pg   MCHC 32.8 30.0 - 36.0 g/dL   RDW 14.1 11.5 - 15.5 %   Platelets 259 150 - 400 K/uL   Neutrophils Relative % 81 %   Neutro Abs 10.8 (H) 1.7 - 7.7 K/uL   Lymphocytes Relative 9 %   Lymphs Abs 1.2 0.7 - 4.0 K/uL   Monocytes Relative 5 %   Monocytes Absolute 0.7 0.1 - 1.0 K/uL   Eosinophils Relative 5 %   Eosinophils Absolute 0.7 0.0 - 0.7 K/uL   Basophils Relative 0 %   Basophils Absolute 0.0 0.0 - 0.1  K/uL  Comprehensive metabolic panel     Status: Abnormal   Collection Time: 01/30/16  4:58 PM  Result Value Ref Range   Sodium 138 135 - 145 mmol/L   Potassium 3.3 (L) 3.5 - 5.1 mmol/L   Chloride 102 101 - 111 mmol/L   CO2 31 22 - 32 mmol/L   Glucose, Bld 164 (H) 65 - 99 mg/dL   BUN 10 6 - 20 mg/dL   Creatinine, Ser 3.39 (H) 0.44 - 1.00 mg/dL   Calcium 8.3 (L) 8.9 - 10.3 mg/dL   Total Protein 7.3 6.5 - 8.1 g/dL   Albumin 3.6 3.5 - 5.0 g/dL   AST 17 15 - 41 U/L   ALT 17 14 - 54 U/L   Alkaline Phosphatase 92 38 - 126 U/L   Total Bilirubin 0.4 0.3 - 1.2 mg/dL   GFR calc non Af Amer 15 (L) >60 mL/min   GFR calc Af Amer 17 (L) >60 mL/min    Comment: (NOTE) The eGFR has been calculated using the CKD EPI equation. This calculation has not been validated in all clinical situations. eGFR's persistently <60 mL/min signify possible Chronic Kidney Disease.    Anion gap 5 5 - 15  Lipase, blood     Status: Abnormal   Collection Time: 01/30/16  4:58 PM  Result Value Ref Range   Lipase 59 (H) 11 - 51 U/L    Ct Abdomen Pelvis Wo Contrast  01/30/2016  CLINICAL DATA:  Acute onset of left lower quadrant and right lower quadrant abdominal pain. Initial encounter. EXAM: CT ABDOMEN AND PELVIS WITHOUT CONTRAST TECHNIQUE: Multidetector CT imaging of the abdomen and pelvis was performed following the standard protocol without IV contrast. COMPARISON:  None. FINDINGS: The visualized lung bases are clear. The patient is status post median sternotomy. Scattered coronary artery calcifications are seen. The liver and spleen are unremarkable in appearance. The gallbladder is within normal limits. The pancreas and adrenal glands are unremarkable. Nonspecific perinephric stranding is noted bilaterally. Scattered bilateral calcifications are noted along the renal arteries and their branches. There is no evidence of hydronephrosis. No renal or ureteral stones are identified. A 2.1 cm cyst is noted at the posterior  aspect of the left kidney. The small bowel is unremarkable in appearance. The stomach is within normal limits. No acute vascular abnormalities are seen. Scattered calcification is seen along the abdominal aorta and its branches, including along the superior mesenteric artery and inferior mesenteric artery. The appendix is dilated 1.2 cm in maximal diameter, with suggestion of a 1.2 cm appendicolith near the base of the appendix, and mild surrounding soft tissue inflammation. This raises concern for mild acute appendicitis. There is no evidence of perforation or abscess formation at this time. Trace underlying fluid is seen. The colon is largely decompressed and is grossly unremarkable in  appearance. The bladder is mildly distended and grossly unremarkable. The uterus is unremarkable in appearance. The ovaries are relatively symmetric. No suspicious adnexal masses are seen. No inguinal lymphadenopathy is seen. No acute osseous abnormalities are identified. IMPRESSION: 1. Mild acute appendicitis, with dilatation of the appendix to 1.2 cm in maximal diameter, and suggestion of 1.2 cm appendicolith near the base of the appendix. Mild surrounding soft tissue inflammation noted. No evidence of perforation or abscess formation at this time. 2. Scattered calcification along the abdominal aorta and its branches, including along the superior mesenteric artery and inferior mesenteric artery. Scattered calcifications along the renal arteries and their branches. 3. Scattered coronary artery calcifications seen. 4. Small left renal cyst noted. These results were called by telephone at the time of interpretation on 01/30/2016 at 8:40 pm to Dr. WARREN JONES, who verbally acknowledged these results. Electronically Signed   By: Jeffery  Chang M.D.   On: 01/30/2016 20:41     LIN, JAMES W, MD 01/31/2016, 6:19 AM  

## 2016-01-31 NOTE — Op Note (Signed)
Preoperative diagnosis: accute appendicitis with peritonitis  Postoperative diagnosis: Same   Procedure: laparoscopic appendectomy, laparoscopic lysis of adhesions  Surgeon: Gurney Maxin, M.D.  Asst: none  Anesthesia: Gen.   Indications for procedure: Isabel Sharp is a 45 y.o. female with symptoms of pain in right lower quadrant and nausea consistent with acute appendicitis. Confirmed by CT scan and laboratory values.  Description of procedure: The patient was brought into the operative suite, placed supine. Anesthesia was administered with endotracheal tube. The patient's left arm was tucked. All pressure points were offloaded by foam padding. The patient was prepped and draped in the usual sterile fashion.  A small incision was made to the right of the umbilicus. A 65mm optical trocar was used to gain access to the peritoneum. Pneumoperitoneum was applied with high flow low pressure.  1 28mm trocars were placed in the left upper quadrant, 1 18mm trocar was placed in the left lower quadrant. All trocars sites were first anesthesized with 0.25% marcaine with epinephrine. There was a large amount of omentum was adhesed to the lower abdominal wall. This was dissected free with cautery and scissors. Next the patient was placed in trendelenberg, rotated to the left. The omentum was retracted cephalad. The cecum and appendix were identified. The appendix was firm but not perforated. The base of the appendix was dissected and a window through the mesoappendix was created with blunt dissection. Cautery was used to dissect the mesoappendix away and 2 0 PDS endoloops were placed at the base of the appendix. The appendix was then cut with scissors. The distal endoloop failed and an appendicolith fell out of the appendix specimen, this was collected and removed.  The appendix was placed in a specimen bag. The proximal endoloop failed and the base of the appendix was resealed with a 62mm blue load stapler.  The pelvis and RLQ were irrigated. Cautery was applied to the abdomen wall to ensure hemostasis. The appendix was removed via the 69mm trocar. 0 vicryl was used to close the fascial defect. Pneumoperitoneum was removed, all trocars were removed. All incisions were closed with 4-0 monocryl subcuticular stitch. The patient woke from anesthesia and was brought to PACU in stable condition.  Findings: acute appendicitis, adhesions of omentum to the abdominal wall.  Specimen: appendix  Blood loss: 175ml  Local anesthesia: 63ml 0.25% marcaine w epi  Complications: leakage of appendicolith (collected and removed)  Gurney Maxin, M.D. General, Bariatric, & Minimally Invasive Surgery [facility]

## 2016-01-31 NOTE — Anesthesia Postprocedure Evaluation (Signed)
Anesthesia Post Note  Patient: Isabel Sharp  Procedure(s) Performed: Procedure(s) (LRB): APPENDECTOMY LAPAROSCOPIC (N/A)  Patient location during evaluation: PACU Anesthesia Type: General Level of consciousness: awake Pain management: pain level controlled Vital Signs Assessment: post-procedure vital signs reviewed and stable Respiratory status: spontaneous breathing Cardiovascular status: stable Postop Assessment: no signs of nausea or vomiting Anesthetic complications: no    Last Vitals:  Filed Vitals:   01/31/16 0115 01/31/16 0130  BP: 184/66 190/61  Pulse: 64 62  Temp:  36.4 C  Resp: 17 18    Last Pain:  Filed Vitals:   01/31/16 0300  PainSc: 9                  Barbie Croston

## 2016-02-01 ENCOUNTER — Observation Stay (HOSPITAL_COMMUNITY): Payer: BLUE CROSS/BLUE SHIELD

## 2016-02-01 ENCOUNTER — Encounter (HOSPITAL_COMMUNITY): Payer: Self-pay | Admitting: Cardiology

## 2016-02-01 DIAGNOSIS — Z6830 Body mass index (BMI) 30.0-30.9, adult: Secondary | ICD-10-CM | POA: Diagnosis not present

## 2016-02-01 DIAGNOSIS — D631 Anemia in chronic kidney disease: Secondary | ICD-10-CM | POA: Diagnosis present

## 2016-02-01 DIAGNOSIS — Z794 Long term (current) use of insulin: Secondary | ICD-10-CM | POA: Diagnosis not present

## 2016-02-01 DIAGNOSIS — K660 Peritoneal adhesions (postprocedural) (postinfection): Secondary | ICD-10-CM | POA: Diagnosis present

## 2016-02-01 DIAGNOSIS — E1122 Type 2 diabetes mellitus with diabetic chronic kidney disease: Secondary | ICD-10-CM

## 2016-02-01 DIAGNOSIS — R509 Fever, unspecified: Secondary | ICD-10-CM | POA: Diagnosis present

## 2016-02-01 DIAGNOSIS — N2581 Secondary hyperparathyroidism of renal origin: Secondary | ICD-10-CM | POA: Diagnosis present

## 2016-02-01 DIAGNOSIS — E118 Type 2 diabetes mellitus with unspecified complications: Secondary | ICD-10-CM | POA: Diagnosis not present

## 2016-02-01 DIAGNOSIS — I429 Cardiomyopathy, unspecified: Secondary | ICD-10-CM | POA: Diagnosis present

## 2016-02-01 DIAGNOSIS — R6510 Systemic inflammatory response syndrome (SIRS) of non-infectious origin without acute organ dysfunction: Secondary | ICD-10-CM | POA: Diagnosis not present

## 2016-02-01 DIAGNOSIS — I739 Peripheral vascular disease, unspecified: Secondary | ICD-10-CM

## 2016-02-01 DIAGNOSIS — I5022 Chronic systolic (congestive) heart failure: Secondary | ICD-10-CM | POA: Diagnosis present

## 2016-02-01 DIAGNOSIS — Z888 Allergy status to other drugs, medicaments and biological substances status: Secondary | ICD-10-CM | POA: Diagnosis not present

## 2016-02-01 DIAGNOSIS — G9340 Encephalopathy, unspecified: Secondary | ICD-10-CM | POA: Diagnosis present

## 2016-02-01 DIAGNOSIS — I779 Disorder of arteries and arterioles, unspecified: Secondary | ICD-10-CM | POA: Insufficient documentation

## 2016-02-01 DIAGNOSIS — R400 Somnolence: Secondary | ICD-10-CM | POA: Diagnosis not present

## 2016-02-01 DIAGNOSIS — I110 Hypertensive heart disease with heart failure: Secondary | ICD-10-CM | POA: Diagnosis present

## 2016-02-01 DIAGNOSIS — N186 End stage renal disease: Secondary | ICD-10-CM

## 2016-02-01 DIAGNOSIS — R7881 Bacteremia: Secondary | ICD-10-CM | POA: Diagnosis not present

## 2016-02-01 DIAGNOSIS — E1165 Type 2 diabetes mellitus with hyperglycemia: Secondary | ICD-10-CM | POA: Diagnosis present

## 2016-02-01 DIAGNOSIS — E875 Hyperkalemia: Secondary | ICD-10-CM | POA: Diagnosis present

## 2016-02-01 DIAGNOSIS — I2510 Atherosclerotic heart disease of native coronary artery without angina pectoris: Secondary | ICD-10-CM | POA: Diagnosis present

## 2016-02-01 DIAGNOSIS — R7989 Other specified abnormal findings of blood chemistry: Secondary | ICD-10-CM

## 2016-02-01 DIAGNOSIS — Z951 Presence of aortocoronary bypass graft: Secondary | ICD-10-CM | POA: Diagnosis not present

## 2016-02-01 DIAGNOSIS — E785 Hyperlipidemia, unspecified: Secondary | ICD-10-CM | POA: Insufficient documentation

## 2016-02-01 DIAGNOSIS — I252 Old myocardial infarction: Secondary | ICD-10-CM | POA: Diagnosis not present

## 2016-02-01 DIAGNOSIS — R079 Chest pain, unspecified: Secondary | ICD-10-CM | POA: Diagnosis not present

## 2016-02-01 DIAGNOSIS — Z7982 Long term (current) use of aspirin: Secondary | ICD-10-CM | POA: Diagnosis not present

## 2016-02-01 DIAGNOSIS — E11649 Type 2 diabetes mellitus with hypoglycemia without coma: Secondary | ICD-10-CM | POA: Diagnosis present

## 2016-02-01 DIAGNOSIS — K353 Acute appendicitis with localized peritonitis: Secondary | ICD-10-CM | POA: Diagnosis present

## 2016-02-01 DIAGNOSIS — G9341 Metabolic encephalopathy: Secondary | ICD-10-CM | POA: Diagnosis present

## 2016-02-01 DIAGNOSIS — Z992 Dependence on renal dialysis: Secondary | ICD-10-CM | POA: Diagnosis not present

## 2016-02-01 DIAGNOSIS — I248 Other forms of acute ischemic heart disease: Secondary | ICD-10-CM | POA: Diagnosis not present

## 2016-02-01 LAB — URINALYSIS, ROUTINE W REFLEX MICROSCOPIC
Bilirubin Urine: NEGATIVE
GLUCOSE, UA: 100 mg/dL — AB
HGB URINE DIPSTICK: NEGATIVE
Ketones, ur: NEGATIVE mg/dL
Leukocytes, UA: NEGATIVE
Nitrite: NEGATIVE
PH: 7.5 (ref 5.0–8.0)
Protein, ur: >300 mg/dL — AB
SPECIFIC GRAVITY, URINE: 1.019 (ref 1.005–1.030)

## 2016-02-01 LAB — CBC
HCT: 23.7 % — ABNORMAL LOW (ref 36.0–46.0)
HEMATOCRIT: 24.3 % — AB (ref 36.0–46.0)
HEMOGLOBIN: 7.6 g/dL — AB (ref 12.0–15.0)
Hemoglobin: 7.4 g/dL — ABNORMAL LOW (ref 12.0–15.0)
MCH: 32.1 pg (ref 26.0–34.0)
MCH: 31.9 pg (ref 26.0–34.0)
MCHC: 31.3 g/dL (ref 30.0–36.0)
MCHC: 31.2 g/dL (ref 30.0–36.0)
MCV: 102.5 fL — ABNORMAL HIGH (ref 78.0–100.0)
MCV: 102.2 fL — ABNORMAL HIGH (ref 78.0–100.0)
PLATELETS: 190 10*3/uL (ref 150–400)
Platelets: 206 10*3/uL (ref 150–400)
RBC: 2.37 MIL/uL — AB (ref 3.87–5.11)
RBC: 2.32 MIL/uL — AB (ref 3.87–5.11)
RDW: 14.7 % (ref 11.5–15.5)
RDW: 14.7 % (ref 11.5–15.5)
WBC: 12 10*3/uL — AB (ref 4.0–10.5)
WBC: 11.4 10*3/uL — AB (ref 4.0–10.5)

## 2016-02-01 LAB — LACTIC ACID, PLASMA
LACTIC ACID, VENOUS: 1.1 mmol/L (ref 0.5–2.0)
LACTIC ACID, VENOUS: 0.7 mmol/L (ref 0.5–2.0)

## 2016-02-01 LAB — BLOOD GAS, ARTERIAL
Acid-Base Excess: 0.8 mmol/L (ref 0.0–2.0)
BICARBONATE: 24.7 meq/L — AB (ref 20.0–24.0)
Drawn by: 235881
O2 CONTENT: 2 L/min
O2 SAT: 98.6 %
PH ART: 7.427 (ref 7.350–7.450)
Patient temperature: 98.6
TCO2: 25.9 mmol/L (ref 0–100)
pCO2 arterial: 38.2 mmHg (ref 35.0–45.0)
pO2, Arterial: 112 mmHg — ABNORMAL HIGH (ref 80.0–100.0)

## 2016-02-01 LAB — RENAL FUNCTION PANEL
ALBUMIN: 2.5 g/dL — AB (ref 3.5–5.0)
ALBUMIN: 2.5 g/dL — AB (ref 3.5–5.0)
Anion gap: 10 (ref 5–15)
Anion gap: 12 (ref 5–15)
BUN: 29 mg/dL — AB (ref 6–20)
BUN: 32 mg/dL — AB (ref 6–20)
CALCIUM: 8.2 mg/dL — AB (ref 8.9–10.3)
CO2: 25 mmol/L (ref 22–32)
CO2: 23 mmol/L (ref 22–32)
CREATININE: 7.94 mg/dL — AB (ref 0.44–1.00)
Calcium: 8.1 mg/dL — ABNORMAL LOW (ref 8.9–10.3)
Chloride: 103 mmol/L (ref 101–111)
Chloride: 102 mmol/L (ref 101–111)
Creatinine, Ser: 7.17 mg/dL — ABNORMAL HIGH (ref 0.44–1.00)
GFR calc Af Amer: 7 mL/min — ABNORMAL LOW (ref >60)
GFR calc non Af Amer: 6 mL/min — ABNORMAL LOW (ref >60)
GFR, EST AFRICAN AMERICAN: 6 mL/min — AB (ref >60)
GFR, EST NON AFRICAN AMERICAN: 5 mL/min — AB (ref >60)
GLUCOSE: 54 mg/dL — AB (ref 65–99)
Glucose, Bld: 76 mg/dL (ref 65–99)
PHOSPHORUS: 4.3 mg/dL (ref 2.5–4.6)
POTASSIUM: 4.4 mmol/L (ref 3.5–5.1)
Phosphorus: 4.7 mg/dL — ABNORMAL HIGH (ref 2.5–4.6)
Potassium: 4.4 mmol/L (ref 3.5–5.1)
SODIUM: 137 mmol/L (ref 135–145)
Sodium: 138 mmol/L (ref 135–145)

## 2016-02-01 LAB — COMPREHENSIVE METABOLIC PANEL
ALBUMIN: 2.7 g/dL — AB (ref 3.5–5.0)
ALK PHOS: 63 U/L (ref 38–126)
ALT: 10 U/L — ABNORMAL LOW (ref 14–54)
ANION GAP: 13 (ref 5–15)
AST: 14 U/L — ABNORMAL LOW (ref 15–41)
BILIRUBIN TOTAL: 0.6 mg/dL (ref 0.3–1.2)
BUN: 30 mg/dL — ABNORMAL HIGH (ref 6–20)
CALCIUM: 8.1 mg/dL — AB (ref 8.9–10.3)
CO2: 23 mmol/L (ref 22–32)
Chloride: 102 mmol/L (ref 101–111)
Creatinine, Ser: 7.58 mg/dL — ABNORMAL HIGH (ref 0.44–1.00)
GFR, EST AFRICAN AMERICAN: 7 mL/min — AB (ref >60)
GFR, EST NON AFRICAN AMERICAN: 6 mL/min — AB (ref >60)
GLUCOSE: 119 mg/dL — AB (ref 65–99)
POTASSIUM: 4.4 mmol/L (ref 3.5–5.1)
Sodium: 138 mmol/L (ref 135–145)
TOTAL PROTEIN: 5.7 g/dL — AB (ref 6.5–8.1)

## 2016-02-01 LAB — TROPONIN I
TROPONIN I: 1.82 ng/mL — AB (ref <0.031)
Troponin I: 2.1 ng/mL (ref <0.031)
Troponin I: 2.44 ng/mL (ref <0.031)

## 2016-02-01 LAB — GLUCOSE, CAPILLARY
GLUCOSE-CAPILLARY: 147 mg/dL — AB (ref 65–99)
GLUCOSE-CAPILLARY: 162 mg/dL — AB (ref 65–99)
GLUCOSE-CAPILLARY: 46 mg/dL — AB (ref 65–99)
GLUCOSE-CAPILLARY: 76 mg/dL (ref 65–99)
Glucose-Capillary: 46 mg/dL — ABNORMAL LOW (ref 65–99)
Glucose-Capillary: 122 mg/dL — ABNORMAL HIGH (ref 65–99)
Glucose-Capillary: 151 mg/dL — ABNORMAL HIGH (ref 65–99)
Glucose-Capillary: 92 mg/dL (ref 65–99)
Glucose-Capillary: 199 mg/dL — ABNORMAL HIGH (ref 65–99)
Glucose-Capillary: 171 mg/dL — ABNORMAL HIGH (ref 65–99)

## 2016-02-01 LAB — URINE MICROSCOPIC-ADD ON
Bacteria, UA: NONE SEEN
RBC / HPF: NONE SEEN RBC/hpf (ref 0–5)

## 2016-02-01 LAB — MRSA PCR SCREENING: MRSA BY PCR: NEGATIVE

## 2016-02-01 MED ORDER — DEXTROSE 50 % IV SOLN
1.0000 | Freq: Once | INTRAVENOUS | Status: AC
Start: 2016-02-01 — End: 2016-02-01
  Administered 2016-02-01: 50 mL via INTRAVENOUS

## 2016-02-01 MED ORDER — NALOXONE HCL 0.4 MG/ML IJ SOLN
INTRAMUSCULAR | Status: AC
  Administered 2016-02-01: 0.4 mg
  Filled 2016-02-01: qty 1

## 2016-02-01 MED ORDER — LIDOCAINE HCL (PF) 1 % IJ SOLN: 5.0000 mL | INTRAMUSCULAR | Status: DC | PRN

## 2016-02-01 MED ORDER — LIDOCAINE-PRILOCAINE 2.5-2.5 % EX CREA: 1.0000 "application " | TOPICAL_CREAM | CUTANEOUS | Status: DC | PRN

## 2016-02-01 MED ORDER — DEXTROSE 50 % IV SOLN
INTRAVENOUS | Status: AC
  Filled 2016-02-01: qty 50

## 2016-02-01 MED ORDER — ALTEPLASE 2 MG IJ SOLR: 2.0000 mg | Freq: Once | INTRAMUSCULAR | Status: DC | PRN

## 2016-02-01 MED ORDER — DEXTROSE 50 % IV SOLN
INTRAVENOUS | Status: AC
  Administered 2016-02-01: 50 mL
  Filled 2016-02-01: qty 50

## 2016-02-01 MED ORDER — PENTAFLUOROPROP-TETRAFLUOROETH EX AERO: 1.0000 "application " | INHALATION_SPRAY | CUTANEOUS | Status: DC | PRN

## 2016-02-01 MED ORDER — NALOXONE HCL 0.4 MG/ML IJ SOLN: 0.4000 mg | INTRAMUSCULAR | Status: DC | PRN

## 2016-02-01 MED ORDER — SODIUM CHLORIDE 0.9 % IV SOLN
100.0000 mL | INTRAVENOUS | Status: DC | PRN
Start: 2016-02-01 — End: 2016-02-02

## 2016-02-01 MED ORDER — SODIUM CHLORIDE 0.9 % IV SOLN: 100.0000 mL | INTRAVENOUS | Status: DC | PRN

## 2016-02-01 MED ORDER — HEPARIN SODIUM (PORCINE) 1000 UNIT/ML DIALYSIS
40.0000 [IU]/kg | INTRAMUSCULAR | Status: DC | PRN
  Filled 2016-02-01: qty 3

## 2016-02-01 MED ORDER — HEPARIN SODIUM (PORCINE) 1000 UNIT/ML DIALYSIS
1000.0000 [IU] | INTRAMUSCULAR | Status: DC | PRN
  Filled 2016-02-01: qty 1

## 2016-02-01 MED ORDER — IOPAMIDOL (ISOVUE-300) INJECTION 61%
INTRAVENOUS | Status: AC
  Administered 2016-02-01: 100 mL
  Filled 2016-02-01: qty 100

## 2016-02-01 MED ORDER — DEXTROSE-NACL 5-0.45 % IV SOLN
INTRAVENOUS | Status: DC
  Administered 2016-02-01 – 2016-02-02 (×2): via INTRAVENOUS

## 2016-02-01 NOTE — Progress Notes (Signed)
Hypoglycemic Event  CBG: 46  Treatment: D50 IV 50 mL  Symptoms: None  Follow-up CBG: [id number] CBG Result:151  Possible Reasons for Event: Unknown Comments/MD notified:Will Latexo PA   [place], [place]

## 2016-02-01 NOTE — Progress Notes (Signed)
Called by RN for assistance.  Md at bedside.  Patient has been spiking temps and had an episode of hypoglycemia which was treated this am.  As per Rn patient was unresponsive, prior to my arrival patient was given narcan x 2 doses with minimal relief.  Patient moving all extremities, but not following commands but moaning.  ABD very tender to soft touch.  VSS, Hr 67, 121/50. Placed on tele monitor. RT called for ABG.  Patient to transfer.  RN to call if assistance needed

## 2016-02-01 NOTE — Progress Notes (Signed)
Patient with initial troponin of 1.8, she was too lethargic to say whether she has any chest pain or not, she has ESRD disease. Potentially due to postop cardiac event versus demand ischemia. Repeat troponin several hours later showed continued uptrend of 2.1. I consulted cardiology, appreciate input, discussed with Dr. Hetty Blend. Cruzita Lederer, MD [facility] [phone]

## 2016-02-01 NOTE — Progress Notes (Signed)
Patient transferred to 2C13 from 6N. Patient denies any pain or discomfort at this time. CHG bath completed. Family at bedside.

## 2016-02-01 NOTE — Consult Note (Signed)
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 Acute encephalopathy. Etiology uncertain may be multifactorial but certainly a concern for sepsis in the setting of missed dialysis. Moving all extremities spontaneously to painful stimuli along with verbal response so doubt neurological. Fever, hypoglycemia, no sedating medications since yesterday afternoon. Narcan 2 with little improvement. Also provided with 1 amp of D50 -Obtain chest x-ray urinalysis acute abdomen x-ray -cmet, troponin, CBC, ABG, lactic acid -Continue cefepime and Flagyl for now -Hold any sedating medications -Hold glipizide and Lantus -Monitor cbg 4 hours -Transfer to stepdown  #2. End-stage renal disease. Patient is a Monday Wednesday Friday dialysis patient. She missed dialysis yesterday. Creatinine currently 7.17. Up from 4.7 no one day ago. Chart review indicates baseline between 3 and 4.5 -Nephrology consult  #3.SIRS. See number 1. Concern for abdominal source given recent surgery and abdominal exam -CT abdomen  4. Hypertension. Pressure currently controlled. Home medications include Coreg, Bumex, Apresoline, losartan -Continue these for now -Monitor blood pressure closely  #5. Diabetes. Home medications include Lantus and glipizide. She had a hypoglycemic event early this morning. Improved after D50. -Hold Lantus and glipizide -Monitor CBG every 4 hours -obtain  HgA1c  #6. CAD. No report of complaints of chest pain. Status post CABG 2015. -cycle troponin -obtain EKG -monitor on tele   TRH followup again tomorrow. Please contact if can be of assistance in the meanwhile. Thank you for this consultation.  Chief Complaint: acute encephalopathy,hypoglycemia, feve  HPI: Patient is a 45 year old woman with past medical history of end-stage renal disease Monday Wednesday Friday dialysis patient, CAD, hypertension, diabetes admitted 2 days ago with acute appendicitis status post laparoscopic appendectomy/lysis of adhesions 2 days ago. According to Gen. surgery patient did well and plan yesterday was for patient to be discharged after her dialysis session. He did not complete dialysis yesterday it is unclear why. Last night she spiked a temperature of 102.5 and this morning she had an episode of hypoglycemia and has developed acute encephalopathy. TRH asked to consult   Review of Systems:  Unable to review systems due to acute illness  Past Medical History  Diagnosis Date  . Dialysis patient ([place])   . Diabetes mellitus without complication ([place])   . Coronary artery disease    Past Surgical History  Procedure Laterality Date  . Cesarean section    . Laparoscopic bilateral salpingo oopherectomy    . Cardiac catheterization  02/21/14    done at [facility] History:  reports that she has never smoked. She does not have any smokeless tobacco history on file. She reports that she does not drink alcohol or use illicit drugs.  Allergies  Allergen Reactions  . Norvasc [Amlodipine] Swelling   Family History  Problem Relation Age of Onset  . Heart attack Mother   . Heart attack Father   . Heart attack Maternal Grandmother   . Heart attack Maternal Grandfather   . Hypertension Father     Prior to Admission medications   Medication Sig Start Date  End Date Taking? Authorizing Provider  acetaminophen (TYLENOL) 325 MG tablet Take 325 mg by mouth 2  (two) times daily as needed (pain).   Yes Historical Provider, MD  aspirin 325 MG tablet Take 325 mg by mouth daily.   Yes Historical Provider, MD  bumetanide (BUMEX) 2 MG tablet Take 2 mg by mouth daily.   Yes Historical Provider, MD  carvedilol (COREG) 25 MG tablet Take 25 mg by mouth 2 (two) times daily.    Yes Historical Provider, MD  Cholecalciferol (VITAMIN D3) 1000 UNITS CAPS Take by mouth every morning.   Yes Historical Provider, MD  diltiazem (DILACOR XR) 240 MG 24 hr capsule Take 240 mg by mouth 2 (two) times daily.   Yes Historical Provider, MD  DOCOSAHEXAENOIC ACID PO Take 1 capsule by mouth every morning.   Yes Historical Provider, MD  docusate sodium (COLACE) 100 MG capsule Take 100 mg by mouth 2 (two) times daily.   Yes Historical Provider, MD  docusate sodium (COLACE) 100 MG capsule Take 100 mg by mouth 2 (two) times daily as needed for mild constipation.    Yes Historical Provider, MD  epoetin alfa (EPOGEN,PROCRIT) 79909 UNIT/ML injection Inject 10,000 Units into the skin every Monday, Wednesday, and Friday.    Yes Historical Provider, MD  Ferric Citrate (AURYXIA) 1 GM 210 MG(Fe) TABS Take 210 mg by mouth 3 (three) times daily with meals.    Yes Historical Provider, MD  glipiZIDE (GLUCOTROL) 10 MG tablet Take 10 mg by mouth daily before breakfast.    Yes Historical Provider, MD  hydrALAZINE (APRESOLINE) 100 MG tablet Take 100 mg by mouth 3 (three) times daily.   Yes Historical Provider, MD  insulin glargine (LANTUS) 100 UNIT/ML injection Inject 15 Units into the skin every morning. > 140   Yes Historical Provider, MD  lidocaine-prilocaine (EMLA) cream Apply 1 application topically every Monday, Wednesday, and Friday. 01/01/16  Yes Historical Provider, MD  losartan (COZAAR) 50 MG tablet Take 50 mg by mouth daily. 01/19/16 02/18/16 Yes Historical Provider, MD  Omega-3 Krill Oil 500 MG CAPS Take 500 mg by mouth daily.    Yes Historical Provider, MD  pravastatin (PRAVACHOL) 40 MG tablet  Take 40 mg by mouth at bedtime.   Yes Historical Provider, MD  sevelamer carbonate (RENVELA) 800 MG tablet Take 2,400 mg by mouth 3 (three) times daily with meals.   Yes Historical Provider, MD  sodium bicarbonate 650 MG tablet Take 650 mg by mouth at bedtime.   Yes Historical Provider, MD  oxyCODONE (OXY IR/ROXICODONE) 5 MG immediate release tablet Take 1-2 tablets (5-10 mg total) by mouth every 4 (four) hours as needed for moderate pain. 01/31/16   Glenna Fellows, MD   Physical Exam: Blood pressure 121/50, pulse 62, temperature 100.3 F (37.9 C), temperature source Oral, resp. rate 18, height 5\' 1"  (1.549 m), weight 73.029 kg (161 lb), SpO2 95 %. Filed Vitals:   02/01/16 0649 02/01/16 0832  BP: 128/49 121/50  Pulse: 66 62  Temp: 100.3 F (37.9 C) 100.3 F (37.9 C)  Resp: 18      General:  obtunded obese response to painful stimuli only   Eyes: Pupils round reactive to light mild scleral icterus   ENT: Ears clear nose without drainage oropharynx without erythema or exudate mucous membranes of her mouth are slightly pale somewhat dry   Neck: No JVD full range of motion   Cardiovascular: Rate and rhythm + murmur no gallop no rub no lower extremity edema  Respiratory: Mild increased work of breathing some use of abdominal accessory muscles breath sounds somewhat diminished and coarse scattered rhonchi   Abdomen: Somewhat distended quite tender to palpation throughout left lower quadrant incision clean and dry base sluggish bowel sounds   Skin: Cool and dry no rash   Musculoskeletal: Joints without swelling/erythema moves all extremities to painful stimuli   Psychiatric: Obtunded   Neurologic: Quite obtunded moves all extremities and screams to moderate painful stimuli. Does not follow commands   Labs on Admission:  Basic Metabolic Panel:  Recent Labs Lab 01/30/16 1658 01/31/16 0631 01/31/16 1000 02/01/16 0501  NA 138  --  141 138  K 3.3*  --  4.5 4.4  CL 102  --   106 103  CO2 31  --  19* 25  GLUCOSE 164*  --  124* 54*  BUN 10  --  13 29*  CREATININE 3.39* 4.70* 4.70* 7.17*  CALCIUM 8.3*  --  8.1* 8.1*  PHOS  --   --  3.5 4.3   Liver Function Tests:  Recent Labs Lab 01/30/16 1658 01/31/16 1000 02/01/16 0501  AST 17  --   --   ALT 17  --   --   ALKPHOS 92  --   --   BILITOT 0.4  --   --   PROT 7.3  --   --   ALBUMIN 3.6 3.0* 2.5*    Recent Labs Lab 01/30/16 1658  LIPASE 59*   No results for input(s): AMMONIA in the last 168 hours. CBC:  Recent Labs Lab 01/30/16 1658 01/31/16 0750  WBC 13.4* 13.2*  NEUTROABS 10.8*  --   HGB 9.4* 8.1*  HCT 28.7* 26.4*  MCV 98.6 101.5*  PLT 259 228   Cardiac Enzymes: No results for input(s): CKTOTAL, CKMB, CKMBINDEX, TROPONINI in the last 168 hours. BNP: Invalid input(s): POCBNP CBG:  Recent Labs Lab 01/31/16 0110 01/31/16 2039 02/01/16 0711 02/01/16 0745 02/01/16 0826  GLUCAP 102* 96 46* 151* 122*    Radiological Exams on Admission: Ct Abdomen Pelvis Wo Contrast  01/30/2016  CLINICAL DATA:  Acute onset of left lower quadrant and right lower quadrant abdominal pain. Initial encounter. EXAM: CT ABDOMEN AND PELVIS WITHOUT CONTRAST TECHNIQUE: Multidetector CT imaging of the abdomen and pelvis was performed following the standard protocol without IV contrast. COMPARISON:  None. FINDINGS: The visualized lung bases are clear. The patient is status post median sternotomy. Scattered coronary artery calcifications are seen. The liver and spleen are unremarkable in appearance. The gallbladder is within normal limits. The pancreas and adrenal glands are unremarkable. Nonspecific perinephric stranding is noted bilaterally. Scattered bilateral calcifications are noted along the renal arteries and their branches. There is no evidence of hydronephrosis. No renal or ureteral stones are identified. A 2.1 cm cyst is noted at the posterior aspect of the left kidney. The small bowel is unremarkable in  appearance. The stomach is within normal limits. No acute vascular abnormalities are seen. Scattered calcification is seen along the abdominal aorta and its branches, including along the superior mesenteric artery and inferior mesenteric artery. The appendix is dilated 1.2 cm in maximal diameter, with suggestion of a 1.2 cm appendicolith near the base of the appendix, and mild surrounding soft tissue inflammation. This raises concern for mild acute appendicitis. There is no evidence of perforation or abscess formation at this time. Trace underlying fluid is seen. The colon is largely decompressed and is grossly unremarkable in appearance. The bladder is mildly distended and grossly  unremarkable. The uterus is unremarkable in appearance. The ovaries are relatively symmetric. No suspicious adnexal masses are seen. No inguinal lymphadenopathy is seen. No acute osseous abnormalities are identified. IMPRESSION: 1. Mild acute appendicitis, with dilatation of the appendix to 1.2 cm in maximal diameter, and suggestion of 1.2 cm appendicolith near the base of the appendix. Mild surrounding soft tissue inflammation noted. No evidence of perforation or abscess formation at this time. 2. Scattered calcification along the abdominal aorta and its branches, including along the superior mesenteric artery and inferior mesenteric artery. Scattered calcifications along the renal arteries and their branches. 3. Scattered coronary artery calcifications seen. 4. Small left renal cyst noted. These results were called by telephone at the time of interpretation on 01/30/2016 at 8:40 pm to Dr. Rockne Menghini, who verbally acknowledged these results. Electronically Signed   By: Garald Balding M.D.   On: 01/30/2016 20:41    EKG: pending   Time spent: 75 minutes  [facility]   If 7PM-7AM, please contact night-coverage [email or web address] Password [email or web address] 02/01/2016, 9:05 AM

## 2016-02-01 NOTE — Consult Note (Addendum)
Cardiology Consult Note  Admit date: 01/30/2016 Name: Isabel Sharp 45 y.o.  female DOB:  Sep 10, 1971 MRN:  [id number]  Today's date:  02/01/2016  Referring Physician:   [facility]  Reason for Consultation:    Abnormal troponin  IMPRESSIONS: 1.  Elevation of troponin following recent emergency appendectomy and also an episode of somnolence. This most likely represents demand ischemia In the setting of fever and sepsis 2.  Fever and sepsis following emergency appendectomy 3.  History of coronary artery disease with coronary bypass grafting in 2015 at [facility] for three-vessel coronary artery disease but with unbypassed right coronary artery 4.  Diabetes mellitus with renal disease 5.  Hypertension 6.  Hyperlipidemia 7.  Somnolence due to hypoglycemia currently 8. Carotid artery disease  RECOMMENDATION: 1.  Continue to cycle troponins They remain low level I would just treat her underlying illness and probably not pursue much of an ischemic workup at this time  2.  Obtain EKG. Originally one was not done in Epic but eventually the nurse was able to find one hidden in a paper chart.  3.  Obtain echocardiogram  HISTORY: This 45 year old [place] female is seen at the request [facility] for an elevation of troponin.  She has a complicated history.  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 She went on dialysis In 2014 and then had a transplant evaluation where she was found to have three-vessel coronary artery disease after an abnormal stress test and underwent bypass grafting in May 2015 with a mammary graft to the LAD, vein graft to the diagonal and vein graft to marginal branch.  She did well and had improvement in ejection fraction since then.  She had carotid artery disease noted on screening and was found to have a severe left carotid stenosis and underwent left carotid  endarterectomy in July 2016.  She has been on home hemodialysis.  She more recently was seen at the transplant surgery and the [facility].  She has had some fistula revisions since then.  She developed acute appendicitis and was admitted 2 days ago and underwent appendectomy.  She was supposed to go home after dialysis but developed fever and somnolence and the hospitalist ordered a troponin which returned abnormal. An EKG was not available in Epic but found one in the paper chart that showed some ST depression laterally and left axis deviation.  Currently the patient is somnolent and I can barely get a history. When I can get her to wake up she does note a cardiac history that we obtained through extensive review of the old records that were available in [facility].Really can't obtain much in the way of other history.  Past Medical History  Diagnosis Date  . End-stage renal disease on hemodialysis ([place])   . Diabetes mellitus with end stage renal disease ([place])   . Coronary artery disease   . Carotid artery disease [facility])      Past Surgical History  Procedure Laterality Date  . Cesarean section    . Laparoscopic bilateral salpingo oopherectomy    . Cardiac catheterization  02/21/14    done at [facility]-  . Coronary artery bypass graft  02/24/14    LIMA to LAD, SVG  . Carotid endarterectomy  Left    Allergies:  is allergic to norvasc.   Medications: Prior to Admission medications   Medication Sig Start Date End Date Taking? Authorizing Provider  acetaminophen (TYLENOL)  325 MG tablet Take 325 mg by mouth 2 (two) times daily as needed (pain).   Yes Historical Provider, MD  aspirin 325 MG tablet Take 325 mg by mouth daily.   Yes Historical Provider, MD  bumetanide (BUMEX) 2 MG tablet Take 2 mg by mouth daily.   Yes Historical Provider, MD  carvedilol (COREG) 25 MG tablet Take 25 mg by mouth 2 (two) times daily.    Yes Historical Provider, MD  Cholecalciferol (VITAMIN D3) 1000 UNITS CAPS  Take by mouth every morning.   Yes Historical Provider, MD  diltiazem (DILACOR XR) 240 MG 24 hr capsule Take 240 mg by mouth 2 (two) times daily.   Yes Historical Provider, MD  DOCOSAHEXAENOIC ACID PO Take 1 capsule by mouth every morning.   Yes Historical Provider, MD  docusate sodium (COLACE) 100 MG capsule Take 100 mg by mouth 2 (two) times daily.   Yes Historical Provider, MD  docusate sodium (COLACE) 100 MG capsule Take 100 mg by mouth 2 (two) times daily as needed for mild constipation.    Yes Historical Provider, MD  epoetin alfa (EPOGEN,PROCRIT) 95638 UNIT/ML injection Inject 10,000 Units into the skin every Monday, Wednesday, and Friday.    Yes Historical Provider, MD  Ferric Citrate (AURYXIA) 1 GM 210 MG(Fe) TABS Take 210 mg by mouth 3 (three) times daily with meals.    Yes Historical Provider, MD  glipiZIDE (GLUCOTROL) 10 MG tablet Take 10 mg by mouth daily before breakfast.    Yes Historical Provider, MD  hydrALAZINE (APRESOLINE) 100 MG tablet Take 100 mg by mouth 3 (three) times daily.   Yes Historical Provider, MD  insulin glargine (LANTUS) 100 UNIT/ML injection Inject 15 Units into the skin every morning. > 140   Yes Historical Provider, MD  lidocaine-prilocaine (EMLA) cream Apply 1 application topically every Monday, Wednesday, and Friday. 01/01/16  Yes Historical Provider, MD  losartan (COZAAR) 50 MG tablet Take 50 mg by mouth daily. 01/19/16 02/18/16 Yes Historical Provider, MD  Omega-3 Krill Oil 500 MG CAPS Take 500 mg by mouth daily.    Yes Historical Provider, MD  pravastatin (PRAVACHOL) 40 MG tablet Take 40 mg by mouth at bedtime.   Yes Historical Provider, MD  sevelamer carbonate (RENVELA) 800 MG tablet Take 2,400 mg by mouth 3 (three) times daily with meals.   Yes Historical Provider, MD  sodium bicarbonate 650 MG tablet Take 650 mg by mouth at bedtime.   Yes Historical Provider, MD  oxyCODONE (OXY IR/ROXICODONE) 5 MG immediate release tablet Take 1-2 tablets (5-10 mg total) by  mouth every 4 (four) hours as needed for moderate pain. 01/31/16   Excell Seltzer, MD   Family History: Unable to obtain due to lethargy  Social History:  chart reports that she is a nonsmoker, not able to obtain due to lethargy   Review of Systems: Not obtainable due to lethargy  Physical Exam: BP 157/64 mmHg  Pulse 66  Temp(Src) 100.7 F (38.2 C) (Axillary)  Resp 7  Ht $R'5\' 1"'dB$  (1.549 m)  Wt 73.029 kg (161 lb)  BMI 30.44 kg/m2  SpO2 99%  General appearance: she is a lethargic black female currently in no acute distress Head: Normocephalic, without obvious abnormality, atraumatic Eyes: conjunctivae/corneas clear. PERRL, EOM's intact. Fundi Not examined Neck: no adenopathy,Soft bilateral carotid bruit, no JVD and supple, symmetrical, trachea midline Lungs: clear to auscultation bilaterally Heart: normal S1 and S2, no S3, there is a murmur that may be transmitted from the previous fistula heard  over the upper sternal area which is holosystolic Abdomen: recent surgery, mildly tender Pelvic: deferred Extremities: extremities normal, atraumatic, no cyanosis or edema Pulses: 2+ and symmetric Neurologic: Grossly normal  Labs: CBC  Recent Labs  01/30/16 1658  02/01/16 1656  WBC 13.4*  < > 11.4*  RBC 2.91*  < > 2.32*  HGB 9.4*  < > 7.4*  HCT 28.7*  < > 23.7*  PLT 259  < > 190  MCV 98.6  < > 102.2*  MCH 32.3  < > 31.9  MCHC 32.8  < > 31.2  RDW 14.1  < > 14.7  LYMPHSABS 1.2  --   --   MONOABS 0.7  --   --   EOSABS 0.7  --   --   BASOSABS 0.0  --   --   < > = values in this interval not displayed. CMP   Recent Labs  02/01/16 0904 02/01/16 1656  NA 138 137  K 4.4 4.4  CL 102 102  CO2 23 23  GLUCOSE 119* 76  BUN 30* 32*  CREATININE 7.58* 7.94*  CALCIUM 8.1* 8.2*  PROT 5.7*  --   ALBUMIN 2.7* 2.5*  AST 14*  --   ALT 10*  --   ALKPHOS 63  --   BILITOT 0.6  --   GFRNONAA 6* 5*  GFRAA 7* 6*   Cardiac Panel (last 3 results)  Recent Labs  02/01/16 0904  02/01/16 1133  TROPONINI 1.82* 2.10*     Radiology: Cardiomegaly, previous bypass surgery, right chest dialysis catheter  EKG: Voltage for LVH, sinus rhythm, lateral ST depression, left axis deviation  Signed:  W. Doristine Church MD [facility]   Cardiology Consultant  02/01/2016, 8:17 PM

## 2016-02-01 NOTE — Progress Notes (Signed)
Report called to 2c13.

## 2016-02-01 NOTE — Progress Notes (Signed)
Patient continues to be lethargic but arousable.  -Earlier thought to be related to hypoglycemia -CT head shows no focal abnormalities -CT abdomen with expected findings -WBC stable to slightly downtrending -glc improved to 199, still not completely normal mental status -will continue with d5 IV fluids and close glucose monitoring overnight  Gurney Maxin, M.D. [facility] Pg: [phone]

## 2016-02-01 NOTE — Progress Notes (Signed)
Paged Dr. Ninfa Linden to notify him that patient's temp is 101.3. Lung sounds clear. Patient pulls 1000 to 1250 on IS.  Medicated with $RemoveBefore'325mg'[id number]$  Tylenol at 2047. Tylenol order is for 325 mg twice daily. Patient in no acute distress.

## 2016-02-01 NOTE — Progress Notes (Signed)
2 Days Post-Op  Subjective: Fever lat PM and Hypoglycemia last night and this AM.  On coming into room she is sedated appearing, will not really wake up.  When agitated she will open her eyes, moving all extremities, but not to command.  Will says something occasionally but will not even give [place] her name.   Glucose 122, BP 121/50, 97% sat on R/A, HR 63, Temp 100.3.  She was given 2 doses of Narcan 0.4 mg IV without any real change in her mentation.  Medicine was called to see.  She has a slight intermittent tremor of both arms noted after narcan. First dose of narcan at 8:30 AM  She moves all extremities well with stimulation, but not on command.   Objective: Vital signs in last 24 hours: Temp:  [98 F (36.7 C)-102.5 F (39.2 C)] 100.3 F (37.9 C) (04/30 0649) Pulse Rate:  [65-73] 66 (04/30 0649) Resp:  [16-18] 18 (04/30 0649) BP: (128-155)/(48-64) 128/49 mmHg (04/30 0649) SpO2:  [92 %-100 %] 95 % (04/30 0649) Last BM Date: 01/30/16 PO 720 recorded, no IV fluids due to ESRD Fever all PM, down to 100.6 this AM 0600 No labs currently No films  Intake/Output from previous day: 04/29 0701 - 04/30 0700 In: 720 [P.O.:720] Out: -  Intake/Output this shift:    General appearance: pt ill not wake up, she will look at you with stimulation, but not respond Resp: course breath sounds, but moving air well, sats 97% RA, air way is stable and safe. Cardio: regular rate and rhythm, + mumur.   GI: port sites look fine.  soft, but now a little tighter after narcan.  BS diminished Extremities: extremities normal, atraumatic, no cyanosis or edema Neurologic:  Pt appears sedated, will open her eyes, to stimulation and then close them again. Moving all extremities with stimulation, but will not follow any command .     Lab Results:   Recent Labs  01/30/16 1658 01/31/16 0750  WBC 13.4* 13.2*  HGB 9.4* 8.1*  HCT 28.7* 26.4*  PLT 259 228    BMET  Recent Labs  01/31/16 1000 02/01/16 0501   NA 141 138  K 4.5 4.4  CL 106 103  CO2 19* 25  GLUCOSE 124* 54*  BUN 13 29*  CREATININE 4.70* 7.17*  CALCIUM 8.1* 8.1*   PT/INR No results for input(s): LABPROT, INR in the last 72 hours.   Recent Labs Lab 01/30/16 1658 01/31/16 1000 02/01/16 0501  AST 17  --   --   ALT 17  --   --   ALKPHOS 92  --   --   BILITOT 0.4  --   --   PROT 7.3  --   --   ALBUMIN 3.6 3.0* 2.5*     Lipase     Component Value Date/Time   LIPASE 59* 01/30/2016 1658     Studies/Results: Ct Abdomen Pelvis Wo Contrast  01/30/2016  CLINICAL DATA:  Acute onset of left lower quadrant and right lower quadrant abdominal pain. Initial encounter. EXAM: CT ABDOMEN AND PELVIS WITHOUT CONTRAST TECHNIQUE: Multidetector CT imaging of the abdomen and pelvis was performed following the standard protocol without IV contrast. COMPARISON:  None. FINDINGS: The visualized lung bases are clear. The patient is status post median sternotomy. Scattered coronary artery calcifications are seen. The liver and spleen are unremarkable in appearance. The gallbladder is within normal limits. The pancreas and adrenal glands are unremarkable. Nonspecific perinephric stranding is noted bilaterally. Scattered bilateral  calcifications are noted along the renal arteries and their branches. There is no evidence of hydronephrosis. No renal or ureteral stones are identified. A 2.1 cm cyst is noted at the posterior aspect of the left kidney. The small bowel is unremarkable in appearance. The stomach is within normal limits. No acute vascular abnormalities are seen. Scattered calcification is seen along the abdominal aorta and its branches, including along the superior mesenteric artery and inferior mesenteric artery. The appendix is dilated 1.2 cm in maximal diameter, with suggestion of a 1.2 cm appendicolith near the base of the appendix, and mild surrounding soft tissue inflammation. This raises concern for mild acute appendicitis. There is no  evidence of perforation or abscess formation at this time. Trace underlying fluid is seen. The colon is largely decompressed and is grossly unremarkable in appearance. The bladder is mildly distended and grossly unremarkable. The uterus is unremarkable in appearance. The ovaries are relatively symmetric. No suspicious adnexal masses are seen. No inguinal lymphadenopathy is seen. No acute osseous abnormalities are identified. IMPRESSION: 1. Mild acute appendicitis, with dilatation of the appendix to 1.2 cm in maximal diameter, and suggestion of 1.2 cm appendicolith near the base of the appendix. Mild surrounding soft tissue inflammation noted. No evidence of perforation or abscess formation at this time. 2. Scattered calcification along the abdominal aorta and its branches, including along the superior mesenteric artery and inferior mesenteric artery. Scattered calcifications along the renal arteries and their branches. 3. Scattered coronary artery calcifications seen. 4. Small left renal cyst noted. These results were called by telephone at the time of interpretation on 01/30/2016 at 8:40 pm to Dr. Rockne Menghini, who verbally acknowledged these results. Electronically Signed   By: Garald Balding M.D.   On: 01/30/2016 20:41    Medications: . bumetanide  2 mg Oral Daily  . carvedilol  25 mg Oral BID WC  . ceFEPime (MAXIPIME) IV  2 g Intravenous Q M,W,F-HD   And  . metronidazole  500 mg Intravenous Q8H  . cefTRIAXone (ROCEPHIN)  IV  1 g Intravenous Once  . diltiazem  240 mg Oral BID  . docusate sodium  100 mg Oral BID  . Ferric Citrate  210 mg Oral TID WC  . glipiZIDE  10 mg Oral QAC breakfast  . heparin subcutaneous  5,000 Units Subcutaneous Q8H  . hydrALAZINE  100 mg Oral Q8H  . insulin glargine  15 Units Subcutaneous Daily  . losartan  50 mg Oral Daily  . metronidazole  500 mg Intravenous Once  . omega-3 acid ethyl esters  1,000 mg Oral Daily  . pravastatin  40 mg Oral QHS  . sevelamer carbonate   2,400 mg Oral TID WC  . sodium bicarbonate  650 mg Oral QHS     Assessment/Plan Acute appendicitis  S/p Laparoscopic appendectomy/lysis of adhesions, 01/31/16, Dr. Lurena Joiner Kinsinger Possible sepsis Encephalopathy  ESRD on HD AODM with hypoglycemia Hypertension CAD/MI 2015/cardiomyopathy EF 35-40% on Echo 02/2014 Hypoparathyroidism FEN: Renal diet 1200 ml restriction ID:  Maxipime x 1 with HD/Flagyl q8h VTE:  Heparin/SCD   Plan:  I have ask Medicine to see and evaluate.  We have ordered CBC, troponin, BMP, EKG, CXR, and abdominal film.  Discussed with Medicine and plan transfer SDU.   Singh,Arvinder Spouse [phone]  [phone]    Multiple attempts to call family and sent to automated system.   9:25 AM, unable to contact husband, Rapid response nurse with her now.  She will squeeze both hand on command now.  10:20 AM, pt awake now, moving all extremities on command, she could not remember [facility], but does remember [facility], when ask where she is.  She is answering question, speech is intact, and she says only pain is her LUQ.  ABG's OK, CBC is stable, troponin 1.82.  Plain film of abdomen:  No evidence of obstruction. No dilated loops of bowel. Calcifications splenic artery noted.  CXR read not completed, I don't see anything acute, really large heart.     Earnstine Regal 02/01/2016 [id number]

## 2016-02-01 NOTE — Progress Notes (Signed)
Patient ID: Isabel Sharp, female   DOB: 03-26-1971, 45 y.o.   MRN: [id number]  [facility] Progress Note   Assessment/ Plan:   1. Acute appendicitis status post laparoscopic appendectomy performed early yesterday morning. Concerned raised with fever/hypoglycemia overnight and decreased responsiveness this morning. She is on broad-spectrum antibiotic therapy coverage with cefepime and metronidazole 2.ESRD: Usually MWF@[facility] (Dr. Neta Ehlers) and underwent her last scheduled hemodialysis on Friday (4/28). Plan for next hemodialysis again tomorrow. Will use her right IJ TDC (awaiting evaluation of left BCF with recent infiltration). 3. 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 5. Nutrition: Resume nutritional intake per surgery recommendations. 6. Hypertension: Blood pressures acceptable at this point, continue to monitor on current therapy. She is euvolemic on physical examination.  Subjective:   Depressed mental status earlier this morning-seemingly improving    Objective:   BP 136/49 mmHg  Pulse 61  Temp(Src) 100.3 F (37.9 C) (Oral)  Resp 18  Ht 5\' 1"  (1.549 m)  Wt 73.029 kg (161 lb)  BMI 30.44 kg/m2  SpO2 100%  Physical Exam: Gen: Somnolent but awakens with calling out her name-more conversant when speaking Hindi CVS: Pulse regular rhythm, S1-S2 normal Resp: Poor inspiratory effort-no rales/rhonchi Abd: Soft, tenderness over RLQ Ext: No lower extremity edema, left brachiocephalic fistula  Labs: BMET  Recent Labs Lab 01/30/16 1658 01/31/16 0631 01/31/16 1000 02/01/16 0501 02/01/16 0904  NA 138  --  141 138 138  K 3.3*  --  4.5 4.4 4.4  CL 102  --  106 103 102  CO2 31  --  19* 25 23  GLUCOSE 164*  --  124* 54* 119*  BUN 10  --  13 29* 30*  CREATININE 3.39* 4.70* 4.70* 7.17* 7.58*  CALCIUM 8.3*  --  8.1* 8.1* 8.1*  PHOS  --    --  3.5 4.3  --    CBC  Recent Labs Lab 01/30/16 1658 01/31/16 0750 02/01/16 0904  WBC 13.4* 13.2* 12.0*  NEUTROABS 10.8*  --   --   HGB 9.4* 8.1* 7.6*  HCT 28.7* 26.4* 24.3*  MCV 98.6 101.5* 102.5*  PLT 259 228 206   Medications:    . bumetanide  2 mg Oral Daily  . carvedilol  25 mg Oral BID WC  . ceFEPime (MAXIPIME) IV  2 g Intravenous Q M,W,F-HD   And  . metronidazole  500 mg Intravenous Q8H  . cefTRIAXone (ROCEPHIN)  IV  1 g Intravenous Once  . diltiazem  240 mg Oral BID  . docusate sodium  100 mg Oral BID  . Ferric Citrate  210 mg Oral TID WC  . heparin subcutaneous  5,000 Units Subcutaneous Q8H  . hydrALAZINE  100 mg Oral Q8H  . losartan  50 mg Oral Daily  . metronidazole  500 mg Intravenous Once  . omega-3 acid ethyl esters  1,000 mg Oral Daily  . pravastatin  40 mg Oral QHS  . sevelamer carbonate  2,400 mg Oral TID WC  . sodium bicarbonate  650 mg Oral QHS   Elmarie Shiley, MD 02/01/2016, 11:32 AM

## 2016-02-01 NOTE — Progress Notes (Signed)
[facility] hospitalist progress note. Chief complaint. Hypoglycemia. History of present illness. This 45 year old female with acute appendicitis status post laparoscopic appendectomy. The patient was noted to have low-grade fever and hypoglycemia. Apparently the patient had not been eating post operatively. She was noted to have a blood sugar of 44. She was given an amp of D50 with improvement in her glucose to 199. I came to see the patient at bedside to ensure she remains clinically stable. I found her somnolent but easily arousable. Patient also noted with fever postoperatively MAXIMUM TEMPERATURE 102.5. Patient currently on antibiotics with Maxipime and Flagyl. Physical exam. Vital signs. Temperature 99.9, pulse 66, respiration 16, blood pressure 176/73. O2 sats 99%. General appearance. Well-developed female who is somnolent but easily arousable. Cardiac. Rate and rhythm regular.  Lungs. Breath sounds are clear. A bdomen. Soft with positive bowel sounds. Neurologic. No unilateral or focal defects noted. Impression/plan. Problem #1. Hypoglycemia. Probably secondary to poor oral intake postoperatively. He now on D5 half-normal saline with stable blood glucose. Problem #2. Fever and sepsis status post appendectomy. Patient on broad spectrum antibiotics. Problem #3. Abnormal troponin. Followed by cardiology. Suspected demand ischemia in the setting of fever and sepsis. Problem #4. Encephalopathy. Etiology has yet unclear. CT scan of the head unremarkable earlier today. CT scan of the abdomen and pelvis found no significant abnormalities with small-volume pneumoperitoneum consistent with recent laparoscopic appendectomy. We'll continue to support blood glucose with IV fluids. Continue ongoing antibiotics. Nursing will update me of any further changes.

## 2016-02-02 ENCOUNTER — Encounter (HOSPITAL_COMMUNITY): Payer: Self-pay | Admitting: *Deleted

## 2016-02-02 DIAGNOSIS — R7989 Other specified abnormal findings of blood chemistry: Secondary | ICD-10-CM

## 2016-02-02 LAB — PREPARE RBC (CROSSMATCH)

## 2016-02-02 LAB — CBC
HCT: 22.4 % — ABNORMAL LOW (ref 36.0–46.0)
Hemoglobin: 7 g/dL — ABNORMAL LOW (ref 12.0–15.0)
MCH: 31.5 pg (ref 26.0–34.0)
MCHC: 31.3 g/dL (ref 30.0–36.0)
MCV: 100.9 fL — ABNORMAL HIGH (ref 78.0–100.0)
PLATELETS: 209 10*3/uL (ref 150–400)
RBC: 2.22 MIL/uL — AB (ref 3.87–5.11)
RDW: 14.6 % (ref 11.5–15.5)
WBC: 11.9 10*3/uL — AB (ref 4.0–10.5)

## 2016-02-02 LAB — GLUCOSE, CAPILLARY
GLUCOSE-CAPILLARY: 81 mg/dL (ref 65–99)
GLUCOSE-CAPILLARY: 44 mg/dL — AB (ref 65–99)
GLUCOSE-CAPILLARY: 126 mg/dL — AB (ref 65–99)
GLUCOSE-CAPILLARY: 119 mg/dL — AB (ref 65–99)
Glucose-Capillary: 109 mg/dL — ABNORMAL HIGH (ref 65–99)
Glucose-Capillary: 190 mg/dL — ABNORMAL HIGH (ref 65–99)

## 2016-02-02 LAB — BASIC METABOLIC PANEL
ANION GAP: 13 (ref 5–15)
BUN: 35 mg/dL — AB (ref 6–20)
CALCIUM: 8 mg/dL — AB (ref 8.9–10.3)
CO2: 22 mmol/L (ref 22–32)
Chloride: 99 mmol/L — ABNORMAL LOW (ref 101–111)
Creatinine, Ser: 8.21 mg/dL — ABNORMAL HIGH (ref 0.44–1.00)
GFR calc Af Amer: 6 mL/min — ABNORMAL LOW (ref >60)
GFR, EST NON AFRICAN AMERICAN: 5 mL/min — AB (ref >60)
Glucose, Bld: 151 mg/dL — ABNORMAL HIGH (ref 65–99)
POTASSIUM: 4.6 mmol/L (ref 3.5–5.1)
SODIUM: 134 mmol/L — AB (ref 135–145)

## 2016-02-02 LAB — RENAL FUNCTION PANEL
ALBUMIN: 2.5 g/dL — AB (ref 3.5–5.0)
Anion gap: 10 (ref 5–15)
BUN: 35 mg/dL — ABNORMAL HIGH (ref 6–20)
CALCIUM: 8.1 mg/dL — AB (ref 8.9–10.3)
CO2: 23 mmol/L (ref 22–32)
CREATININE: 8.21 mg/dL — AB (ref 0.44–1.00)
Chloride: 101 mmol/L (ref 101–111)
GFR, EST AFRICAN AMERICAN: 6 mL/min — AB (ref >60)
GFR, EST NON AFRICAN AMERICAN: 5 mL/min — AB (ref >60)
Glucose, Bld: 147 mg/dL — ABNORMAL HIGH (ref 65–99)
Phosphorus: 4.6 mg/dL (ref 2.5–4.6)
Potassium: 4.6 mmol/L (ref 3.5–5.1)
SODIUM: 134 mmol/L — AB (ref 135–145)

## 2016-02-02 LAB — LACTIC ACID, PLASMA: LACTIC ACID, VENOUS: 0.9 mmol/L (ref 0.5–2.0)

## 2016-02-02 LAB — ABO/RH: ABO/RH(D): B POS

## 2016-02-02 MED ORDER — INSULIN ASPART 100 UNIT/ML ~~LOC~~ SOLN
0.0000 [IU] | Freq: Three times a day (TID) | SUBCUTANEOUS | Status: DC
  Administered 2016-02-02: 2 [IU] via SUBCUTANEOUS

## 2016-02-02 MED ORDER — SODIUM CHLORIDE 0.9 % IV SOLN: Freq: Once | INTRAVENOUS | Status: DC

## 2016-02-02 MED ORDER — INSULIN ASPART 100 UNIT/ML ~~LOC~~ SOLN
0.0000 [IU] | Freq: Three times a day (TID) | SUBCUTANEOUS | Status: DC
  Administered 2016-02-03 (×2): 2 [IU] via SUBCUTANEOUS
  Administered 2016-02-03: 3 [IU] via SUBCUTANEOUS
  Filled 2016-02-02: qty 0.09

## 2016-02-02 MED ORDER — DILTIAZEM HCL ER COATED BEADS 120 MG PO CP24
300.0000 mg | ORAL_CAPSULE | Freq: Two times a day (BID) | ORAL | Status: DC
  Administered 2016-02-02 – 2016-02-06 (×8): 300 mg via ORAL
  Filled 2016-02-02 (×8): qty 1

## 2016-02-02 MED ORDER — OXYCODONE HCL 5 MG PO TABS
5.0000 mg | ORAL_TABLET | Freq: Four times a day (QID) | ORAL | Status: DC | PRN
  Administered 2016-02-04 – 2016-02-05 (×2): 10 mg via ORAL
  Filled 2016-02-02 (×2): qty 2

## 2016-02-02 MED ORDER — ACETAMINOPHEN 325 MG PO TABS
650.0000 mg | ORAL_TABLET | ORAL | Status: DC | PRN
  Administered 2016-02-02 – 2016-02-05 (×3): 650 mg via ORAL
  Filled 2016-02-02 (×4): qty 2

## 2016-02-02 MED ORDER — INSULIN ASPART 100 UNIT/ML ~~LOC~~ SOLN: 0.0000 [IU] | Freq: Every day | SUBCUTANEOUS | Status: DC

## 2016-02-02 MED ORDER — SEVELAMER CARBONATE 800 MG PO TABS
2400.0000 mg | ORAL_TABLET | Freq: Once | ORAL | Status: AC
  Administered 2016-02-02: 2400 mg via ORAL
  Filled 2016-02-02: qty 3

## 2016-02-02 NOTE — Progress Notes (Signed)
Inpatient Diabetes Program Recommendations  AACE/ADA: New Consensus Statement on Inpatient Glycemic Control (2015)  Target Ranges:  Prepandial:   less than 140 mg/dL      Peak postprandial:   less than 180 mg/dL (1-2 hours)      Critically ill patients:  140 - 180 mg/dL  Results for Isabel Sharp, Isabel Sharp (MRN [id number]) as of 02/02/2016 10:47  Ref. Range 02/01/2016 07:45 02/01/2016 08:26 02/01/2016 12:21 02/01/2016 15:51 02/01/2016 19:38 02/01/2016 19:40 02/01/2016 20:01 02/01/2016 21:17 02/01/2016 22:20 02/01/2016 23:37 02/02/2016 03:52  Glucose-Capillary Latest Ref Range: 65-99 mg/dL 151 (H) 122 (H) 76 92 44 (LL) 46 (L) 199 (H) 162 (H) 171 (H) 147 (H) 126 (H)   Review of Glycemic Control  Diabetes history: DM2 Outpatient Diabetes medications: Lantus 15 units QAM (if CBG >140 mg/dl), Glipizide 10 mg QAM Current orders for Inpatient glycemic control: NONE  Inpatient Diabetes Program Recommendations: Correction (SSI): Please consider ordering CBGs with Novolog sensitive correction scale.   NOTE: Patient was ordered Lantus 15 units daily and Glipizide 10 mg daily which patient received both on 01/31/16 and glucose down to 44 mg/dl on 02/01/16. All orders for DM medications were discontinued after patient experienced hypoglycemia. Hypoglycemia is now resolved. Please consider ordering Novolog correction scale so patient will receive insulin if needed for inpatient glycemic control.  Thanks, Barnie Alderman, RN, MSN, CDE Diabetes Coordinator Inpatient Diabetes Program [phone] (Team Pager from [place] to [place]) [phone] ([facility] office) [phone] [facility] office) [phone] [facility] office)

## 2016-02-02 NOTE — Progress Notes (Signed)
Physician Discharge Summary  Patient ID: Isabel Sharp MRN: [id number] DOB/AGE: 47-Jun-1972 47 y.o.  Admit date: 01/30/2016   Discharge date: 02/02/2016   Transfer to Medicine note:    Admission Diagnoses:  Acute appendicitis  S/p Laparoscopic appendectomy/lysis of adhesions, 01/31/16, Dr. Lurena Joiner Kinsinger Possible sepsis Encephalopathy  ESRD on HD AODM with hypoglycemia Hypertension CAD/MI 2015/cardiomyopathy EF 35-40% on Echo 02/2014 Hypoparathyroidism   Discharge Diagnoses:  Acute appendicitis  S/p Laparoscopic appendectomy/lysis of adhesions, 01/31/16, Dr. 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 Carlus Pavlov Course:  45 yo female with 1 day of abdominal pain. Pain began in her left side then progressed to the right side. She had some nausea associated. No vomiting. Last meal was at 12:30.  She was seen in the Ed and taken to the OR.  She underwent appendectomy and did well.  She returned to the floor and was doing well the first post op day.  We planned to discharge her POD 1, but she was seen by Renal.  Dialysis planned to be done on 02/01/16.  She developed fever around 8 PM, and was treated with tylenol.  She also had issues with hypoglycemia, and was treated.  The following AM POD 2, we came to see her and she was somnolent and difficult to arouse.  She did not have a low glucose, and she did not respond to [place].   She had a low grade fever and we ask Medicine to see and assist with her  care.  Labs showed adequate ABG, BMP was stable K+ 4.4, creatinine was 7.58.  WBC was 12.0K.   CT of the head, abdomen and pelvis were normal for post op appendectomy.  She slowly regained consciousness.  She was transferred to SDU.  Troponin's were positive but low abnormal levels 1.82, 2.10, and 2.44.  She did not have chest pain, but she was not responsive when the first troponin was done.  She has been seen by cardiology and it is their opinion this is stress induced and not ischemic.  An Echo is still pending.  She is currently being transfused, she is on flagyl and Cefepime for antibiotic coverage.  Her abdominal exam is normal for a post op appendectomy.  Sore, sites are healing nicely and she has glue on the sites.  She has an AV access RUE, and a left chest dialysis cath available for use, and as sources for fever.   At this point it is Dr. Vonna Kotyk opinion she can be transferred to Medicine.  She has no further surgical issues.  We will see her again in follow up in the office in about 2-3 weeks.  Call if we can be of further assistance here in the hospital.  Dr. Thereasa Solo has accepted her in transfer to the Medicine service.  She is still spiking fevers, she had again this afternoon.    Condition on transfer:  Stable  CBC Latest Ref Rng 02/02/2016 02/01/2016 02/01/2016  WBC 4.0 - 10.5 K/uL 11.9(H) 11.4(H) 12.0(H)  Hemoglobin 12.0 - 15.0 g/dL 7.0(L)  7.4(L) 7.6(L)  Hematocrit 36.0 - 46.0 % 22.4(L) 23.7(L) 24.3(L)  Platelets 150 - 400 K/uL 209 190 206     CMP Latest Ref Rng 02/02/2016 02/02/2016 02/01/2016  Glucose 65 - 99 mg/dL 151(H) 147(H) 76  BUN 6 - 20 mg/dL 35(H) 35(H) 32(H)  Creatinine 0.44 - 1.00 mg/dL 8.21(H) 8.21(H) 7.94(H)  Sodium 135 - 145 mmol/L 134(L) 134(L) 137  Potassium 3.5 - 5.1 mmol/L 4.6 4.6 4.4  Chloride 101 - 111 mmol/L 99(L) 101 102  CO2 22 - 32 mmol/L $RemoveB'22 23 23  'NmouhDZG$ Calcium 8.9 - 10.3 mg/dL 8.0(L) 8.1(L) 8.2(L)  Total Protein 6.5 - 8.1 g/dL - - -  Total Bilirubin 0.3 - 1.2  mg/dL - - -  Alkaline Phos 38 - 126 U/L - - -  AST 15 - 41 U/L - - -  ALT 14 - 54 U/L - - -    Disposition: 02-Transferred to Medicine service for further medical management.       Medication List    TAKE these medications        acetaminophen 325 MG tablet  Commonly known as:  TYLENOL  Take 325 mg by mouth 2 (two) times daily as needed (pain).     aspirin 325 MG tablet  Take 325 mg by mouth daily.     AURYXIA 1 GM 210 MG(Fe) Tabs  Generic drug:  Ferric Citrate  Take 210 mg by mouth 3 (three) times daily with meals.     bumetanide 2 MG tablet  Commonly known as:  BUMEX  Take 2 mg by mouth daily.     carvedilol 25 MG tablet  Commonly known as:  COREG  Take 25 mg by mouth 2 (two) times daily.     diltiazem 240 MG 24 hr capsule  Commonly known as:  DILACOR XR  Take 240 mg by mouth 2 (two) times daily.     DOCOSAHEXAENOIC ACID PO  Take 1 capsule by mouth every morning.     docusate sodium 100 MG capsule  Commonly known as:  COLACE  Take 100 mg by mouth 2 (two) times daily.     docusate sodium 100 MG capsule  Commonly known as:  COLACE  Take 100 mg by mouth 2 (two) times daily as needed for mild constipation.     epoetin alfa 10000 UNIT/ML injection  Commonly known as:  EPOGEN,PROCRIT  Inject 10,000 Units into the skin every Monday, Wednesday, and Friday.     glipiZIDE 10 MG tablet  Commonly known as:  GLUCOTROL  Take 10 mg by mouth daily before breakfast.     hydrALAZINE 100 MG tablet  Commonly known as:  APRESOLINE  Take 100 mg by mouth 3 (three) times daily.     insulin glargine 100 UNIT/ML injection  Commonly known as:  LANTUS  Inject 15 Units into the skin every morning. > 140     lidocaine-prilocaine cream  Commonly known as:  EMLA  Apply 1 application topically every Monday, Wednesday, and Friday.     losartan 50 MG tablet  Commonly known as:  COZAAR  Take 50 mg by mouth daily.     Omega-3 Krill Oil 500 MG Caps  Take 500 mg by mouth daily.      oxyCODONE 5 MG immediate release tablet  Commonly known as:  Oxy IR/ROXICODONE  Take 1-2 tablets (5-10 mg total) by mouth every 4 (four) hours as needed for moderate pain.     pravastatin 40 MG tablet  Commonly known  as:  PRAVACHOL  Take 40 mg by mouth at bedtime.     sevelamer carbonate 800 MG tablet  Commonly known as:  RENVELA  Take 2,400 mg by mouth 3 (three) times daily with meals.     sodium bicarbonate 650 MG tablet  Take 650 mg by mouth at bedtime.     Vitamin D3 1000 units Caps  Take by mouth every morning.           Follow-up Information    Follow up with Mickeal Skinner, MD In 2 weeks.   Specialty:  General Surgery   Why:  Call and make an appoinment in 2 weeks after your discharge.   Contact information:   [place] [address] [phone]       Signed: Earnstine Regal 02/02/2016, 3:47 PM

## 2016-02-02 NOTE — Plan of Care (Signed)
Problem: Nutrition: Goal: Adequate nutrition will be maintained Outcome: Progressing Discussed with patient the importance of eating to maintain a steady glucose level

## 2016-02-02 NOTE — Procedures (Signed)
Assessment/ Plan:   1. Acute appendicitis status post laparoscopic appendectomy  2.ESRD: Usually MWF@[facility]  in [facility] (Dr. Neta Ehlers). Will use her right IJ TDC (awaiting evaluation of left BCF with recent infiltration). 3. Anemia: Hemoglobin low, check iron studies and restart ESA     Tolerating hemodialysis. Hgb 7, for PRBCs.  For one unit PRBCs today. Hemodynamically stable. Remains on schedule.  Using PC.  Has LUE AV access with recent infiltration. Isabel Sharp

## 2016-02-02 NOTE — Progress Notes (Signed)
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 [facility] 2. [facility] 3. [facility]  Patient Profile: 45 yo female w/ hx CABG 2015 (RCA not bypassed), HTN, DM, ESRD, HLD, was admitted 04/28 w/ acute appendicitis, fever, s/p surgery. Cards consulted 04/30 for elevated troponin, trending up. H&H trending down, being transfused.  SUBJECTIVE: No chest pain, no sig SOB. Tolerating HD, SBP 130s now  OBJECTIVE Filed Vitals:   02/02/16 1015 02/02/16 1030 02/02/16 1045 02/02/16 1100  BP: 176/58 156/65 132/73 172/66  Pulse: 62 67 67 62  Temp: 99 F (37.2 C) 98.4 F (36.9 C) 98.4 F (36.9 C)   TempSrc:  Oral    Resp: $Remo'17 18 20 19  'ZNTdq$ Height:      Weight:      SpO2: 100% 100%  100%    Intake/Output Summary (Last 24 hours) at 02/02/16 1228 Last data filed at 02/02/16 0600  Gross per 24 hour  Intake 1983.34 ml  Output      0 ml  Net 1983.34 ml   Filed Weights   01/30/16 1537 02/02/16 0828  Weight: 161 lb (73.029 kg) 168 lb 10.4 oz (76.5 kg)    PHYSICAL EXAM General: Well developed, well nourished, female in no acute distress. Head: Normocephalic, atraumatic.  Neck: Supple without bruits, JVD not elevated. Lungs:  Resp regular and unlabored, decreased BS bases Heart: RRR, S1, S2, no S3, S4, 2/6 murmur; no rub. Abdomen: Soft, tender, non-distended, BS +.  Extremities: No clubbing, cyanosis, edema.  Neuro: Alert and oriented X 3. Moves all extremities spontaneously. Psych: Normal affect.  LABS: CBC: Recent Labs  01/30/16 1658  02/01/16 1656 02/02/16 0023  WBC 13.4*  < > 11.4* 11.9*  NEUTROABS  10.8*  --   --   --   HGB 9.4*  < > 7.4* 7.0*  HCT 28.7*  < > 23.7* 22.4*  MCV 98.6  < > 102.2* 100.9*  PLT 259  < > 190 209  < > = values in this interval not displayed.  Basic Metabolic Panel:  Recent Labs  02/01/16 1656 02/02/16 0023  NA 137 134*  134*  K 4.4 4.6  4.6  CL 102 101  99*  CO2 $Re'23 23  22  'QXC$ GLUCOSE 76 147*  151*  BUN 32* 35*  35*  CREATININE 7.94* 8.21*  8.21*  CALCIUM 8.2* 8.1*  8.0*  PHOS 4.7* 4.6   Liver Function Tests: Recent Labs  01/30/16 1658  02/01/16 0904 02/01/16 1656 02/02/16 0023  AST 17  --  14*  --   --   ALT 17  --  10*  --   --   ALKPHOS 92  --  63  --   --   BILITOT 0.4  --  0.6  --   --   PROT 7.3  --  5.7*  --   --   ALBUMIN 3.6  < > 2.7* 2.5* 2.5*  < > = values in this interval not displayed. Cardiac Enzymes:  Recent Labs  02/01/16 0904 02/01/16 1133 02/01/16 2234  TROPONINI 1.82* 2.10* 2.44*   TELE:  SR, seen in HD  Radiology/Studies: Dg Abd 1 View  02/01/2016  CLINICAL DATA:  Acute encephalopathy. Pt c/o hardened, distended stomach AND general abdominal pain. EXAM: ABDOMEN - 1 VIEW COMPARISON:  None. FINDINGS: No evidence of obstruction. No dilated loops of bowel. Calcifications splenic artery noted. IMPRESSION: No acute finding Electronically Signed   By: Skipper Cliche M.D.   On: 02/01/2016 09:29   Ct Head Wo Contrast  02/01/2016  CLINICAL DATA:  Decreased responsiveness EXAM: CT HEAD WITHOUT CONTRAST TECHNIQUE: Contiguous axial images were obtained from the base of the skull through the vertex without intravenous contrast. COMPARISON:  None. FINDINGS: Brain parenchyma, ventricular system, and extra-axial space are within normal limits. No mass effect, midline shift, or acute hemorrhage. Cranium is intact. IMPRESSION: No acute intracranial pathology. Electronically Signed   By: Marybelle Killings M.D.   On: 02/01/2016 13:43   Ct Abdomen Pelvis W Contrast  02/01/2016  CLINICAL DATA:  Acute appendicitis, laparoscopic  appendectomy 01/31/16, peritonitis, fever EXAM: CT ABDOMEN AND PELVIS WITH CONTRAST TECHNIQUE: Multidetector CT imaging of the abdomen and pelvis was performed using the standard protocol following bolus administration of intravenous contrast. CONTRAST:  145mL ISOVUE-300 IOPAMIDOL (ISOVUE-300) INJECTION 61% COMPARISON:  01/30/16 FINDINGS: Lower chest: Hypoventilatory change at both lung bases. Trace pleural fluid. Hepatobiliary: Negative Pancreas: Negative Spleen: Negative Adrenals/Urinary Tract: 1 cm left adrenal myelolipoma. The right adrenal normal. Stable bilateral perinephric inflammation likely chronic. 2 cm cyst midpole left kidney. Stomach/Bowel: Postsurgical change in the region of the appendix. No evidence of significant free fluid in the abdomen or pelvis. Trace free fluid in the right pelvis. No loculated fluid collections. Vascular/Lymphatic: No acute abnormalities. Stable retroperitoneal and mesenteric lymph nodes. Reproductive: Negative Other: Small volume pneumoperitoneum consistent with recent laparoscopy. Musculoskeletal: No acute osseous abnormalities. Air in the periumbilical soft tissues anterior abdominal wall on the left with mild inflammatory change in the region consistent with recent laparoscopy. IMPRESSION: Anticipated postoperative appearance with no significant abnormalities. Small volume pneumoperitoneum consistent with recent laparoscopic appendectomy. Electronically Signed   By: Skipper Cliche M.D.   On: 02/01/2016 13:50   Dg Chest Port 1 View  02/01/2016  CLINICAL DATA:  Fever. Nonsmoker. Diabetes and coronary artery disease. EXAM: PORTABLE CHEST 1 VIEW COMPARISON:  10/26/2015 FINDINGS: Moderate cardiac enlargement. Previous median sternotomy and CABG procedure. There is a right chest wall dialysis catheter with tips in the cavoatrial junction. No pleural effusion or edema. No airspace consolidation. IMPRESSION: 1. No acute findings. 2. Cardiac enlargement. Electronically Signed    By: Kerby Moors M.D.   On: 02/01/2016 11:05     Current Medications:  . sodium chloride   Intravenous Once  . bumetanide  2 mg Oral Daily  . carvedilol  25 mg Oral BID WC  . ceFEPime (MAXIPIME) IV  2 g Intravenous Q M,W,F-HD   And  . metronidazole  500 mg Intravenous Q8H  . diltiazem  240 mg Oral BID  . docusate sodium  100 mg Oral BID  . Ferric Citrate  210 mg Oral TID WC  . heparin subcutaneous  5,000 Units Subcutaneous Q8H  . hydrALAZINE  100 mg Oral Q8H  . losartan  50 mg Oral Daily  . metronidazole  500 mg Intravenous Once  . omega-3 acid ethyl esters  1,000 mg Oral Daily  . pravastatin  40 mg Oral QHS  . sevelamer carbonate  2,400 mg Oral TID WC  . sodium bicarbonate  650 mg Oral QHS   . dextrose 5 % and 0.45% NaCl 75 mL/hr at  02/02/16 0525    ASSESSMENT AND PLAN:    Elevated troponin with history of CAD (coronary artery disease) - asymptomatic and ECG not acute. 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 Patient denies chest pain or dyspnea. Troponin is mildly elevated but patient does not clinically have acute coronary syndrome. Patient is scheduled to have an echocardiogram. If LV function is preserved she could be discharged from a cardiac standpoint. We can arrange an outpatient nuclear study for risk stratification. Continue preadmission cardiac medications at discharge. Please call  with questions. Kirk Ruths

## 2016-02-02 NOTE — Progress Notes (Signed)
CRITICAL VALUE ALERT  Critical value received:  Troponin 2.44 (2.10)  Date of notification:  02/02/16  Time of notification:  0100  Critical value read back: n/a  Nurse who received alert:  Via labs  MD notified (1st page):  Fredirick Maudlin PA  Time of first page:  0101  MD notified (2nd page): Dr. Wynonia Lawman   Time of second page: 0125 - asked to call and inform by [facility]  Responding MD:  Fredirick Maudlin PA  Time MD responded:  [phone]

## 2016-02-02 NOTE — Progress Notes (Signed)
Hypoglycemic Event  CBG:  44; check of other hand 46  Treatment: orange juice  Symptoms: drowsy; lethargic  Follow-up CBG: Time: 2000 CBG Result: 199  Possible Reasons for Event: patient is a renal patient who had an episode earlier and wasn't eating.  IVF added and 1 amp dextrose given per MD and order  Comments/MD notified:  Informed CCS, [place] and Cardiology     Jerrye Bushy Dionne

## 2016-02-02 NOTE — Progress Notes (Signed)
[place] TEAM 1 - Stepdown/ICU TEAM PROGRESS NOTE  Isabel Sharp ZOX:[id number] DOB: 11/02/1970 DOA: 01/30/2016 PCP: Joseph Art, MD  Admit HPI / Brief Narrative: 45 year old female with history of end-stage renal disease and hypertension who was admitted to the surgical service 01/30/16 with acute appendicitis and underwent laparoscopic appendicectomy and laparoscopic LOA on 4/29. She was initially doing well post-op w/ discussions about her being discharged when she spiked a fever associated w/ decreased mentation.  TRH saw her in consultation, and subsequently assumed the Attending role.  HPI/Subjective: The patient states she's feeling much better today.  She has some modest abdominal pain but no nausea or vomiting.  She does continue to have intermittent fevers.  She denies chest pain or shortness of breath.  Assessment/Plan:  FUO No clear etiology at present - CT abdomen with anticipated postoperative appearance but no acute changes - patient does have an indwelling hemodialysis catheter so must keep bacteremia in the differential - blood cultures ordered for today  Acute encephalopathy Appears to have been primarily related to hypoglycemia - alert and oriented today - CT head unremarkable  Acute appendicitis status post laparoscopic appendectomy  Ongoing care per general surgery - appears stable from this standpoint  Demand ischemia - Hx of CAD Cardiology following - awaiting TTE - no chest pain  ESRD on M/W/F HD  Nephrology following  Anemia of CKD  With history of coronary artery disease goal is to keep hemoglobin 8.0 or greater - transfused one unit today with dialysis - follow-up in a.m.  HTN Poorly controlled - adjust treatment and follow  DM w/ hypoglycemia  CBGs more stable today  - follow without change  Code Status: FULL Family Communication: no family present at time of exam Disposition Plan: SDU   Consultants: Gen Surgery  Cardiology  Nephrology    Procedures: 4/29 laparoscopic appendectomy, laparoscopic lysis of adhesions 5/1 TTE - pending   Antibiotics: Cefepime 4/28 > Flagyl 4/28 >  DVT prophylaxis: SQ heparin   Objective: Blood pressure 173/74, pulse 77, temperature 101.1 F (38.4 C), temperature source Oral, resp. rate 26, height $RemoveBe'5\' 1"'rZuZeboCR$  (1.549 m), weight 75 kg (165 lb 5.5 oz), SpO2 97 %.  Intake/Output Summary (Last 24 hours) at 02/02/16 1645 Last data filed at 02/02/16 1418  Gross per 24 hour  Intake 1928.34 ml  Output   1500 ml  Net 428.34 ml    Exam: General: No acute respiratory distress Lungs: Clear to auscultation bilaterally without wheezes or crackles Cardiovascular: Regular rate and rhythm without murmur gallop or rub normal S1 and S2 Abdomen: Only mildly tender to palpation in RLQ, soft, bowel sounds positive, no rebound, no appreciable mass Extremities: No significant cyanosis, clubbing, or edema bilateral lower extremities  Data Reviewed:  Basic Metabolic Panel:  Recent Labs Lab 01/31/16 1000 02/01/16 0501 02/01/16 0904 02/01/16 1656 02/02/16 0023  NA 141 138 138 137 134*  134*  K 4.5 4.4 4.4 4.4 4.6  4.6  CL 106 103 102 102 101  99*  CO2 19* $Remov'25 23 23 23  22  'ukVYHZ$ GLUCOSE 124* 54* 119* 76 147*  151*  BUN 13 29* 30* 32* 35*  35*  CREATININE 4.70* 7.17* 7.58* 7.94* 8.21*  8.21*  CALCIUM 8.1* 8.1* 8.1* 8.2* 8.1*  8.0*  PHOS 3.5 4.3  --  4.7* 4.6    CBC:  Recent Labs Lab 01/30/16 1658 01/31/16 0750 02/01/16 0904 02/01/16 1656 02/02/16 0023  WBC 13.4* 13.2* 12.0* 11.4* 11.9*  NEUTROABS 10.8*  --   --   --   --  HGB 9.4* 8.1* 7.6* 7.4* 7.0*  HCT 28.7* 26.4* 24.3* 23.7* 22.4*  MCV 98.6 101.5* 102.5* 102.2* 100.9*  PLT 259 228 206 190 209    Liver Function Tests:  Recent Labs Lab 01/30/16 1658 01/31/16 1000 02/01/16 0501 02/01/16 0904 02/01/16 1656 02/02/16 0023  AST 17  --   --  14*  --   --   ALT 17  --   --  10*  --   --   ALKPHOS 92  --   --  63  --   --    BILITOT 0.4  --   --  0.6  --   --   PROT 7.3  --   --  5.7*  --   --   ALBUMIN 3.6 3.0* 2.5* 2.7* 2.5* 2.5*    Recent Labs Lab 01/30/16 1658  LIPASE 59*   Cardiac Enzymes:  Recent Labs Lab 02/01/16 0904 02/01/16 1133 02/01/16 2234  TROPONINI 1.82* 2.10* 2.44*    CBG:  Recent Labs Lab 02/01/16 2337 02/02/16 0352 02/02/16 1217 02/02/16 1304 02/02/16 1549  GLUCAP 147* 126* 109* 119* 190*    Recent Results (from the past 240 hour(s))  MRSA PCR Screening     Status: None   Collection Time: 02/01/16  4:00 PM  Result Value Ref Range Status   MRSA by PCR NEGATIVE NEGATIVE Final    Comment:        The GeneXpert MRSA Assay (FDA approved for NASAL specimens only), is one component of a comprehensive MRSA colonization surveillance program. It is not intended to diagnose MRSA infection nor to guide or monitor treatment for MRSA infections.      Studies:   Recent x-ray studies have been reviewed in detail by the Attending Physician  Scheduled Meds:  Scheduled Meds: . bumetanide  2 mg Oral Daily  . carvedilol  25 mg Oral BID WC  . ceFEPime (MAXIPIME) IV  2 g Intravenous Q M,W,F-HD   And  . metronidazole  500 mg Intravenous Q8H  . diltiazem  240 mg Oral BID  . docusate sodium  100 mg Oral BID  . Ferric Citrate  210 mg Oral TID WC  . heparin subcutaneous  5,000 Units Subcutaneous Q8H  . hydrALAZINE  100 mg Oral Q8H  . losartan  50 mg Oral Daily  . omega-3 acid ethyl esters  1,000 mg Oral Daily  . pravastatin  40 mg Oral QHS  . sevelamer carbonate  2,400 mg Oral TID WC  . sodium bicarbonate  650 mg Oral QHS    Time spent on care of this patient: 35 mins   Noah Lembke T , MD   [facility] Office  [phone] Pager - Text Page per Shea Evans as per below:  On-Call/Text Page:      [email or web address]      password [email or web address]  If 7PM-7AM, please contact night-coverage [email or web address] Password [email or web address] 02/02/2016, 4:45 PM   LOS: 1 day

## 2016-02-02 NOTE — Progress Notes (Signed)
3 Days Post-Op  Subjective: Patient is awake and alert. No complaints of any significant pain, shortness of breath Hgb down today  Objective: Vital signs in last 24 hours: Temp:  [98.4 F (36.9 C)-101.6 F (38.7 C)] 98.7 F (37.1 C) (05/01 0737) Pulse Rate:  [59-73] 64 (05/01 0737) Resp:  [0-21] 18 (05/01 0737) BP: (118-176)/(49-76) 136/62 mmHg (05/01 0737) SpO2:  [99 %-100 %] 100 % (05/01 0737) Last BM Date: 01/30/16  Intake/Output from previous day: 04/30 0701 - 05/01 0700 In: 1983.3 [P.O.:870; I.V.:813.3; IV Piggyback:300] Out: 300 [Urine:300] Intake/Output this shift:    General appearance: alert, cooperative and no distress Resp: clear to auscultation bilaterally Cardio: regular rate and rhythm, S1, S2 normal, no murmur, click, rub or gallop GI: soft, mild RLQ tenderness Incisions c/d/i  Lab Results:   Recent Labs  02/01/16 1656 02/02/16 0023  WBC 11.4* 11.9*  HGB 7.4* 7.0*  HCT 23.7* 22.4*  PLT 190 209   BMET  Recent Labs  02/01/16 1656 02/02/16 0023  NA 137 134*  134*  K 4.4 4.6  4.6  CL 102 101  99*  CO2 $Re'23 23  22  'ENt$ GLUCOSE 76 147*  151*  BUN 32* 35*  35*  CREATININE 7.94* 8.21*  8.21*  CALCIUM 8.2* 8.1*  8.0*   PT/INR No results for input(s): LABPROT, INR in the last 72 hours. ABG  Recent Labs  02/01/16 0935  PHART 7.427  HCO3 24.7*    Studies/Results: Dg Abd 1 View  02/01/2016  CLINICAL DATA:  Acute encephalopathy. Pt c/o hardened, distended stomach AND general abdominal pain. EXAM: ABDOMEN - 1 VIEW COMPARISON:  None. FINDINGS: No evidence of obstruction. No dilated loops of bowel. Calcifications splenic artery noted. IMPRESSION: No acute finding Electronically Signed   By: Skipper Cliche M.D.   On: 02/01/2016 09:29   Ct Head Wo Contrast  02/01/2016  CLINICAL DATA:  Decreased responsiveness EXAM: CT HEAD WITHOUT CONTRAST TECHNIQUE: Contiguous axial images were obtained from the base of the skull through the vertex without  intravenous contrast. COMPARISON:  None. FINDINGS: Brain parenchyma, ventricular system, and extra-axial space are within normal limits. No mass effect, midline shift, or acute hemorrhage. Cranium is intact. IMPRESSION: No acute intracranial pathology. Electronically Signed   By: Marybelle Killings M.D.   On: 02/01/2016 13:43   Ct Abdomen Pelvis W Contrast  02/01/2016  CLINICAL DATA:  Acute appendicitis, laparoscopic appendectomy 01/31/16, peritonitis, fever EXAM: CT ABDOMEN AND PELVIS WITH CONTRAST TECHNIQUE: Multidetector CT imaging of the abdomen and pelvis was performed using the standard protocol following bolus administration of intravenous contrast. CONTRAST:  133mL ISOVUE-300 IOPAMIDOL (ISOVUE-300) INJECTION 61% COMPARISON:  01/30/16 FINDINGS: Lower chest: Hypoventilatory change at both lung bases. Trace pleural fluid. Hepatobiliary: Negative Pancreas: Negative Spleen: Negative Adrenals/Urinary Tract: 1 cm left adrenal myelolipoma. The right adrenal normal. Stable bilateral perinephric inflammation likely chronic. 2 cm cyst midpole left kidney. Stomach/Bowel: Postsurgical change in the region of the appendix. No evidence of significant free fluid in the abdomen or pelvis. Trace free fluid in the right pelvis. No loculated fluid collections. Vascular/Lymphatic: No acute abnormalities. Stable retroperitoneal and mesenteric lymph nodes. Reproductive: Negative Other: Small volume pneumoperitoneum consistent with recent laparoscopy. Musculoskeletal: No acute osseous abnormalities. Air in the periumbilical soft tissues anterior abdominal wall on the left with mild inflammatory change in the region consistent with recent laparoscopy. IMPRESSION: Anticipated postoperative appearance with no significant abnormalities. Small volume pneumoperitoneum consistent with recent laparoscopic appendectomy. Electronically Signed   By: Skipper Cliche  M.D.   On: 02/01/2016 13:50   Dg Chest Port 1 View  02/01/2016  CLINICAL DATA:   Fever. Nonsmoker. Diabetes and coronary artery disease. EXAM: PORTABLE CHEST 1 VIEW COMPARISON:  10/26/2015 FINDINGS: Moderate cardiac enlargement. Previous median sternotomy and CABG procedure. There is a right chest wall dialysis catheter with tips in the cavoatrial junction. No pleural effusion or edema. No airspace consolidation. IMPRESSION: 1. No acute findings. 2. Cardiac enlargement. Electronically Signed   By: Kerby Moors M.D.   On: 02/01/2016 11:05    Anti-infectives: Anti-infectives    Start     Dose/Rate Route Frequency Ordered Stop   01/31/16 0300  ceFEPIme (MAXIPIME) 2 g in dextrose 5 % 50 mL IVPB     2 g 100 mL/hr over 30 Minutes Intravenous Every M-W-F (Hemodialysis) 01/31/16 0156     01/31/16 0300  metroNIDAZOLE (FLAGYL) IVPB 500 mg     500 mg 100 mL/hr over 60 Minutes Intravenous Every 8 hours 01/31/16 0156     01/30/16 2215  cefTRIAXone (ROCEPHIN) 1 g in dextrose 5 % 50 mL IVPB  Status:  Discontinued    Comments:  On call for OR   1 g 100 mL/hr over 30 Minutes Intravenous  Once 01/30/16 2211 02/01/16 2111   01/30/16 2215  metroNIDAZOLE (FLAGYL) IVPB 500 mg    Comments:  On call for OR   500 mg 100 mL/hr over 60 Minutes Intravenous  Once 01/30/16 2211     01/30/16 1645  ciprofloxacin (CIPRO) IVPB 400 mg     400 mg 200 mL/hr over 60 Minutes Intravenous  Once 01/30/16 1641 01/30/16 1745   01/30/16 1645  metroNIDAZOLE (FLAGYL) IVPB 500 mg     500 mg 100 mL/hr over 60 Minutes Intravenous  Once 01/30/16 1641 01/30/16 1906      Assessment/Plan: s/p Procedure(s): APPENDECTOMY LAPAROSCOPIC (N/A) Continue antibiotics until fever resolves/ WBC normalizes Dialysis today - TRH/ Renal can make decision on transfusion while on dialysis  Would like to transfer patient to [facility] service since her surgical issues are essentially resolved.  We will continue to follow while she is in the hospital.   LOS: 1 day    Isabel Mitchner K. 02/02/2016

## 2016-02-03 ENCOUNTER — Inpatient Hospital Stay (HOSPITAL_COMMUNITY): Payer: BLUE CROSS/BLUE SHIELD

## 2016-02-03 DIAGNOSIS — R400 Somnolence: Secondary | ICD-10-CM

## 2016-02-03 DIAGNOSIS — K353 Acute appendicitis with localized peritonitis: Secondary | ICD-10-CM

## 2016-02-03 DIAGNOSIS — E118 Type 2 diabetes mellitus with unspecified complications: Secondary | ICD-10-CM

## 2016-02-03 DIAGNOSIS — R030 Elevated blood-pressure reading, without diagnosis of hypertension: Secondary | ICD-10-CM

## 2016-02-03 DIAGNOSIS — G9340 Encephalopathy, unspecified: Secondary | ICD-10-CM

## 2016-02-03 DIAGNOSIS — IMO0002 Reserved for concepts with insufficient information to code with codable children: Secondary | ICD-10-CM | POA: Diagnosis present

## 2016-02-03 DIAGNOSIS — E1165 Type 2 diabetes mellitus with hyperglycemia: Secondary | ICD-10-CM

## 2016-02-03 DIAGNOSIS — R509 Fever, unspecified: Secondary | ICD-10-CM | POA: Diagnosis present

## 2016-02-03 DIAGNOSIS — IMO0001 Reserved for inherently not codable concepts without codable children: Secondary | ICD-10-CM | POA: Diagnosis present

## 2016-02-03 DIAGNOSIS — R4182 Altered mental status, unspecified: Secondary | ICD-10-CM | POA: Diagnosis present

## 2016-02-03 LAB — CBC
HCT: 25.2 % — ABNORMAL LOW (ref 36.0–46.0)
HEMOGLOBIN: 8.3 g/dL — AB (ref 12.0–15.0)
MCH: 30.9 pg (ref 26.0–34.0)
MCHC: 32.9 g/dL (ref 30.0–36.0)
MCV: 93.7 fL (ref 78.0–100.0)
Platelets: 185 10*3/uL (ref 150–400)
RBC: 2.69 MIL/uL — AB (ref 3.87–5.11)
RDW: 17.9 % — ABNORMAL HIGH (ref 11.5–15.5)
WBC: 9.5 10*3/uL (ref 4.0–10.5)

## 2016-02-03 LAB — RENAL FUNCTION PANEL
ALBUMIN: 2.4 g/dL — AB (ref 3.5–5.0)
ANION GAP: 11 (ref 5–15)
BUN: 21 mg/dL — ABNORMAL HIGH (ref 6–20)
CALCIUM: 8 mg/dL — AB (ref 8.9–10.3)
CO2: 26 mmol/L (ref 22–32)
Chloride: 98 mmol/L — ABNORMAL LOW (ref 101–111)
Creatinine, Ser: 4.99 mg/dL — ABNORMAL HIGH (ref 0.44–1.00)
GFR, EST AFRICAN AMERICAN: 11 mL/min — AB (ref >60)
GFR, EST NON AFRICAN AMERICAN: 9 mL/min — AB (ref >60)
GLUCOSE: 164 mg/dL — AB (ref 65–99)
PHOSPHORUS: 4.3 mg/dL (ref 2.5–4.6)
Potassium: 3.9 mmol/L (ref 3.5–5.1)
SODIUM: 135 mmol/L (ref 135–145)

## 2016-02-03 LAB — GLUCOSE, CAPILLARY
GLUCOSE-CAPILLARY: 230 mg/dL — AB (ref 65–99)
GLUCOSE-CAPILLARY: 176 mg/dL — AB (ref 65–99)
Glucose-Capillary: 151 mg/dL — ABNORMAL HIGH (ref 65–99)
Glucose-Capillary: 156 mg/dL — ABNORMAL HIGH (ref 65–99)
Glucose-Capillary: 162 mg/dL — ABNORMAL HIGH (ref 65–99)

## 2016-02-03 LAB — TYPE AND SCREEN
ABO/RH(D): B POS
ANTIBODY SCREEN: NEGATIVE
Unit division: 0

## 2016-02-03 LAB — HEPATIC FUNCTION PANEL
ALBUMIN: 2.4 g/dL — AB (ref 3.5–5.0)
ALK PHOS: 58 U/L (ref 38–126)
ALT: 11 U/L — ABNORMAL LOW (ref 14–54)
AST: 12 U/L — ABNORMAL LOW (ref 15–41)
Bilirubin, Direct: 0.2 mg/dL (ref 0.1–0.5)
Indirect Bilirubin: 0.3 mg/dL (ref 0.3–0.9)
TOTAL PROTEIN: 5.7 g/dL — AB (ref 6.5–8.1)
Total Bilirubin: 0.5 mg/dL (ref 0.3–1.2)

## 2016-02-03 LAB — HEPATITIS B SURFACE ANTIGEN: Hepatitis B Surface Ag: NEGATIVE

## 2016-02-03 LAB — LIPASE, BLOOD: LIPASE: 34 U/L (ref 11–51)

## 2016-02-03 MED ORDER — LOSARTAN POTASSIUM 50 MG PO TABS
100.0000 mg | ORAL_TABLET | Freq: Every day | ORAL | Status: DC
  Administered 2016-02-04 – 2016-02-06 (×3): 100 mg via ORAL
  Filled 2016-02-03 (×4): qty 2

## 2016-02-03 MED ORDER — INSULIN ASPART 100 UNIT/ML ~~LOC~~ SOLN
0.0000 [IU] | SUBCUTANEOUS | Status: DC
  Administered 2016-02-04: 2 [IU] via SUBCUTANEOUS

## 2016-02-03 MED ORDER — POLYETHYLENE GLYCOL 3350 17 G PO PACK
17.0000 g | PACK | Freq: Every day | ORAL | Status: DC
  Administered 2016-02-03 – 2016-02-04 (×2): 17 g via ORAL
  Filled 2016-02-03 (×2): qty 1

## 2016-02-03 MED ORDER — LOSARTAN POTASSIUM 50 MG PO TABS
50.0000 mg | ORAL_TABLET | Freq: Once | ORAL | Status: AC
  Administered 2016-02-03: 50 mg via ORAL
  Filled 2016-02-03: qty 1

## 2016-02-03 NOTE — Plan of Care (Signed)
Problem: Education: Goal: Knowledge of [place] [facility] Education information/materials will improve Outcome: Progressing Discussed with husband her treatments during the day shift.

## 2016-02-03 NOTE — Progress Notes (Signed)
[place] TEAM 1 - Stepdown/ICU TEAM Progress Note  Isabel Sharp QQP:[id number] DOB: [birth year] DOA: 01/30/2016 PCP: Joseph Art, MD  Admit HPI / Brief Narrative: 45 year old female PMHx ESRD M/W/F HD,  HTN, DM Type 2 with complication    Who was admitted to the surgical service 01/30/16 with acute appendicitis and underwent laparoscopic appendicectomy and laparoscopic lysis of adhesions on 4/29. She was initially doing well post-op w/ discussions about her being discharged when she spiked a fever associated w/ decreased mentation. TRH saw her in consultation, and subsequently assumed the Attending role.  HPI/Subjective: 5/2 MAXIMUM TEMPERATURE overnight 38.4C. However states feels much better. Consuming her lunch, negative postprandial N/V. Still some abdominal tenderness. Biggest concern is feels bloated has not had BM since Friday.  Assessment/Plan: FUO -No clear etiology at present - CT abdomen with anticipated postoperative appearance but no acute changes  - patient does have an indwelling hemodialysis catheter so must keep bacteremia in the differential  - blood cultures pending  Acute encephalopathy -Resolved   Acute appendicitis status post laparoscopic appendectomy  -Ongoing care per general surgery - appears stable from this standpoint  Demand ischemia - Hx of CAD -Cardiology following  -Echocardiogram pending  ESRD on M/W/F HD  -Nephrology following  Anemia of CKD  -Transfuse for hemoglobin <8  -5/1 transfused one unit PRBC  HTN -Coreg 25 mg  BID -Diltiazem 300 mg BID -Hydralazine 100 mg  TID -Increase Losartan 100 mg daily  -BP improved but still Poorly controlled   DM w/ hypoglycemia  -A1c pending  -Lipid panel pending  -Increase to moderate SSI    Code Status: FULL Family Communication: family present at time of exam Disposition Plan: Resolution FUO/per surgery    Consultants: Gen Surgery  Cardiology  Nephrology    Procedure/Significant Events: 4/29 laparoscopic appendectomy, laparoscopic lysis of adhesions 5/1 Echocardiogram - pending  5/1 transfused one unit PRBC   Culture 4/30 MRSA by PCR negative 5/1 blood right hand 2 NGTD   Antibiotics: Cefepime 4/28 > Flagyl 4/28 >  DVT prophylaxis: Subcutaneous heparin   Devices NA   LINES / TUBES:  NA    Continuous Infusions: . dextrose 5 % and 0.45% NaCl 10 mL/hr at 02/02/16 1604    Objective: VITAL SIGNS: Temp: 99 F (37.2 C) (05/02 1935) Temp Source: Oral (05/02 1935) BP: 157/64 mmHg (05/02 1935) Pulse Rate: 62 (05/02 1935) SPO2; FIO2:   Intake/Output Summary (Last 24 hours) at 02/03/16 2020 Last data filed at 02/03/16 1500  Gross per 24 hour  Intake 725.83 ml  Output    300 ml  Net 425.83 ml     Exam: General: A/O 4, NAD, No acute respiratory distress Eyes: negative scleral hemorrhage, negative icterus ENT: Negative Runny nose, negative gingival bleeding, Neck:  Negative scars, masses, torticollis, lymphadenopathy, JVD Lungs: Clear to auscultation bilaterally without wheezes or crackles Cardiovascular: Regular rate and rhythm without murmur gallop or rub normal S1 and S2 Abdomen: Positive pain to palpation generalized, positive distended, positive soft, bowel sounds, no rebound, no ascites, no appreciable mass Extremities: No significant cyanosis, clubbing, or edema bilateral lower extremities Psychiatric:  Negative depression, negative anxiety, negative fatigue, negative mania  Neurologic:  Cranial nerves II through XII intact, tongue/uvula midline, all extremities muscle strength 5/5, sensation intact throughout, negative dysarthria, negative expressive aphasia, negative receptive aphasia.   Data Reviewed: Basic Metabolic Panel:  Recent Labs Lab 01/31/16 1000 02/01/16 0501 02/01/16 0904 02/01/16 1656 02/02/16 0023 02/03/16 0420  NA 141 138 138 137 134*  134* 135  K 4.5 4.4 4.4 4.4 4.6  4.6 3.9   CL 106 103 102 102 101  99* 98*  CO2 19* $Remov'25 23 23 23  22 26  'ZOMcuM$ GLUCOSE 124* 54* 119* 76 147*  151* 164*  BUN 13 29* 30* 32* 35*  35* 21*  CREATININE 4.70* 7.17* 7.58* 7.94* 8.21*  8.21* 4.99*  CALCIUM 8.1* 8.1* 8.1* 8.2* 8.1*  8.0* 8.0*  PHOS 3.5 4.3  --  4.7* 4.6 4.3   Liver Function Tests:  Recent Labs Lab 01/30/16 1658  02/01/16 0904 02/01/16 1656 02/02/16 0023 02/03/16 0335 02/03/16 0420  AST 17  --  14*  --   --  12*  --   ALT 17  --  10*  --   --  11*  --   ALKPHOS 92  --  63  --   --  58  --   BILITOT 0.4  --  0.6  --   --  0.5  --   PROT 7.3  --  5.7*  --   --  5.7*  --   ALBUMIN 3.6  < > 2.7* 2.5* 2.5* 2.4* 2.4*  < > = values in this interval not displayed.  Recent Labs Lab 01/30/16 1658 02/03/16 0335  LIPASE 59* 34   No results for input(s): AMMONIA in the last 168 hours. CBC:  Recent Labs Lab 01/30/16 1658 01/31/16 0750 02/01/16 0904 02/01/16 1656 02/02/16 0023 02/03/16 0335  WBC 13.4* 13.2* 12.0* 11.4* 11.9* 9.5  NEUTROABS 10.8*  --   --   --   --   --   HGB 9.4* 8.1* 7.6* 7.4* 7.0* 8.3*  HCT 28.7* 26.4* 24.3* 23.7* 22.4* 25.2*  MCV 98.6 101.5* 102.5* 102.2* 100.9* 93.7  PLT 259 228 206 190 209 185   Cardiac Enzymes:  Recent Labs Lab 02/01/16 0904 02/01/16 1133 02/01/16 2234  TROPONINI 1.82* 2.10* 2.44*   BNP (last 3 results) No results for input(s): BNP in the last 8760 hours.  ProBNP (last 3 results) No results for input(s): PROBNP in the last 8760 hours.  CBG:  Recent Labs Lab 02/02/16 1549 02/02/16 2206 02/03/16 0822 02/03/16 1305 02/03/16 1735  GLUCAP 190* 151* 156* 230* 176*    Recent Results (from the past 240 hour(s))  MRSA PCR Screening     Status: None   Collection Time: 02/01/16  4:00 PM  Result Value Ref Range Status   MRSA by PCR NEGATIVE NEGATIVE Final    Comment:        The GeneXpert MRSA Assay (FDA approved for NASAL specimens only), is one component of a comprehensive MRSA  colonization surveillance program. It is not intended to diagnose MRSA infection nor to guide or monitor treatment for MRSA infections.   Culture, blood (Routine X 2) w Reflex to ID Panel     Status: None (Preliminary result)   Collection Time: 02/02/16  5:38 PM  Result Value Ref Range Status   Specimen Description BLOOD RIGHT HAND  Final   Special Requests BOTTLES DRAWN AEROBIC AND ANAEROBIC 5ML  Final   Culture NO GROWTH < 24 HOURS  Final   Report Status PENDING  Incomplete  Culture, blood (Routine X 2) w Reflex to ID Panel     Status: None (Preliminary result)   Collection Time: 02/02/16  5:43 PM  Result Value Ref Range Status   Specimen Description BLOOD RIGHT HAND  Final   Special Requests IN PEDIATRIC BOTTLE 3ML  Final  Culture NO GROWTH < 24 HOURS  Final   Report Status PENDING  Incomplete     Studies:  Recent x-ray studies have been reviewed in detail by the Attending Physician  Scheduled Meds:  Scheduled Meds: . bumetanide  2 mg Oral Daily  . carvedilol  25 mg Oral BID WC  . ceFEPime (MAXIPIME) IV  2 g Intravenous Q M,W,F-HD   And  . metronidazole  500 mg Intravenous Q8H  . diltiazem  300 mg Oral BID  . docusate sodium  100 mg Oral BID  . Ferric Citrate  210 mg Oral TID WC  . heparin subcutaneous  5,000 Units Subcutaneous Q8H  . hydrALAZINE  100 mg Oral Q8H  . insulin aspart  0-15 Units Subcutaneous Q4H  . insulin aspart  0-5 Units Subcutaneous QHS  . [START ON 02/04/2016] losartan  100 mg Oral Daily  . losartan  50 mg Oral Once  . omega-3 acid ethyl esters  1,000 mg Oral Daily  . polyethylene glycol  17 g Oral Daily  . pravastatin  40 mg Oral QHS  . sevelamer carbonate  2,400 mg Oral TID WC  . sodium bicarbonate  650 mg Oral QHS    Time spent on care of this patient: 40 mins   WOODS, Geraldo Docker , MD  [facility] Office  [phone] Pager - [phone]  On-Call/Text Page:      [email or web address]      password [email or web address]  If 7PM-7AM, please contact  night-coverage [email or web address] Password [email or web address] 02/03/2016, 8:20 PM   LOS: 2 days   Care during the described time interval was provided by me .  I have reviewed this patient's available data, including medical history, events of note, physical examination, and all test results as part of my evaluation. I have personally reviewed and interpreted all radiology studies.   Dia Crawford, MD [phone] Pager

## 2016-02-03 NOTE — Progress Notes (Signed)
4 Days Post-Op  Subjective: She is alert, smiling, talking with no complaints, has not been ambulated so far.  Port sites all look fine.   Objective: Vital signs in last 24 hours: Temp:  [98.5 F (36.9 C)-100.4 F (38 C)] 98.8 F (37.1 C) (05/02 1200) Pulse Rate:  [56-75] 62 (05/02 1300) Resp:  [11-27] 23 (05/02 1300) BP: (128-178)/(52-79) 164/63 mmHg (05/02 1300) SpO2:  [95 %-100 %] 97 % (05/02 1300) Weight:  [78.7 kg (173 lb 8 oz)] 78.7 kg (173 lb 8 oz) (05/02 0312) Last BM Date: 01/30/16 480 PO Urine 200 HD 1500 Tm 100.4 1500 yesterday, afebrile since. WBC is normal H/H up to 8.3/25 with transfusion Intake/Output from previous day: 05/01 0701 - 05/02 0700 In: 1649.3 [P.O.:480; I.V.:819.3; IV Piggyback:350] Out: 1700 [Urine:200] Intake/Output this shift: Total I/O In: 180 [I.V.:80; IV Piggyback:100] Out: 100 [Emesis/NG output:100]  General appearance: alert, cooperative and no distress GI: soft, sore, sites look fine, tolerating diet.  Lab Results:   Recent Labs  02/02/16 0023 02/03/16 0335  WBC 11.9* 9.5  HGB 7.0* 8.3*  HCT 22.4* 25.2*  PLT 209 185    BMET  Recent Labs  02/02/16 0023 02/03/16 0420  NA 134*  134* 135  K 4.6  4.6 3.9  CL 101  99* 98*  CO2 $Re'23  22 26  'aua$ GLUCOSE 147*  151* 164*  BUN 35*  35* 21*  CREATININE 8.21*  8.21* 4.99*  CALCIUM 8.1*  8.0* 8.0*   PT/INR No results for input(s): LABPROT, INR in the last 72 hours.   Recent Labs Lab 01/30/16 1658  02/01/16 0904 02/01/16 1656 02/02/16 0023 02/03/16 0335 02/03/16 0420  AST 17  --  14*  --   --  12*  --   ALT 17  --  10*  --   --  11*  --   ALKPHOS 92  --  63  --   --  58  --   BILITOT 0.4  --  0.6  --   --  0.5  --   PROT 7.3  --  5.7*  --   --  5.7*  --   ALBUMIN 3.6  < > 2.7* 2.5* 2.5* 2.4* 2.4*  < > = values in this interval not displayed.   Lipase     Component Value Date/Time   LIPASE 34 02/03/2016 0335     Studies/Results: No results  found.  Medications: . bumetanide  2 mg Oral Daily  . carvedilol  25 mg Oral BID WC  . ceFEPime (MAXIPIME) IV  2 g Intravenous Q M,W,F-HD   And  . metronidazole  500 mg Intravenous Q8H  . diltiazem  300 mg Oral BID  . docusate sodium  100 mg Oral BID  . Ferric Citrate  210 mg Oral TID WC  . heparin subcutaneous  5,000 Units Subcutaneous Q8H  . hydrALAZINE  100 mg Oral Q8H  . insulin aspart  0-5 Units Subcutaneous QHS  . insulin aspart  0-9 Units Subcutaneous TID WC  . losartan  50 mg Oral Daily  . omega-3 acid ethyl esters  1,000 mg Oral Daily  . pravastatin  40 mg Oral QHS  . sevelamer carbonate  2,400 mg Oral TID WC  . sodium bicarbonate  650 mg Oral QHS    Assessment/Plan Acute appendicitis  S/p Laparoscopic appendectomy/lysis of adhesions, 01/31/16, Dr. Lurena Joiner Kinsinger Possible sepsis - uncertain etiology Elevated troponin  Encephalopathy  ESRD on HD AODM with hypoglycemia Hypertension CAD/MI  2015/cardiomyopathy EF 35-40% on Echo 02/2014 - now up to 55% 10/2015 Hypoparathyroidism FEN:  Renal diet ID: day 5 cefepime/flagyl VTE:  SCD/transfused for anemia, heparin for HD  Plan:  Looks much better than she did 2 days ago.  Nothing to add at this time.       LOS: 2 days    JENNINGS,WILLARD 02/03/2016 [id number]  Imogene Burn. Georgette Dover, MD, [facility]  General/ Trauma Surgery  02/03/2016 4:54 PM

## 2016-02-03 NOTE — Progress Notes (Signed)
[facility] Progress Note   Subjective: vomited once, no sig abd pain or new issues.  No cough or SOB.  Tmax down 100.4 yest pm.  VSS  Filed Vitals:   02/03/16 1000 02/03/16 1100 02/03/16 1200 02/03/16 1300  BP: 173/72 159/66 166/67 164/63  Pulse: 67 66 63 62  Temp:   98.8 F (37.1 C)   TempSrc:   Oral   Resp: $Remo'22 14 27 23  'hNEnZ$ Height:      Weight:      SpO2: 100% 95% 96% 97%    Inpatient medications: . bumetanide  2 mg Oral Daily  . carvedilol  25 mg Oral BID WC  . ceFEPime (MAXIPIME) IV  2 g Intravenous Q M,W,F-HD   And  . metronidazole  500 mg Intravenous Q8H  . diltiazem  300 mg Oral BID  . docusate sodium  100 mg Oral BID  . Ferric Citrate  210 mg Oral TID WC  . heparin subcutaneous  5,000 Units Subcutaneous Q8H  . hydrALAZINE  100 mg Oral Q8H  . insulin aspart  0-5 Units Subcutaneous QHS  . insulin aspart  0-9 Units Subcutaneous TID WC  . losartan  50 mg Oral Daily  . omega-3 acid ethyl esters  1,000 mg Oral Daily  . pravastatin  40 mg Oral QHS  . sevelamer carbonate  2,400 mg Oral TID WC  . sodium bicarbonate  650 mg Oral QHS   . dextrose 5 % and 0.45% NaCl 10 mL/hr at 02/02/16 1604   acetaminophen, morphine injection, naLOXone (NARCAN)  injection, ondansetron **OR** ondansetron (ZOFRAN) IV, oxyCODONE  Exam: Alert no distress, calm No jvd Chest clear bilat RRR no mrg Abd obese soft mildly tender SP area +bs No wounds or ulcers, no LE edema LUA AVF +bruit, and IJ cath  Dialysis: [facility] [place]  4h  165 lbs  Using HD cath now d/t infiltration of AVF      Assessment: 1 S/P appendectomy  2 Fevers - post op, unclear source. Blood cx's negative from 5/1. Cath sepsis unlikely but keep it in mind 3 ESRD on HD MWF. Using cath not AVF d/t infiltration 4 Anemia Hb 8.3, check Fe stores in am  Plan - HD tomorrow, check Aurea Graff MD [facility] pager [phone]    cell [phone] 02/03/2016, 4:49 PM    Recent Labs Lab  02/01/16 1656 02/02/16 0023 02/03/16 0420  NA 137 134*  134* 135  K 4.4 4.6  4.6 3.9  CL 102 101  99* 98*  CO2 $Re'23 23  22 26  'Zko$ GLUCOSE 76 147*  151* 164*  BUN 32* 35*  35* 21*  CREATININE 7.94* 8.21*  8.21* 4.99*  CALCIUM 8.2* 8.1*  8.0* 8.0*  PHOS 4.7* 4.6 4.3    Recent Labs Lab 01/30/16 1658  02/01/16 0904  02/02/16 0023 02/03/16 0335 02/03/16 0420  AST 17  --  14*  --   --  12*  --   ALT 17  --  10*  --   --  11*  --   ALKPHOS 92  --  63  --   --  58  --   BILITOT 0.4  --  0.6  --   --  0.5  --   PROT 7.3  --  5.7*  --   --  5.7*  --   ALBUMIN 3.6  < > 2.7*  < > 2.5* 2.4* 2.4*  < > = values  in this interval not displayed.  Recent Labs Lab 01/30/16 1658  02/01/16 1656 02/02/16 0023 02/03/16 0335  WBC 13.4*  < > 11.4* 11.9* 9.5  NEUTROABS 10.8*  --   --   --   --   HGB 9.4*  < > 7.4* 7.0* 8.3*  HCT 28.7*  < > 23.7* 22.4* 25.2*  MCV 98.6  < > 102.2* 100.9* 93.7  PLT 259  < > 190 209 185  < > = values in this interval not displayed.

## 2016-02-04 ENCOUNTER — Inpatient Hospital Stay (HOSPITAL_COMMUNITY): Payer: BLUE CROSS/BLUE SHIELD

## 2016-02-04 DIAGNOSIS — R079 Chest pain, unspecified: Secondary | ICD-10-CM

## 2016-02-04 LAB — COMPREHENSIVE METABOLIC PANEL
ALBUMIN: 2.5 g/dL — AB (ref 3.5–5.0)
ALK PHOS: 58 U/L (ref 38–126)
ALT: 10 U/L — ABNORMAL LOW (ref 14–54)
ANION GAP: 9 (ref 5–15)
AST: 10 U/L — AB (ref 15–41)
BILIRUBIN TOTAL: 0.6 mg/dL (ref 0.3–1.2)
BUN: 33 mg/dL — AB (ref 6–20)
CALCIUM: 8.2 mg/dL — AB (ref 8.9–10.3)
CO2: 27 mmol/L (ref 22–32)
Chloride: 99 mmol/L — ABNORMAL LOW (ref 101–111)
Creatinine, Ser: 6.47 mg/dL — ABNORMAL HIGH (ref 0.44–1.00)
GFR calc Af Amer: 8 mL/min — ABNORMAL LOW (ref >60)
GFR, EST NON AFRICAN AMERICAN: 7 mL/min — AB (ref >60)
GLUCOSE: 125 mg/dL — AB (ref 65–99)
POTASSIUM: 4.2 mmol/L (ref 3.5–5.1)
Sodium: 135 mmol/L (ref 135–145)
TOTAL PROTEIN: 5.8 g/dL — AB (ref 6.5–8.1)

## 2016-02-04 LAB — CBC WITH DIFFERENTIAL/PLATELET
BASOS ABS: 0 10*3/uL (ref 0.0–0.1)
BASOS PCT: 0 %
EOS ABS: 0.5 10*3/uL (ref 0.0–0.7)
Eosinophils Relative: 6 %
HCT: 23.5 % — ABNORMAL LOW (ref 36.0–46.0)
Hemoglobin: 7.4 g/dL — ABNORMAL LOW (ref 12.0–15.0)
Lymphocytes Relative: 17 %
Lymphs Abs: 1.4 10*3/uL (ref 0.7–4.0)
MCH: 29 pg (ref 26.0–34.0)
MCHC: 31.5 g/dL (ref 30.0–36.0)
MCV: 92.2 fL (ref 78.0–100.0)
MONO ABS: 0.7 10*3/uL (ref 0.1–1.0)
MONOS PCT: 8 %
NEUTROS ABS: 5.6 10*3/uL (ref 1.7–7.7)
Neutrophils Relative %: 69 %
PLATELETS: 201 10*3/uL (ref 150–400)
RBC: 2.55 MIL/uL — ABNORMAL LOW (ref 3.87–5.11)
RDW: 16.9 % — AB (ref 11.5–15.5)
WBC: 8.2 10*3/uL (ref 4.0–10.5)

## 2016-02-04 LAB — ECHOCARDIOGRAM COMPLETE
HEIGHTINCHES: 61 in
WEIGHTICAEL: 2617.3 [oz_av]

## 2016-02-04 LAB — PARATHYROID HORMONE, INTACT (NO CA): PTH: 195 pg/mL — AB (ref 15–65)

## 2016-02-04 LAB — LIPID PANEL
CHOL/HDL RATIO: 2.9 ratio
CHOLESTEROL: 69 mg/dL (ref 0–200)
HDL: 24 mg/dL — ABNORMAL LOW (ref >40)
LDL Cholesterol: 11 mg/dL (ref 0–99)
TRIGLYCERIDES: 170 mg/dL — AB (ref <150)
VLDL: 34 mg/dL (ref 0–40)

## 2016-02-04 LAB — GLUCOSE, CAPILLARY
GLUCOSE-CAPILLARY: 116 mg/dL — AB (ref 65–99)
GLUCOSE-CAPILLARY: 146 mg/dL — AB (ref 65–99)
Glucose-Capillary: 105 mg/dL — ABNORMAL HIGH (ref 65–99)
Glucose-Capillary: 200 mg/dL — ABNORMAL HIGH (ref 65–99)
Glucose-Capillary: 139 mg/dL — ABNORMAL HIGH (ref 65–99)

## 2016-02-04 LAB — MAGNESIUM: MAGNESIUM: 2.1 mg/dL (ref 1.7–2.4)

## 2016-02-04 MED ORDER — POLYETHYLENE GLYCOL 3350 17 G PO PACK
17.0000 g | PACK | Freq: Two times a day (BID) | ORAL | Status: DC
  Administered 2016-02-04 – 2016-02-06 (×4): 17 g via ORAL
  Filled 2016-02-04 (×4): qty 1

## 2016-02-04 MED ORDER — LIDOCAINE HCL (PF) 1 % IJ SOLN: 5.0000 mL | INTRAMUSCULAR | Status: DC | PRN

## 2016-02-04 MED ORDER — SODIUM CHLORIDE 0.9 % IV SOLN: 100.0000 mL | INTRAVENOUS | Status: DC | PRN

## 2016-02-04 MED ORDER — PENTAFLUOROPROP-TETRAFLUOROETH EX AERO: 1.0000 "application " | INHALATION_SPRAY | CUTANEOUS | Status: DC | PRN

## 2016-02-04 MED ORDER — ISOSORBIDE DINITRATE 10 MG PO TABS
5.0000 mg | ORAL_TABLET | Freq: Two times a day (BID) | ORAL | Status: DC
  Administered 2016-02-04 – 2016-02-06 (×5): 5 mg via ORAL
  Filled 2016-02-04 (×6): qty 1

## 2016-02-04 MED ORDER — HEPARIN SODIUM (PORCINE) 1000 UNIT/ML DIALYSIS: 1600.0000 [IU] | INTRAMUSCULAR | Status: DC | PRN

## 2016-02-04 MED ORDER — HYDRALAZINE HCL 10 MG PO TABS: 10.0000 mg | ORAL_TABLET | Freq: Three times a day (TID) | ORAL | Status: DC

## 2016-02-04 MED ORDER — INSULIN ASPART 100 UNIT/ML ~~LOC~~ SOLN
0.0000 [IU] | Freq: Three times a day (TID) | SUBCUTANEOUS | Status: DC
  Administered 2016-02-04 – 2016-02-05 (×3): 3 [IU] via SUBCUTANEOUS
  Administered 2016-02-05: 2 [IU] via SUBCUTANEOUS

## 2016-02-04 MED ORDER — LIDOCAINE-PRILOCAINE 2.5-2.5 % EX CREA: 1.0000 "application " | TOPICAL_CREAM | CUTANEOUS | Status: DC | PRN

## 2016-02-04 MED ORDER — HEPARIN SODIUM (PORCINE) 1000 UNIT/ML DIALYSIS: 1000.0000 [IU] | INTRAMUSCULAR | Status: DC | PRN

## 2016-02-04 MED ORDER — ISOSORBIDE DINITRATE ER 40 MG PO TBCR: 40.0000 mg | EXTENDED_RELEASE_TABLET | Freq: Two times a day (BID) | ORAL | Status: DC

## 2016-02-04 MED ORDER — ALTEPLASE 2 MG IJ SOLR: 2.0000 mg | Freq: Once | INTRAMUSCULAR | Status: DC | PRN

## 2016-02-04 NOTE — Progress Notes (Signed)
[place] TEAM 1 - Stepdown/ICU TEAM PROGRESS NOTE  Isabel Sharp GBT:[id number] DOB: 24-Nov-1970 DOA: 01/30/2016 PCP: Joseph Art, MD  Admit HPI / Brief Narrative: 45 year old female with history of end-stage renal disease and hypertension who was admitted to the surgical service 01/30/16 with acute appendicitis and underwent laparoscopic appendicectomy and laparoscopic LOA on 4/29. She was initially doing well post-op w/ discussions about her being discharged when she spiked a fever associated w/ decreased mentation.  TR[facility] saw her in consultation, and subsequently assumed the Attending role.  HPI/Subjective: The pt is beginning to feel much better today.  She denies cp, n/v, or sob.  She c/o some mild abdom pain near the site of her surgery.  She was able to tolerate her diet today w/o difficulty.    Assessment/Plan:  FUO No clear etiology at present - CT abdomen with anticipated postoperative appearance but no acute changes - patient does have an indwelling hemodialysis catheter so must keep bacteremia in the differential but blood cultures are presently no growth (on abx) - temp has not entirely normalized, but does appear to be trending down   Acute encephalopathy Appears to have been primarily related to hypoglycemia - alert and oriented - CT head unremarkable  Acute appendicitis status post laparoscopic appendectomy  Ongoing care per General Surgery - appears stable from this standpoint  Demand ischemia - Hx of CAD Cardiology following - awaiting TTE results to r/o WMA - no chest pain  ESRD on M/W/F HD  Nephrology following - tolerating HD w/o difficulty   Anemia of CKD  With history of coronary artery disease goal is to keep hemoglobin 8.0 or greater - transfused one unit 5/2 with dialysis   Recent Labs Lab 02/01/16 0904 02/01/16 1656 02/02/16 0023 02/03/16 0335 02/04/16 0411  HGB 7.6* 7.4* 7.0* 8.3* 7.4*    HTN Remains poorly controlled - adjust treatment again  today and follow  DM w/ hypoglycemia  CBGs stable - follow without change  Code Status: FULL Family Communication: no family present at time of exam Disposition Plan: transfer to renal tele bed - begin to ambulate - PT consult - follow fever curve    Consultants: Gen Surgery  Cardiology  Nephrology   Procedures: 4/29 laparoscopic appendectomy, laparoscopic lysis of adhesions 5/1 TTE - pending   Antibiotics: Cefepime 4/28 > Flagyl 4/28 >  DVT prophylaxis: SQ heparin   Objective: Blood pressure 170/65, pulse 59, temperature 98.8 F (37.1 C), temperature source Oral, resp. rate 23, height $RemoveBe'5\' 1"'IBmpYhtWv$  (1.549 m), weight 77.8 kg (171 lb 8.3 oz), SpO2 100 %.  Intake/Output Summary (Last 24 hours) at 02/04/16 0922 Last data filed at 02/04/16 0600  Gross per 24 hour  Intake    510 ml  Output    100 ml  Net    410 ml    Exam: General: No acute respiratory distress - alert and conversant  Lungs: Clear to auscultation bilaterally without wheezes  Cardiovascular: Regular rate and rhythm without murmur gallop or rub normal S1 and S2 Abdomen: mildly tender to palpation in RLQ, soft, bowel sounds positive, no rebound Extremities: No significant cyanosis, clubbing, or edema bilateral lower extremities  Data Reviewed:  Basic Metabolic Panel:  Recent Labs Lab 01/31/16 1000 02/01/16 0501 02/01/16 0904 02/01/16 1656 02/02/16 0023 02/03/16 0420 02/04/16 0411  NA 141 138 138 137 134*  134* 135 135  K 4.5 4.4 4.4 4.4 4.6  4.6 3.9 4.2  CL 106 103 102 102 101  99* 98* 99*  CO2 19* $Remov'25 23 23 23  22 26 27  'xBBQqB$ GLUCOSE 124* 54* 119* 76 147*  151* 164* 125*  BUN 13 29* 30* 32* 35*  35* 21* 33*  CREATININE 4.70* 7.17* 7.58* 7.94* 8.21*  8.21* 4.99* 6.47*  CALCIUM 8.1* 8.1* 8.1* 8.2* 8.1*  8.0* 8.0* 8.2*  MG  --   --   --   --   --   --  2.1  PHOS 3.5 4.3  --  4.7* 4.6 4.3  --     CBC:  Recent Labs Lab 01/30/16 1658  02/01/16 0904 02/01/16 1656 02/02/16 0023 02/03/16 0335  02/04/16 0411  WBC 13.4*  < > 12.0* 11.4* 11.9* 9.5 8.2  NEUTROABS 10.8*  --   --   --   --   --  5.6  HGB 9.4*  < > 7.6* 7.4* 7.0* 8.3* 7.4*  HCT 28.7*  < > 24.3* 23.7* 22.4* 25.2* 23.5*  MCV 98.6  < > 102.5* 102.2* 100.9* 93.7 92.2  PLT 259  < > 206 190 209 185 201  < > = values in this interval not displayed.  Liver Function Tests:  Recent Labs Lab 01/30/16 1658  02/01/16 0904 02/01/16 1656 02/02/16 0023 02/03/16 0335 02/03/16 0420 02/04/16 0411  AST 17  --  14*  --   --  12*  --  10*  ALT 17  --  10*  --   --  11*  --  10*  ALKPHOS 92  --  63  --   --  58  --  58  BILITOT 0.4  --  0.6  --   --  0.5  --  0.6  PROT 7.3  --  5.7*  --   --  5.7*  --  5.8*  ALBUMIN 3.6  < > 2.7* 2.5* 2.5* 2.4* 2.4* 2.5*  < > = values in this interval not displayed.  Recent Labs Lab 01/30/16 1658 02/03/16 0335  LIPASE 59* 34   Cardiac Enzymes:  Recent Labs Lab 02/01/16 0904 02/01/16 1133 02/01/16 2234  TROPONINI 1.82* 2.10* 2.44*    CBG:  Recent Labs Lab 02/03/16 1305 02/03/16 1735 02/03/16 2139 02/04/16 0054 02/04/16 0507  GLUCAP 230* 176* 162* 146* 116*    Recent Results (from the past 240 hour(s))  MRSA PCR Screening     Status: None   Collection Time: 02/01/16  4:00 PM  Result Value Ref Range Status   MRSA by PCR NEGATIVE NEGATIVE Final    Comment:        The GeneXpert MRSA Assay (FDA approved for NASAL specimens only), is one component of a comprehensive MRSA colonization surveillance program. It is not intended to diagnose MRSA infection nor to guide or monitor treatment for MRSA infections.   Culture, blood (Routine X 2) w Reflex to ID Panel     Status: None (Preliminary result)   Collection Time: 02/02/16  5:38 PM  Result Value Ref Range Status   Specimen Description BLOOD RIGHT HAND  Final   Special Requests BOTTLES DRAWN AEROBIC AND ANAEROBIC 5ML  Final   Culture NO GROWTH < 24 HOURS  Final   Report Status PENDING  Incomplete  Culture, blood  (Routine X 2) w Reflex to ID Panel     Status: None (Preliminary result)   Collection Time: 02/02/16  5:43 PM  Result Value Ref Range Status   Specimen Description BLOOD RIGHT HAND  Final   Special Requests IN PEDIATRIC BOTTLE 3ML  Final  Culture NO GROWTH < 24 HOURS  Final   Report Status PENDING  Incomplete     Studies:   Recent x-ray studies have been reviewed in detail by the Attending Physician  Scheduled Meds:  Scheduled Meds: . bumetanide  2 mg Oral Daily  . carvedilol  25 mg Oral BID WC  . ceFEPime (MAXIPIME) IV  2 g Intravenous Q M,W,F-HD   And  . metronidazole  500 mg Intravenous Q8H  . diltiazem  300 mg Oral BID  . docusate sodium  100 mg Oral BID  . Ferric Citrate  210 mg Oral TID WC  . heparin subcutaneous  5,000 Units Subcutaneous Q8H  . hydrALAZINE  100 mg Oral Q8H  . insulin aspart  0-15 Units Subcutaneous Q4H  . insulin aspart  0-5 Units Subcutaneous QHS  . losartan  100 mg Oral Daily  . omega-3 acid ethyl esters  1,000 mg Oral Daily  . polyethylene glycol  17 g Oral Daily  . pravastatin  40 mg Oral QHS  . sevelamer carbonate  2,400 mg Oral TID WC  . sodium bicarbonate  650 mg Oral QHS    Time spent on care of this patient: 35 mins   MCCLUNG,JEFFREY T , MD   [facility] Office  [phone] Pager - Text Page per Shea Evans as per below:  On-Call/Text Page:      [email or web address]      password [email or web address]  If 7PM-7AM, please contact night-coverage [email or web address] Password [email or web address] 02/04/2016, 9:22 AM   LOS: 3 days

## 2016-02-04 NOTE — Progress Notes (Signed)
Patient ID: Isabel Sharp, female   DOB: 01/09/1971, 45 y.o.   MRN: [id number]     [facility]      [address]   [place], [place] [address]    Phone: [phone] FAX: [phone]     Subjective: bm yesterday.  Tolerating pos, but not much appetite.  No vomiting. Afebrile. WBC normal.   Objective:  Vital signs:  Filed Vitals:   02/04/16 0749 02/04/16 0800 02/04/16 0830 02/04/16 0859  BP: 167/65 151/63 160/64 170/65  Pulse: 60 61 62 59  Temp:      TempSrc:      Resp: 24 24 27 23  Height:      Weight:      SpO2:        Last BM Date: 01/30/16  Intake/Output   Yesterday:  05/02 0701 - 05/03 0700 In: 540 [I.V.:240; IV Piggyback:300] Out: 100 [Emesis/NG output:100] This shift:    I/O last 3 completed shifts: In: 1949.3 [P.O.:240; I.V.:1059.3; IV Piggyback:650] Out: 300 [Urine:200; Emesis/NG output:100]    Physical Exam: General: Pt awake/alert/oriented x4 in no acute distress  Abdomen: Soft.  Nondistended.  Mildly tender at incisions only.  No evidence of peritonitis.  No incarcerated hernias.    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)   CAD (coronary artery disease)   Elevated troponin   Acute appendicitis   SIRS (systemic inflammatory response syndrome) (HCC)   Fever   Altered mental status   Uncontrolled type 2 diabetes mellitus with complication (HCC)   Elevated blood pressure   FUO (fever of unknown origin)    Results:   Labs: Results for orders placed or performed during the hospital encounter of 01/30/16 (from the past 48 hour(s))  Glucose, capillary     Status: Abnormal   Collection Time: 02/02/16 12:17 PM  Result Value Ref Range   Glucose-Capillary 109 (H) 65 - 99 mg/dL  Glucose, capillary     Status: Abnormal   Collection Time: 02/02/16  1:04 PM  Result  Value Ref Range   Glucose-Capillary 119 (H) 65 - 99 mg/dL  Hepatitis B surface antigen     Status: None   Collection Time: 02/02/16  1:15 PM  Result Value Ref Range   Hepatitis B Surface Ag Negative Negative    Comment: (NOTE) Performed At: [facility] [address] [place], [place] [id number] Hancock William F MD Ph:[phone]   Glucose, capillary     Status: Abnormal   Collection Time: 02/02/16  3:49 PM  Result Value Ref Range   Glucose-Capillary 190 (H) 65 - 99 mg/dL  Culture, blood (Routine X 2) w Reflex to ID Panel     Status: None (Preliminary result)   Collection Time: 02/02/16  5:38 PM  Result Value Ref Range   Specimen Description BLOOD RIGHT HAND    Special Requests BOTTLES DRAWN AEROBIC AND ANAEROBIC 5ML    Culture NO GROWTH < 24 HOURS    Report Status PENDING   Culture, blood (Routine X 2) w Reflex to ID Panel     Status: None (Preliminary result)   Collection Time: 02/02/16  5:43 PM  Result Value Ref Range   Specimen Description BLOOD RIGHT HAND    Special Requests IN PEDIATRIC BOTTLE 3ML    Culture NO GROWTH < 24 HOURS    Report   Status PENDING   Glucose, capillary     Status: Abnormal   Collection Time: 02/02/16 10:06 PM  Result Value Ref Range   Glucose-Capillary 151 (H) 65 - 99 mg/dL  CBC     Status: Abnormal   Collection Time: 02/03/16  3:35 AM  Result Value Ref Range   WBC 9.5 4.0 - 10.5 K/uL   RBC 2.69 (L) 3.87 - 5.11 MIL/uL   Hemoglobin 8.3 (L) 12.0 - 15.0 g/dL   HCT 25.2 (L) 36.0 - 46.0 %   MCV 93.7 78.0 - 100.0 fL    Comment: DELTA CHECK NOTED REPEATED TO VERIFY POST TRANSFUSION SPECIMEN    MCH 30.9 26.0 - 34.0 pg   MCHC 32.9 30.0 - 36.0 g/dL   RDW 17.9 (H) 11.5 - 15.5 %   Platelets 185 150 - 400 K/uL  Hepatic function panel     Status: Abnormal   Collection Time: 02/03/16  3:35 AM  Result Value Ref Range   Total Protein 5.7 (L) 6.5 - 8.1 g/dL   Albumin 2.4 (L) 3.5 - 5.0 g/dL   AST 12 (L) 15 - 41 U/L   ALT 11 (L) 14 - 54 U/L    Alkaline Phosphatase 58 38 - 126 U/L   Total Bilirubin 0.5 0.3 - 1.2 mg/dL   Bilirubin, Direct 0.2 0.1 - 0.5 mg/dL   Indirect Bilirubin 0.3 0.3 - 0.9 mg/dL  Lipase, blood     Status: None   Collection Time: 02/03/16  3:35 AM  Result Value Ref Range   Lipase 34 11 - 51 U/L  Parathyroid hormone, intact (no Ca)     Status: Abnormal   Collection Time: 02/03/16  3:35 AM  Result Value Ref Range   PTH 195 (H) 15 - 65 pg/mL    Comment: (NOTE) Performed At: [facility] [address] [place], [place] [id number] Hancock William F MD Ph:[phone]   Renal function panel     Status: Abnormal   Collection Time: 02/03/16  4:20 AM  Result Value Ref Range   Sodium 135 135 - 145 mmol/L   Potassium 3.9 3.5 - 5.1 mmol/L   Chloride 98 (L) 101 - 111 mmol/L   CO2 26 22 - 32 mmol/L   Glucose, Bld 164 (H) 65 - 99 mg/dL   BUN 21 (H) 6 - 20 mg/dL   Creatinine, Ser 4.99 (H) 0.44 - 1.00 mg/dL    Comment: DELTA CHECK NOTED   Calcium 8.0 (L) 8.9 - 10.3 mg/dL   Phosphorus 4.3 2.5 - 4.6 mg/dL   Albumin 2.4 (L) 3.5 - 5.0 g/dL   GFR calc non Af Amer 9 (L) >60 mL/min   GFR calc Af Amer 11 (L) >60 mL/min    Comment: (NOTE) The eGFR has been calculated using the CKD EPI equation. This calculation has not been validated in all clinical situations. eGFR's persistently <60 mL/min signify possible Chronic Kidney Disease.    Anion gap 11 5 - 15  Glucose, capillary     Status: Abnormal   Collection Time: 02/03/16  8:22 AM  Result Value Ref Range   Glucose-Capillary 156 (H) 65 - 99 mg/dL  Glucose, capillary     Status: Abnormal   Collection Time: 02/03/16  1:05 PM  Result Value Ref Range   Glucose-Capillary 230 (H) 65 - 99 mg/dL  Glucose, capillary     Status: Abnormal   Collection Time: 02/03/16  5:35 PM  Result Value Ref Range   Glucose-Capillary 176 (H) 65 -   99 mg/dL  Glucose, capillary     Status: Abnormal   Collection Time: 02/03/16  9:39 PM  Result Value Ref Range   Glucose-Capillary 162 (H)  65 - 99 mg/dL  Glucose, capillary     Status: Abnormal   Collection Time: 02/04/16 12:54 AM  Result Value Ref Range   Glucose-Capillary 146 (H) 65 - 99 mg/dL  Lipid panel     Status: Abnormal   Collection Time: 02/04/16  4:11 AM  Result Value Ref Range   Cholesterol 69 0 - 200 mg/dL   Triglycerides 170 (H) <150 mg/dL   HDL 24 (L) >40 mg/dL   Total CHOL/HDL Ratio 2.9 RATIO   VLDL 34 0 - 40 mg/dL   LDL Cholesterol 11 0 - 99 mg/dL    Comment:        Total Cholesterol/HDL:CHD Risk Coronary Heart Disease Risk Table                     Men   Women  1/2 Average Risk   3.4   3.3  Average Risk       5.0   4.4  2 X Average Risk   9.6   7.1  3 X Average Risk  23.4   11.0        Use the calculated Patient Ratio above and the CHD Risk Table to determine the patient's CHD Risk.        ATP III CLASSIFICATION (LDL):  <100     mg/dL   Optimal  100-129  mg/dL   Near or Above                    Optimal  130-159  mg/dL   Borderline  160-189  mg/dL   High  >190     mg/dL   Very High   Comprehensive metabolic panel     Status: Abnormal   Collection Time: 02/04/16  4:11 AM  Result Value Ref Range   Sodium 135 135 - 145 mmol/L   Potassium 4.2 3.5 - 5.1 mmol/L   Chloride 99 (L) 101 - 111 mmol/L   CO2 27 22 - 32 mmol/L   Glucose, Bld 125 (H) 65 - 99 mg/dL   BUN 33 (H) 6 - 20 mg/dL   Creatinine, Ser 6.47 (H) 0.44 - 1.00 mg/dL   Calcium 8.2 (L) 8.9 - 10.3 mg/dL   Total Protein 5.8 (L) 6.5 - 8.1 g/dL   Albumin 2.5 (L) 3.5 - 5.0 g/dL   AST 10 (L) 15 - 41 U/L   ALT 10 (L) 14 - 54 U/L   Alkaline Phosphatase 58 38 - 126 U/L   Total Bilirubin 0.6 0.3 - 1.2 mg/dL   GFR calc non Af Amer 7 (L) >60 mL/min   GFR calc Af Amer 8 (L) >60 mL/min    Comment: (NOTE) The eGFR has been calculated using the CKD EPI equation. This calculation has not been validated in all clinical situations. eGFR's persistently <60 mL/min signify possible Chronic Kidney Disease.    Anion gap 9 5 - 15  Magnesium      Status: None   Collection Time: 02/04/16  4:11 AM  Result Value Ref Range   Magnesium 2.1 1.7 - 2.4 mg/dL  CBC with Differential/Platelet     Status: Abnormal   Collection Time: 02/04/16  4:11 AM  Result Value Ref Range   WBC 8.2 4.0 - 10.5 K/uL   RBC 2.55 (L) 3.87 -  5.11 MIL/uL   Hemoglobin 7.4 (L) 12.0 - 15.0 g/dL   HCT 23.5 (L) 36.0 - 46.0 %   MCV 92.2 78.0 - 100.0 fL   MCH 29.0 26.0 - 34.0 pg   MCHC 31.5 30.0 - 36.0 g/dL   RDW 16.9 (H) 11.5 - 15.5 %   Platelets 201 150 - 400 K/uL   Neutrophils Relative % 69 %   Neutro Abs 5.6 1.7 - 7.7 K/uL   Lymphocytes Relative 17 %   Lymphs Abs 1.4 0.7 - 4.0 K/uL   Monocytes Relative 8 %   Monocytes Absolute 0.7 0.1 - 1.0 K/uL   Eosinophils Relative 6 %   Eosinophils Absolute 0.5 0.0 - 0.7 K/uL   Basophils Relative 0 %   Basophils Absolute 0.0 0.0 - 0.1 K/uL  Glucose, capillary     Status: Abnormal   Collection Time: 02/04/16  5:07 AM  Result Value Ref Range   Glucose-Capillary 116 (H) 65 - 99 mg/dL    Imaging / Studies: No results found.  Medications / Allergies:  Scheduled Meds: . bumetanide  2 mg Oral Daily  . carvedilol  25 mg Oral BID WC  . ceFEPime (MAXIPIME) IV  2 g Intravenous Q M,W,F-HD   And  . metronidazole  500 mg Intravenous Q8H  . diltiazem  300 mg Oral BID  . docusate sodium  100 mg Oral BID  . Ferric Citrate  210 mg Oral TID WC  . heparin subcutaneous  5,000 Units Subcutaneous Q8H  . hydrALAZINE  100 mg Oral Q8H  . insulin aspart  0-15 Units Subcutaneous Q4H  . insulin aspart  0-5 Units Subcutaneous QHS  . losartan  100 mg Oral Daily  . omega-3 acid ethyl esters  1,000 mg Oral Daily  . polyethylene glycol  17 g Oral Daily  . pravastatin  40 mg Oral QHS  . sevelamer carbonate  2,400 mg Oral TID WC  . sodium bicarbonate  650 mg Oral QHS   Continuous Infusions: . dextrose 5 % and 0.45% NaCl 10 mL/hr at 02/02/16 1604   PRN Meds:.sodium chloride, sodium chloride, acetaminophen, alteplase, heparin, [START ON  02/05/2016] heparin, lidocaine (PF), lidocaine-prilocaine, morphine injection, naLOXone (NARCAN)  injection, ondansetron **OR** ondansetron (ZOFRAN) IV, oxyCODONE, pentafluoroprop-tetrafluoroeth  Antibiotics: Anti-infectives    Start     Dose/Rate Route Frequency Ordered Stop   01/31/16 0300  ceFEPIme (MAXIPIME) 2 g in dextrose 5 % 50 mL IVPB     2 g 100 mL/hr over 30 Minutes Intravenous Every M-W-F (Hemodialysis) 01/31/16 0156     01/31/16 0300  metroNIDAZOLE (FLAGYL) IVPB 500 mg     500 mg 100 mL/hr over 60 Minutes Intravenous Every 8 hours 01/31/16 0156     01/30/16 2215  cefTRIAXone (ROCEPHIN) 1 g in dextrose 5 % 50 mL IVPB  Status:  Discontinued    Comments:  On call for OR   1 g 100 mL/hr over 30 Minutes Intravenous  Once 01/30/16 2211 02/01/16 2111   01/30/16 2215  metroNIDAZOLE (FLAGYL) IVPB 500 mg  Status:  Discontinued    Comments:  On call for OR   500 mg 100 mL/hr over 60 Minutes Intravenous  Once 01/30/16 2211 02/02/16 1600   01/30/16 1645  ciprofloxacin (CIPRO) IVPB 400 mg     400 mg 200 mL/hr over 60 Minutes Intravenous  Once 01/30/16 1641 01/30/16 1745   01/30/16 1645  metroNIDAZOLE (FLAGYL) IVPB 500 mg     500 mg 100 mL/hr over 60 Minutes Intravenous    Once 01/30/16 1641 01/30/16 1906        Assessment/Plan POD#5 lap appy--Dr. Kinsinger  -tolerating POs, afebrile, pain controlled.  Surgically stable.  Signing off, please call for further assistance.  Emina Riebock, ANP-BC [facility] Pager [phone](7A-4:30P) For consults and floor pages call [phone](7A-4:30P)  02/04/2016 9:39 AM    

## 2016-02-04 NOTE — Progress Notes (Signed)
  Echocardiogram 2D Echocardiogram has been performed.  Diamond Nickel 02/04/2016, 3:52 PM

## 2016-02-04 NOTE — Progress Notes (Signed)
[facility] Progress Note   Subjective: afeb x 24 hours now, no chills/ sweats.  Vomiting resolved.   Filed Vitals:   02/04/16 0959 02/04/16 1030 02/04/16 1100 02/04/16 1125  BP: 184/64 182/62 179/68 185/71  Pulse: 63 65 66 69  Temp:    98 F (36.7 C)  TempSrc:    Oral  Resp: $Remo'22 21 23 27  'zTzjK$ Height:      Weight:    74.2 kg (163 lb 9.3 oz)  SpO2: 100% 100% 100% 100%    Inpatient medications: . bumetanide  2 mg Oral Daily  . carvedilol  25 mg Oral BID WC  . ceFEPime (MAXIPIME) IV  2 g Intravenous Q M,W,F-HD   And  . metronidazole  500 mg Intravenous Q8H  . diltiazem  300 mg Oral BID  . docusate sodium  100 mg Oral BID  . Ferric Citrate  210 mg Oral TID WC  . heparin subcutaneous  5,000 Units Subcutaneous Q8H  . hydrALAZINE  100 mg Oral Q8H  . insulin aspart  0-15 Units Subcutaneous Q4H  . insulin aspart  0-5 Units Subcutaneous QHS  . losartan  100 mg Oral Daily  . omega-3 acid ethyl esters  1,000 mg Oral Daily  . polyethylene glycol  17 g Oral Daily  . pravastatin  40 mg Oral QHS  . sevelamer carbonate  2,400 mg Oral TID WC  . sodium bicarbonate  650 mg Oral QHS   . dextrose 5 % and 0.45% NaCl 10 mL/hr at 02/02/16 1604   sodium chloride, sodium chloride, acetaminophen, alteplase, heparin, [START ON 02/05/2016] heparin, lidocaine (PF), lidocaine-prilocaine, morphine injection, naLOXone (NARCAN)  injection, ondansetron **OR** ondansetron (ZOFRAN) IV, oxyCODONE, pentafluoroprop-tetrafluoroeth  Exam: Alert no distress, calm No jvd Chest clear bilat RRR no mrg Abd obese soft mildly tender SP area +bs No wounds or ulcers, no LE edema LUA AVF +bruit, and IJ cath  Dialysis: [facility] [place]  4h  165 lbs  Using HD cath now d/t infiltration of AVF      Assessment: 1 S/P appendectomy  2 Fevers - post op, resolved now.  Blood cx's neg.  3 ESRD on HD MWF. Using cath not AVF d/t infiltration 4 Anemia Hb 7-8.5 range 5 Dispo - stable from renal  standpoint  Plan - HD today, keep even   Isabel Splinter MD [facility] pager [phone]    cell [phone] 02/04/2016, 11:38 AM    Recent Labs Lab 02/01/16 1656 02/02/16 0023 02/03/16 0420 02/04/16 0411  NA 137 134*  134* 135 135  K 4.4 4.6  4.6 3.9 4.2  CL 102 101  99* 98* 99*  CO2 $Re'23 23  22 26 27  'dvM$ GLUCOSE 76 147*  151* 164* 125*  BUN 32* 35*  35* 21* 33*  CREATININE 7.94* 8.21*  8.21* 4.99* 6.47*  CALCIUM 8.2* 8.1*  8.0* 8.0* 8.2*  PHOS 4.7* 4.6 4.3  --     Recent Labs Lab 02/01/16 0904  02/03/16 0335 02/03/16 0420 02/04/16 0411  AST 14*  --  12*  --  10*  ALT 10*  --  11*  --  10*  ALKPHOS 63  --  58  --  58  BILITOT 0.6  --  0.5  --  0.6  PROT 5.7*  --  5.7*  --  5.8*  ALBUMIN 2.7*  < > 2.4* 2.4* 2.5*  < > = values in this interval not displayed.  Recent Labs Lab 01/30/16 1658  02/02/16 0023 02/03/16 0335 02/04/16 0411  WBC 13.4*  < > 11.9* 9.5 8.2  NEUTROABS 10.8*  --   --   --  5.6  HGB 9.4*  < > 7.0* 8.3* 7.4*  HCT 28.7*  < > 22.4* 25.2* 23.5*  MCV 98.6  < > 100.9* 93.7 92.2  PLT 259  < > 209 185 201  < > = values in this interval not displayed.

## 2016-02-04 NOTE — Progress Notes (Signed)
Report attempted.  Nurse to call back.

## 2016-02-05 LAB — CBC
HEMATOCRIT: 24.5 % — AB (ref 36.0–46.0)
Hemoglobin: 7.6 g/dL — ABNORMAL LOW (ref 12.0–15.0)
MCH: 29.5 pg (ref 26.0–34.0)
MCHC: 31 g/dL (ref 30.0–36.0)
MCV: 95 fL (ref 78.0–100.0)
PLATELETS: 208 10*3/uL (ref 150–400)
RBC: 2.58 MIL/uL — ABNORMAL LOW (ref 3.87–5.11)
RDW: 16.6 % — AB (ref 11.5–15.5)
WBC: 7.6 10*3/uL (ref 4.0–10.5)

## 2016-02-05 LAB — GLUCOSE, CAPILLARY
GLUCOSE-CAPILLARY: 186 mg/dL — AB (ref 65–99)
GLUCOSE-CAPILLARY: 114 mg/dL — AB (ref 65–99)
Glucose-Capillary: 130 mg/dL — ABNORMAL HIGH (ref 65–99)
Glucose-Capillary: 159 mg/dL — ABNORMAL HIGH (ref 65–99)

## 2016-02-05 LAB — HEMOGLOBIN A1C
Hgb A1c MFr Bld: 6.8 % — ABNORMAL HIGH (ref 4.8–5.6)
MEAN PLASMA GLUCOSE: 148 mg/dL

## 2016-02-05 MED ORDER — METRONIDAZOLE 500 MG PO TABS
500.0000 mg | ORAL_TABLET | Freq: Three times a day (TID) | ORAL | Status: DC
  Administered 2016-02-05 – 2016-02-06 (×4): 500 mg via ORAL
  Filled 2016-02-05 (×4): qty 1

## 2016-02-05 MED ORDER — CIPROFLOXACIN HCL 250 MG PO TABS
250.0000 mg | ORAL_TABLET | Freq: Every day | ORAL | Status: DC
  Administered 2016-02-05 – 2016-02-06 (×2): 250 mg via ORAL
  Filled 2016-02-05 (×3): qty 1

## 2016-02-05 NOTE — Progress Notes (Signed)
[facility] Progress Note   Subjective: afeb x 24 hours now, no chills/ sweats.  Vomiting resolved.   Filed Vitals:   02/04/16 2221 02/04/16 2339 02/05/16 0452 02/05/16 1000  BP:  157/60 158/60 168/61  Pulse:  59 60 63  Temp:   98.7 F (37.1 C) 98.2 F (36.8 C)  TempSrc:   Oral Oral  Resp:   16 18  Height:      Weight: 73.5 kg (162 lb 0.6 oz)     SpO2:   97% 98%    Inpatient medications: . bumetanide  2 mg Oral Daily  . carvedilol  25 mg Oral BID WC  . ceFEPime (MAXIPIME) IV  2 g Intravenous Q M,W,F-HD   And  . metronidazole  500 mg Intravenous Q8H  . diltiazem  300 mg Oral BID  . docusate sodium  100 mg Oral BID  . Ferric Citrate  210 mg Oral TID WC  . heparin subcutaneous  5,000 Units Subcutaneous Q8H  . hydrALAZINE  100 mg Oral Q8H  . insulin aspart  0-15 Units Subcutaneous TID WC  . insulin aspart  0-5 Units Subcutaneous QHS  . isosorbide dinitrate  5 mg Oral BID  . losartan  100 mg Oral Daily  . omega-3 acid ethyl esters  1,000 mg Oral Daily  . polyethylene glycol  17 g Oral BID  . pravastatin  40 mg Oral QHS  . sevelamer carbonate  2,400 mg Oral TID WC  . sodium bicarbonate  650 mg Oral QHS     acetaminophen, morphine injection, naLOXone (NARCAN)  injection, ondansetron **OR** ondansetron (ZOFRAN) IV, oxyCODONE  Exam: Alert no distress, calm No jvd Chest clear bilat RRR no mrg Abd obese soft mildly tender SP area +bs No wounds or ulcers, no LE edema LUA AVF +bruit, and IJ cath  Dialysis: [facility] [place]  4h  165 lbs  Using HD cath now d/t infiltration of AVF      Assessment: 1 S/P appendectomy  2 Fevers - post op, resolved now.  Blood cx's neg.  3 ESRD on HD MWF. Using cath not AVF d/t infiltration 4 Anemia Hb 7-8.5 range 5 Dispo - stable from renal standpoint  Plan - HD in am Sat   Kelly Splinter MD [facility] pager [phone]    cell [phone] 02/05/2016, 12:13 PM    Recent Labs Lab 02/01/16 1656  02/02/16 0023 02/03/16 0420 02/04/16 0411  NA 137 134*  134* 135 135  K 4.4 4.6  4.6 3.9 4.2  CL 102 101  99* 98* 99*  CO2 $Re'23 23  22 26 27  'xhP$ GLUCOSE 76 147*  151* 164* 125*  BUN 32* 35*  35* 21* 33*  CREATININE 7.94* 8.21*  8.21* 4.99* 6.47*  CALCIUM 8.2* 8.1*  8.0* 8.0* 8.2*  PHOS 4.7* 4.6 4.3  --     Recent Labs Lab 02/01/16 0904  02/03/16 0335 02/03/16 0420 02/04/16 0411  AST 14*  --  12*  --  10*  ALT 10*  --  11*  --  10*  ALKPHOS 63  --  58  --  58  BILITOT 0.6  --  0.5  --  0.6  PROT 5.7*  --  5.7*  --  5.8*  ALBUMIN 2.7*  < > 2.4* 2.4* 2.5*  < > = values in this interval not displayed.  Recent Labs Lab 01/30/16 1658  02/03/16 0335 02/04/16 0411 02/05/16 0504  WBC 13.4*  < > 9.5  8.2 7.6  NEUTROABS 10.8*  --   --  5.6  --   HGB 9.4*  < > 8.3* 7.4* 7.6*  HCT 28.7*  < > 25.2* 23.5* 24.5*  MCV 98.6  < > 93.7 92.2 95.0  PLT 259  < > 185 201 208  < > = values in this interval not displayed.

## 2016-02-05 NOTE — Care Management Note (Signed)
Case Management Note  Patient Details  Name: Isabel Sharp MRN: [id number] Date of Birth: 1971/08/04  Subjective/Objective:    Acute appendicitis status post laparoscopic appendectomy                 Action/Plan: Discharge Planning:  11:00 am NCM spoke to pt at bedside. States she feels she may need some PT at home. Contacted attending for PT eval. Waiting PT recommendations for home.  Pt reports she lives at home with husband, Isabel Sharp and mother. Her mother is at home with her during the day but her being to assist her is limited due to own medical conditions. Pt states she can afford medications.   PCP -Silver Huguenin MD  1500 Per PT recommendations, No HH PT recommended.   Expected Discharge Date:  02/05/2016              Expected Discharge Plan:  Home/Self Care  In-House Referral:  NA  Discharge planning Services  CM Consult  Post Acute Care Choice:  NA Choice offered to:  NA  DME Arranged:  N/A DME Agency:  NA  HH Arranged:  NA HH Agency:  NA  Status of Service:  Completed, signed off  Medicare Important Message Given:    Date Medicare IM Given:    Medicare IM give by:    Date Additional Medicare IM Given:    Additional Medicare Important Message give by:     If discussed at [place] of Stay Meetings, dates discussed:    Additional Comments:  Erenest Rasher, RN 02/05/2016, 3:15 PM

## 2016-02-05 NOTE — Evaluation (Signed)
Physical Therapy Evaluation Patient Details Name: Carolle Ishii MRN: [id number] DOB: 04-11-1971 Today's Date: 02/05/2016   History of Present Illness  45 year old female with history of end-stage renal disease and hypertension who was admitted to the surgical service 01/30/16 with acute appendicitis and underwent laparoscopic appendicectomy and laparoscopic LOA on 4/29. She was initially doing well post-op w/ discussions about her being discharged when she spiked a fever associated w/ decreased mentation and hypoglycemia.   PMH:  CAD, ESRD on HD, CHF, HTN, DM  Clinical Impression  Patient presents with problems listed below.  Will benefit from acute PT to maximize functional mobility prior to d/c home with family.  Do not anticipate any f/u PT needs at d/c.    Follow Up Recommendations No PT follow up;Supervision for mobility/OOB    Equipment Recommendations  Other (comment) (TBD - possibly cane)    Recommendations for Other Services       Precautions / Restrictions Precautions Precautions: Fall Restrictions Weight Bearing Restrictions: No      Mobility  Bed Mobility Overal bed mobility: Needs Assistance Bed Mobility: Supine to Sit     Supine to sit: Mod assist     General bed mobility comments: Attempted log rolling and moving sidelying to sit - too painful.  Patient required mod assist to raise trunk to sitting position.  Transfers Overall transfer level: Needs assistance Equipment used: 1 person hand held assist Transfers: Sit to/from Stand Sit to Stand: Min assist         General transfer comment: Min assist to steady during transfers.  Ambulation/Gait Ambulation/Gait assistance: Min assist Ambulation Distance (Feet): 100 Feet Assistive device: 1 person hand held assist Gait Pattern/deviations: Step-through pattern;Decreased step length - right;Decreased step length - left;Decreased stride length;Shuffle Gait velocity: decreased Gait velocity interpretation:  Below normal speed for age/gender General Gait Details: Patient with slow, guarded gait.  Unsteady at times, requiring assist to maintain balance.  Stairs            Wheelchair Mobility    Modified Rankin (Stroke Patients Only)       Balance Overall balance assessment: Needs assistance         Standing balance support: Single extremity supported Standing balance-Leahy Scale: Fair                               Pertinent Vitals/Pain Pain Assessment: 0-10 Pain Score: 5  Pain Location: Abdomen with mobility Pain Descriptors / Indicators: Aching;Sore Pain Intervention(s): Limited activity within patient's tolerance;Monitored during session;Repositioned;Patient requesting pain meds-RN notified    Home Living Family/patient expects to be discharged to:: Private residence Living Arrangements: Spouse/significant other;Children (13 yo son with special needs) Available Help at Discharge: Family;Available 24 hours/day (Mother is coming to help out) Type of Home: House Home Access: Stairs to enter Entrance Stairs-Rails: None Entrance Stairs-Number of Steps: 1 Home Layout: Two level;Able to live on main level with bedroom/bathroom Home Equipment: None      Prior Function Level of Independence: Independent         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     Hand Dominance        Extremity/Trunk Assessment   Upper Extremity Assessment: Overall WFL for tasks assessed           Lower Extremity Assessment: Generalized weakness         Communication   Communication: No difficulties (Speaks English)  Cognition Arousal/Alertness: Awake/alert Behavior During Therapy: [facility]  for tasks assessed/performed Overall Cognitive Status: Within Functional Limits for tasks assessed                      General Comments      Exercises        Assessment/Plan    PT Assessment Patient needs continued PT services  PT Diagnosis Abnormality of  gait;Generalized weakness;Acute pain   PT Problem List Decreased strength;Decreased activity tolerance;Decreased balance;Decreased mobility;Pain  PT Treatment Interventions DME instruction;Gait training;Functional mobility training;Therapeutic activities;Balance training;Patient/family education   PT Goals (Current goals can be found in the Care Plan section) Acute Rehab PT Goals Patient Stated Goal: To decrease pain PT Goal Formulation: With patient Time For Goal Achievement: 02/12/16 Potential to Achieve Goals: Good    Frequency Min 3X/week   Barriers to discharge        Co-evaluation               End of Session   Activity Tolerance: Patient limited by pain Patient left: in chair;with call bell/phone within reach;with chair alarm set Nurse Communication: Mobility status;Patient requests pain meds         Time: 4436-0165 PT Time Calculation (min) (ACUTE ONLY): 23 min   Charges:   PT Evaluation $PT Eval Moderate Complexity: 1 Procedure PT Treatments $Gait Training: 8-22 mins   PT G Codes:        Despina Pole 03-04-2016, 3:14 PM Carita Pian. Sanjuana Kava, [place] Pager [phone]

## 2016-02-05 NOTE — Progress Notes (Signed)
PROGRESS NOTE  Isabel Sharp QBH:[id number] DOB: 20-Jun-1971 DOA: 01/30/2016 PCP: Sheral Apley Out[facility]ists:    LOS: 4 days   Brief Narrative: 45 year old female with history of end-stage renal disease and hypertension who was admitted to the surgical service 01/30/16 with acute appendicitis and underwent laparoscopic appendicectomy and laparoscopic LOA on 4/29. She was initially doing well post-op w/ discussions about her being discharged when she spiked a fever associated w/ decreased mentation. [facility] saw her in consultation, and subsequently took over.   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)   CAD (coronary artery disease)   Elevated troponin   Acute appendicitis   SIRS (systemic inflammatory response syndrome) (HCC)   Fever   Altered mental status   Uncontrolled type 2 diabetes mellitus with complication (HCC)   Elevated blood pressure   FUO (fever of unknown origin)  Febrile episode 4/30 - No clear etiology at present - CT abdomen with anticipated postoperative appearance but no acute changes - patient does have an indwelling hemodialysis catheter so must keep bacteremia in the differential but blood cultures are presently no growth - afebrile now - given that fever was associated with surgery will narrow antibiotics for intraabdominal coverage with ciprofloxacin and metronidazole. Plan for empiric 10 days without a clear source  Acute encephalopathy - Appears to have been primarily related to hypoglycemia  - alert and oriented  - CT head unremarkable  Acute appendicitis status post laparoscopic appendectomy  - Ongoing care per General Surgery - appears stable from this standpoint, they signed off 5/3  Demand ischemia - Hx of CAD - Cardiology following - 2D echo EF 45-50%, septal and inferobasal hypokinesis.  Discussed with Cardiology, she has chronic systolic CHD  Chronic systolic CHF - euvolemic, volume status per nephrology  ESRD on M/W/F HD  - Nephrology following - tolerating HD w/o difficulty   Anemia of CKD  - transfused one unit 5/2 with dialysis   HTN - suboptimal, follow, on multiple agents, continue  DM w/ hypoglycemia  - CBGs stable - follow without change  DVT prophylaxis: heparin Code Status: Full Family Communication: no family bedside Disposition Plan: Home when ready, likely one day Barriers for discharge: Antibiotics  Consultants:   Nephrology  Cardiology  Gen. surgery  Procedures:   2D echo: EF 45-50%   HD  Antimicrobials:  Cefepime 4/29 >> 5/4  Metronidazole 4/29 >>  Ciprofloxacin 5/4 >>   Subjective: -No complaints this morning, denies any chest pain, no abdominal pain, nausea or vomiting. She is feeling a lot better than when I last saw her on Sunday   Objective: Filed Vitals:   02/04/16 2221 02/04/16 2339 02/05/16 0452 02/05/16 1000  BP:  157/60 158/60 168/61  Pulse:  59 60 63  Temp:   98.7 F (37.1 C) 98.2 F (36.8 C)  TempSrc:   Oral Oral  Resp:   16 18  Height:      Weight: 73.5 kg (162 lb 0.6 oz)     SpO2:   97% 98%    Intake/Output Summary (Last 24 hours) at 02/05/16 1200 Last data filed at 02/05/16 0900  Gross per 24 hour  Intake    890 ml  Output    300 ml  Net    590 ml   Filed Weights   02/04/16 0735 02/04/16 1125 02/04/16 2221  Weight: 77.8  kg (171 lb 8.3 oz) 74.2 kg (163 lb 9.3 oz) 73.5 kg (162 lb 0.6 oz)    Examination: Constitutional: NAD Filed Vitals:   02/04/16 2221 02/04/16 2339 02/05/16 0452 02/05/16 1000  BP:  157/60 158/60 168/61  Pulse:  59 60 63  Temp:   98.7 F (37.1 C) 98.2 F (36.8 C)  TempSrc:   Oral Oral  Resp:   16 18  Height:      Weight: 73.5 kg (162 lb 0.6 oz)     SpO2:   97% 98%   Eyes: PERRL ENMT: Mucous membranes are moist.  Respiratory: clear to auscultation  bilaterally, no wheezing, no crackles. Normal respiratory effort. No accessory muscle use.  Cardiovascular: Regular rate and rhythm, no murmurs / rubs / gallops. No extremity edema. 2+ pedal pulses.  Abdomen: Mild tenderness left lower quadrant, no rebound, no guarding. Bowel sounds positive.  Neurologic: Nonfocal Psychiatric: Normal judgment and insight. Alert and oriented x 3. Normal mood.    Data Reviewed: I have personally reviewed following labs and imaging studies  CBC:  Recent Labs Lab 01/30/16 1658  02/01/16 1656 02/02/16 0023 02/03/16 0335 02/04/16 0411 02/05/16 0504  WBC 13.4*  < > 11.4* 11.9* 9.5 8.2 7.6  NEUTROABS 10.8*  --   --   --   --  5.6  --   HGB 9.4*  < > 7.4* 7.0* 8.3* 7.4* 7.6*  HCT 28.7*  < > 23.7* 22.4* 25.2* 23.5* 24.5*  MCV 98.6  < > 102.2* 100.9* 93.7 92.2 95.0  PLT 259  < > 190 209 185 201 208  < > = values in this interval not displayed. Basic Metabolic Panel:  Recent Labs Lab 01/31/16 1000 02/01/16 0501 02/01/16 0904 02/01/16 1656 02/02/16 0023 02/03/16 0420 02/04/16 0411  NA 141 138 138 137 134*  134* 135 135  K 4.5 4.4 4.4 4.4 4.6  4.6 3.9 4.2  CL 106 103 102 102 101  99* 98* 99*  CO2 19* $Remov'25 23 23 23  22 26 27  'nehUEA$ GLUCOSE 124* 54* 119* 76 147*  151* 164* 125*  BUN 13 29* 30* 32* 35*  35* 21* 33*  CREATININE 4.70* 7.17* 7.58* 7.94* 8.21*  8.21* 4.99* 6.47*  CALCIUM 8.1* 8.1* 8.1* 8.2* 8.1*  8.0* 8.0* 8.2*  MG  --   --   --   --   --   --  2.1  PHOS 3.5 4.3  --  4.7* 4.6 4.3  --    GFR: Estimated Creatinine Clearance: 9.9 mL/min (by C-G formula based on Cr of 6.47). Liver Function Tests:  Recent Labs Lab 01/30/16 1658  02/01/16 0904 02/01/16 1656 02/02/16 0023 02/03/16 0335 02/03/16 0420 02/04/16 0411  AST 17  --  14*  --   --  12*  --  10*  ALT 17  --  10*  --   --  11*  --  10*  ALKPHOS 92  --  63  --   --  58  --  58  BILITOT 0.4  --  0.6  --   --  0.5  --  0.6  PROT 7.3  --  5.7*  --   --  5.7*  --  5.8*  ALBUMIN  3.6  < > 2.7* 2.5* 2.5* 2.4* 2.4* 2.5*  < > = values in this interval not displayed.  Recent Labs Lab 01/30/16 1658 02/03/16 0335  LIPASE 59* 34   No results for input(s): AMMONIA in the  last 168 hours. Coagulation Profile: No results for input(s): INR, PROTIME in the last 168 hours. Cardiac Enzymes:  Recent Labs Lab 02/01/16 0904 02/01/16 1133 02/01/16 2234  TROPONINI 1.82* 2.10* 2.44*   BNP (last 3 results) No results for input(s): PROBNP in the last 8760 hours. HbA1C:  Recent Labs  02/04/16 0411  HGBA1C 6.8*   CBG:  Recent Labs Lab 02/04/16 1212 02/04/16 1656 02/04/16 1936 02/05/16 0802 02/05/16 1138  GLUCAP 105* 200* 139* 130* 186*   Lipid Profile:  Recent Labs  02/04/16 0411  CHOL 69  HDL 24*  LDLCALC 11  TRIG 211*  CHOLHDL 2.9   Thyroid Function Tests: No results for input(s): TSH, T4TOTAL, FREET4, T3FREE, THYROIDAB in the last 72 hours. Anemia Panel: No results for input(s): VITAMINB12, FOLATE, FERRITIN, TIBC, IRON, RETICCTPCT in the last 72 hours. Urine analysis:    Component Value Date/Time   COLORURINE YELLOW 02/01/2016 1024   APPEARANCEUR CLEAR 02/01/2016 1024   LABSPEC 1.019 02/01/2016 1024   PHURINE 7.5 02/01/2016 1024   GLUCOSEU 100* 02/01/2016 1024   HGBUR NEGATIVE 02/01/2016 1024   BILIRUBINUR NEGATIVE 02/01/2016 1024   KETONESUR NEGATIVE 02/01/2016 1024   PROTEINUR >300* 02/01/2016 1024   NITRITE NEGATIVE 02/01/2016 1024   LEUKOCYTESUR NEGATIVE 02/01/2016 1024   Sepsis Labs: Invalid input(s): PROCALCITONIN, LACTICIDVEN  Recent Results (from the past 240 hour(s))  MRSA PCR Screening     Status: None   Collection Time: 02/01/16  4:00 PM  Result Value Ref Range Status   MRSA by PCR NEGATIVE NEGATIVE Final    Comment:        The GeneXpert MRSA Assay (FDA approved for NASAL specimens only), is one component of a comprehensive MRSA colonization surveillance program. It is not intended to diagnose MRSA infection nor to  guide or monitor treatment for MRSA infections.   Culture, blood (Routine X 2) w Reflex to ID Panel     Status: None (Preliminary result)   Collection Time: 02/02/16  5:38 PM  Result Value Ref Range Status   Specimen Description BLOOD RIGHT HAND  Final   Special Requests BOTTLES DRAWN AEROBIC AND ANAEROBIC  Final   Culture NO GROWTH 2 DAYS  Final   Report Status PENDING  Incomplete  Culture, blood (Routine X 2) w Reflex to ID Panel     Status: None (Preliminary result)   Collection Time: 02/02/16  5:43 PM  Result Value Ref Range Status   Specimen Description BLOOD RIGHT HAND  Final   Special Requests IN PEDIATRIC BOTTLE  Final   Culture NO GROWTH 2 DAYS  Final   Report Status PENDING  Incomplete      Radiology Studies: No results found.   Scheduled Meds: . bumetanide  2 mg Oral Daily  . carvedilol  25 mg Oral BID WC  . ceFEPime (MAXIPIME) IV  2 g Intravenous Q M,W,F-HD   And  . metronidazole  500 mg Intravenous Q8H  . diltiazem  300 mg Oral BID  . docusate sodium  100 mg Oral BID  . Ferric Citrate  210 mg Oral TID WC  . heparin subcutaneous  5,000 Units Subcutaneous Q8H  . hydrALAZINE  100 mg Oral Q8H  . insulin aspart  0-15 Units Subcutaneous TID WC  . insulin aspart  0-5 Units Subcutaneous QHS  . isosorbide dinitrate  5 mg Oral BID  . losartan  100 mg Oral Daily  . omega-3 acid ethyl esters  1,000 mg Oral Daily  . polyethylene  glycol  17 g Oral BID  . pravastatin  40 mg Oral QHS  . sevelamer carbonate  2,400 mg Oral TID WC  . sodium bicarbonate  650 mg Oral QHS   Continuous Infusions:    Marzetta Board, MD, PhD [facility] Pager [phone]  If 7PM-7AM, please contact night-coverage [email or web address] Password [email or web address] 02/05/2016, 12:01 PM

## 2016-02-06 LAB — CBC WITH DIFFERENTIAL/PLATELET
BASOS PCT: 0 %
Basophils Absolute: 0 10*3/uL (ref 0.0–0.1)
Eosinophils Absolute: 0.9 10*3/uL — ABNORMAL HIGH (ref 0.0–0.7)
Eosinophils Relative: 9 %
HEMATOCRIT: 25.7 % — AB (ref 36.0–46.0)
HEMOGLOBIN: 8 g/dL — AB (ref 12.0–15.0)
LYMPHS ABS: 1.8 10*3/uL (ref 0.7–4.0)
LYMPHS PCT: 19 %
MCH: 29.4 pg (ref 26.0–34.0)
MCHC: 31.1 g/dL (ref 30.0–36.0)
MCV: 94.5 fL (ref 78.0–100.0)
MONO ABS: 0.7 10*3/uL (ref 0.1–1.0)
MONOS PCT: 7 %
NEUTROS ABS: 6.3 10*3/uL (ref 1.7–7.7)
Neutrophils Relative %: 65 %
Platelets: 258 10*3/uL (ref 150–400)
RBC: 2.72 MIL/uL — ABNORMAL LOW (ref 3.87–5.11)
RDW: 16.2 % — AB (ref 11.5–15.5)
WBC: 9.7 10*3/uL (ref 4.0–10.5)

## 2016-02-06 LAB — GLUCOSE, CAPILLARY: Glucose-Capillary: 101 mg/dL — ABNORMAL HIGH (ref 65–99)

## 2016-02-06 LAB — RENAL FUNCTION PANEL
ANION GAP: 15 (ref 5–15)
Albumin: 2.6 g/dL — ABNORMAL LOW (ref 3.5–5.0)
BUN: 30 mg/dL — AB (ref 6–20)
CHLORIDE: 95 mmol/L — AB (ref 101–111)
CO2: 24 mmol/L (ref 22–32)
Calcium: 8.5 mg/dL — ABNORMAL LOW (ref 8.9–10.3)
Creatinine, Ser: 6 mg/dL — ABNORMAL HIGH (ref 0.44–1.00)
GFR calc Af Amer: 9 mL/min — ABNORMAL LOW (ref >60)
GFR calc non Af Amer: 8 mL/min — ABNORMAL LOW (ref >60)
GLUCOSE: 118 mg/dL — AB (ref 65–99)
POTASSIUM: 4.3 mmol/L (ref 3.5–5.1)
Phosphorus: 3.6 mg/dL (ref 2.5–4.6)
Sodium: 134 mmol/L — ABNORMAL LOW (ref 135–145)

## 2016-02-06 MED ORDER — OXYCODONE HCL 5 MG PO TABS: 5.0000 mg | ORAL_TABLET | Freq: Four times a day (QID) | ORAL | Status: DC | PRN

## 2016-02-06 MED ORDER — LOSARTAN POTASSIUM 50 MG PO TABS: 100.0000 mg | ORAL_TABLET | Freq: Every day | ORAL | Status: AC

## 2016-02-06 MED ORDER — SODIUM CHLORIDE 0.9 % IV SOLN: 100.0000 mL | INTRAVENOUS | Status: DC | PRN

## 2016-02-06 MED ORDER — HEPARIN SODIUM (PORCINE) 1000 UNIT/ML DIALYSIS: 2000.0000 [IU] | INTRAMUSCULAR | Status: DC | PRN

## 2016-02-06 MED ORDER — LIDOCAINE-PRILOCAINE 2.5-2.5 % EX CREA: 1.0000 "application " | TOPICAL_CREAM | CUTANEOUS | Status: DC | PRN

## 2016-02-06 MED ORDER — ISOSORBIDE DINITRATE 5 MG PO TABS: 5.0000 mg | ORAL_TABLET | Freq: Two times a day (BID) | ORAL | Status: DC

## 2016-02-06 MED ORDER — ALTEPLASE 2 MG IJ SOLR: 2.0000 mg | Freq: Once | INTRAMUSCULAR | Status: DC | PRN

## 2016-02-06 MED ORDER — CIPROFLOXACIN HCL 250 MG PO TABS: 250.0000 mg | ORAL_TABLET | Freq: Every day | ORAL | Status: DC

## 2016-02-06 MED ORDER — DILTIAZEM HCL ER COATED BEADS 300 MG PO CP24: 300.0000 mg | ORAL_CAPSULE | Freq: Two times a day (BID) | ORAL | Status: DC

## 2016-02-06 MED ORDER — LIDOCAINE HCL (PF) 1 % IJ SOLN: 5.0000 mL | INTRAMUSCULAR | Status: DC | PRN

## 2016-02-06 MED ORDER — METRONIDAZOLE 500 MG PO TABS: 500.0000 mg | ORAL_TABLET | Freq: Three times a day (TID) | ORAL | Status: DC

## 2016-02-06 MED ORDER — PENTAFLUOROPROP-TETRAFLUOROETH EX AERO: 1.0000 "application " | INHALATION_SPRAY | CUTANEOUS | Status: DC | PRN

## 2016-02-06 MED ORDER — HEPARIN SODIUM (PORCINE) 1000 UNIT/ML DIALYSIS: 1000.0000 [IU] | INTRAMUSCULAR | Status: DC | PRN

## 2016-02-06 NOTE — Progress Notes (Signed)
Physical Therapy Treatment Patient Details Name: Isabel Sharp MRN: [id number] DOB: October 21, 1970 Today's Date: 02/06/2016    History of Present Illness 45 year old female with history of end-stage renal disease and hypertension who was admitted to the surgical service 01/30/16 with acute appendicitis and underwent laparoscopic appendicectomy and laparoscopic LOA on 4/29. She was initially doing well post-op w/ discussions about her being discharged when she spiked a fever associated w/ decreased mentation and hypoglycemia.   PMH:  CAD, ESRD on HD, CHF, HTN, DM    PT Comments    Patient is functioning at Mod I - min guard level with mobility and gait.  Good balance with gait.  Goals achieved. No f/u PT needs identified.  Patient ready for d/c from PT perspective.  Follow Up Recommendations  No PT follow up;Supervision for mobility/OOB     Equipment Recommendations  None recommended by PT    Recommendations for Other Services       Precautions / Restrictions Precautions Precautions: None Restrictions Weight Bearing Restrictions: No    Mobility  Bed Mobility Overal bed mobility: Needs Assistance Bed Mobility: Supine to Sit;Sit to Supine     Supine to sit: Supervision;HOB elevated Sit to supine: Min guard;HOB elevated   General bed mobility comments: Patient able to negotiate bed mobility with HOB elevated.  Assist for safety.  Transfers Overall transfer level: Modified independent Equipment used: None Transfers: Sit to/from Stand Sit to Stand: Modified independent (Device/Increase time)         General transfer comment: No physical assist needed.  Good balance in stance.  Ambulation/Gait Ambulation/Gait assistance: Supervision Ambulation Distance (Feet): 200 Feet Assistive device: None Gait Pattern/deviations: Step-through pattern;Decreased stride length Gait velocity: decreased Gait velocity interpretation: Below normal speed for age/gender General Gait Details:  Slow guarded gait.  Good balance today.   Stairs            Wheelchair Mobility    Modified Rankin (Stroke Patients Only)       Balance           Standing balance support: No upper extremity supported Standing balance-Leahy Scale: Good                      Cognition Arousal/Alertness: Awake/alert Behavior During Therapy: WFL for tasks assessed/performed Overall Cognitive Status: Within Functional Limits for tasks assessed                      Exercises      General Comments        Pertinent Vitals/Pain Pain Assessment: 0-10 Pain Score: 5  Pain Location: Abdomen - LLQ Pain Descriptors / Indicators: Sore Pain Intervention(s): Monitored during session;Repositioned    Home Living                      Prior Function            PT Goals (current goals can now be found in the care plan section) Progress towards PT goals: Goals met/education completed, patient discharged from PT    Frequency  Min 3X/week    PT Plan Current plan remains appropriate    Co-evaluation             End of Session   Activity Tolerance: Patient tolerated treatment well;Patient limited by pain Patient left: in bed;with call bell/phone within reach     Time: 5974-1638 PT Time Calculation (min) (ACUTE ONLY): 17 min  Charges:  $Gait Training: 8-22 mins  G Codes:      Despina Pole 02/06/2016, 2:01 PM Carita Pian. Sanjuana Kava, [place] Pager [phone]

## 2016-02-06 NOTE — Discharge Summary (Addendum)
Physician Discharge Summary  Isabel Sharp JKD:[id number] DOB: [birth year] DOA: 01/30/2016  PCP: [name], PA-C  Admit date: 01/30/2016 Discharge date: 02/06/2016  Time spent: > 30 minutes  Recommendations for Outpatient Follow-up:  1. Follow up with PCP in 1-2 weeks 2. Follow up with Dr. 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)   CAD (coronary artery disease)   Elevated troponin   Acute appendicitis   SIRS (systemic inflammatory response syndrome) (HCC)   Fever   Altered mental status   Uncontrolled type 2 diabetes mellitus with complication (HCC)   Elevated blood pressure   FUO (fever of unknown origin)  Discharge Condition: stable  Diet recommendation: renal  Filed Weights   02/05/16 1931 02/06/16 0809 02/06/16 1155  Weight: 74 kg (163 lb 2.3 oz) 74.8 kg (164 lb 14.5 oz) 74.3 kg (163 lb 12.8 oz)    History of present illness:  See H&P, Labs, Consult and Test reports for all details in brief, patient is a 45 year old female with history of end-stage renal disease and hypertension who was admitted to the surgical service 01/30/16 with acute appendicitis and underwent laparoscopic appendicectomy and laparoscopic LOA on 4/29. She was initially doing well post-op w/ discussions about her being discharged when she spiked a fever associated w/ decreased mentation. [facility] saw her in consultation, and subsequently took over.   Hospital Course:  Febrile episode 4/30 - No clear etiology at present - CT abdomen with anticipated postoperative appearance but no acute changes - patient does have an indwelling hemodialysis catheter so must keep bacteremia in the differential but blood cultures are presently no growth - afebrile now - given that fever was associated with surgery will narrow antibiotics for  intraabdominal coverage with ciprofloxacin and metronidazole. Plan for empiric 10 days without a clear source, with 4 days remaining Acute encephalopathy - Appears to have been primarily related to hypoglycemia, resolved, CT head unremarkable Acute appendicitis status post laparoscopic appendectomy - Ongoing care per General Surgery - appears stable from this standpoint, they signed off 5/3, outpatient follow up Demand ischemia - Hx of CAD - Cardiology following, 2D echo EF 45-50%, septal and inferobasal hypokinesis. Discussed with Cardiology, she has chronic systolic CHF, no further inpatient workup and outpatient follow up Chronic systolic CHF - euvolemic, volume status per nephrology with HD ESRD on M/W/F HD - outpatient HD. HD 5/5 on day of discharge, tolerated well Anemia of CKD - transfused one unit 5/2 with dialysis  HTN - suboptimal, on multiple agents requiring dose adjustments during this hospitalization, nephrology to follow at HD DM w/ hypoglycemia - CBGs stable - follow without change Metabolic Bone Disease - secondary hyperparathyroidism of renal origin   Procedures:  2D echo  Lap appy  Consultations:  General surgery  Nephrology  Cardiology   Discharge Exam: Filed Vitals:   02/06/16 1155 02/06/16 1255 02/06/16 1500 02/06/16 1536  BP: 180/75 191/55 175/65 163/67  Pulse: 60 65    Temp: 98 F (36.7 C) 99 F (37.2 C)    TempSrc: Oral Oral    Resp: 20 18    Height:      Weight: 74.3 kg (163 lb 12.8 oz)     SpO2: 100% 100%      General: NAD Cardiovascular: RRR Respiratory: CTA biL  Discharge Instructions Activity:  As tolerated  Get Medicines reviewed and adjusted: Please take all your medications with you for your next visit with your Primary MD  Please request your Primary MD to go over all hospital tests and procedure/radiological results at the follow up, please ask your Primary MD to get all Hospital records sent to his/her office.  If you  experience worsening of your admission symptoms, develop shortness of breath, life threatening emergency, suicidal or homicidal thoughts you must seek medical attention immediately by calling 911 or calling your MD immediately if symptoms less severe.  You must read complete instructions/literature along with all the possible adverse reactions/side effects for all the Medicines you take and that have been prescribed to you. Take any new Medicines after you have completely understood and accpet all the possible adverse reactions/side effects.   Do not drive when taking Pain medications.   Do not take more than prescribed Pain, Sleep and Anxiety Medications  Special Instructions: If you have smoked or chewed Tobacco in the last 2 yrs please stop smoking, stop any regular Alcohol and or any Recreational drug use.  Wear Seat belts while driving.  Please note  You were cared for by a hospitalist during your hospital stay. Once you are discharged, your primary care physician will handle any further medical issues. Please note that NO REFILLS for any discharge medications will be authorized once you are discharged, as it is imperative that you return to your primary care physician (or establish a relationship with a primary care physician if you do not have one) for your aftercare needs so that they can reassess your need for medications and monitor your lab values.    Medication List    STOP taking these medications        diltiazem 240 MG 24 hr capsule  Commonly known as:  DILACOR XR  Replaced by:  diltiazem 300 MG 24 hr capsule      TAKE these medications        acetaminophen 325 MG tablet  Commonly known as:  TYLENOL  Take 325 mg by mouth 2 (two) times daily as needed (pain).     aspirin 325 MG tablet  Take 325 mg by mouth daily.     AURYXIA 1 GM 210 MG(Fe) Tabs  Generic drug:  Ferric Citrate  Take 210 mg by mouth 3 (three) times daily with meals.     bumetanide 2 MG tablet   Commonly known as:  BUMEX  Take 2 mg by mouth daily.     carvedilol 25 MG tablet  Commonly known as:  COREG  Take 25 mg by mouth 2 (two) times daily.     ciprofloxacin 250 MG tablet  Commonly known as:  CIPRO  Take 1 tablet (250 mg total) by mouth daily.     diltiazem 300 MG 24 hr capsule  Commonly known as:  CARDIZEM CD  Take 1 capsule (300 mg total) by mouth 2 (two) times daily.     DOCOSAHEXAENOIC ACID PO  Take 1 capsule by mouth every morning.     docusate sodium 100 MG capsule  Commonly known as:  COLACE  Take 100 mg by mouth 2 (two) times daily.     docusate sodium 100 MG capsule  Commonly known as:  COLACE  Take 100 mg by mouth 2 (two) times daily as needed for mild constipation.     epoetin alfa 10000 UNIT/ML injection  Commonly known as:  EPOGEN,PROCRIT  Inject 10,000 Units into the skin every Monday, Wednesday,  and Friday.     glipiZIDE 10 MG tablet  Commonly known as:  GLUCOTROL  Take 10 mg by mouth daily before breakfast.     hydrALAZINE 100 MG tablet  Commonly known as:  APRESOLINE  Take 100 mg by mouth 3 (three) times daily.     insulin glargine 100 UNIT/ML injection  Commonly known as:  LANTUS  Inject 15 Units into the skin every morning. > 140     isosorbide dinitrate 5 MG tablet  Commonly known as:  ISORDIL  Take 1 tablet (5 mg total) by mouth 2 (two) times daily.     lidocaine-prilocaine cream  Commonly known as:  EMLA  Apply 1 application topically every Monday, Wednesday, and Friday.     losartan 50 MG tablet  Commonly known as:  COZAAR  Take 2 tablets (100 mg total) by mouth daily.     metroNIDAZOLE 500 MG tablet  Commonly known as:  FLAGYL  Take 1 tablet (500 mg total) by mouth 3 (three) times daily.     Omega-3 Krill Oil 500 MG Caps  Take 500 mg by mouth daily.     oxyCODONE 5 MG immediate release tablet  Commonly known as:  Oxy IR/ROXICODONE  Take 1-2 tablets (5-10 mg total) by mouth every 4 (four) hours as needed for moderate  pain.     oxyCODONE 5 MG immediate release tablet  Commonly known as:  Oxy IR/ROXICODONE  Take 1-2 tablets (5-10 mg total) by mouth every 6 (six) hours as needed for moderate pain.     pravastatin 40 MG tablet  Commonly known as:  PRAVACHOL  Take 40 mg by mouth at bedtime.     sevelamer carbonate 800 MG tablet  Commonly known as:  RENVELA  Take 2,400 mg by mouth 3 (three) times daily with meals.     sodium bicarbonate 650 MG tablet  Take 650 mg by mouth at bedtime.     Vitamin D3 1000 units Caps  Take by mouth every morning.           Follow-up Information    Follow up with Mickeal Skinner, MD In 2 weeks.   Specialty:  General Surgery   Why:  Call and make an appoinment in 2 weeks after your discharge. Appointment: Feb 19, 2016 @ 2:15pm   Contact information:   [address]  [place] [address] [phone]       Follow up with HEDGECOCK,SUZANNE, PA-C. Schedule an appointment as soon as possible for a visit in 1 week.   Specialty:  Physician Assistant   Contact information:   [address] [place] [address] [phone]       The results of significant diagnostics from this hospitalization (including imaging, microbiology, ancillary and laboratory) are listed below for reference.    Significant Diagnostic Studies: Ct Abdomen Pelvis Wo Contrast  01/30/2016  CLINICAL DATA:  Acute onset of left lower quadrant and right lower quadrant abdominal pain. Initial encounter. EXAM: CT ABDOMEN AND PELVIS WITHOUT CONTRAST TECHNIQUE: Multidetector CT imaging of the abdomen and pelvis was performed following the standard protocol without IV contrast. COMPARISON:  None. FINDINGS: The visualized lung bases are clear. The patient is status post median sternotomy. Scattered coronary artery calcifications are seen. The liver and spleen are unremarkable in appearance. The gallbladder is within normal limits. The pancreas and adrenal glands are  unremarkable. Nonspecific perinephric stranding is noted bilaterally. Scattered bilateral calcifications are noted along the renal arteries  and their branches. There is no evidence of hydronephrosis. No renal or ureteral stones are identified. A 2.1 cm cyst is noted at the posterior aspect of the left kidney. The small bowel is unremarkable in appearance. The stomach is within normal limits. No acute vascular abnormalities are seen. Scattered calcification is seen along the abdominal aorta and its branches, including along the superior mesenteric artery and inferior mesenteric artery. The appendix is dilated 1.2 cm in maximal diameter, with suggestion of a 1.2 cm appendicolith near the base of the appendix, and mild surrounding soft tissue inflammation. This raises concern for mild acute appendicitis. There is no evidence of perforation or abscess formation at this time. Trace underlying fluid is seen. The colon is largely decompressed and is grossly unremarkable in appearance. The bladder is mildly distended and grossly unremarkable. The uterus is unremarkable in appearance. The ovaries are relatively symmetric. No suspicious adnexal masses are seen. No inguinal lymphadenopathy is seen. No acute osseous abnormalities are identified. IMPRESSION: 1. Mild acute appendicitis, with dilatation of the appendix to 1.2 cm in maximal diameter, and suggestion of 1.2 cm appendicolith near the base of the appendix. Mild surrounding soft tissue inflammation noted. No evidence of perforation or abscess formation at this time. 2. Scattered calcification along the abdominal aorta and its branches, including along the superior mesenteric artery and inferior mesenteric artery. Scattered calcifications along the renal arteries and their branches. 3. Scattered coronary artery calcifications seen. 4. Small left renal cyst noted. These results were called by telephone at the time of interpretation on 01/30/2016 at 8:40 pm to Dr. Rockne Menghini, who verbally acknowledged these results. Electronically Signed   By: Garald Balding M.D.   On: 01/30/2016 20:41   Dg Abd 1 View  02/01/2016  CLINICAL DATA:  Acute encephalopathy. Pt c/o hardened, distended stomach AND general abdominal pain. EXAM: ABDOMEN - 1 VIEW COMPARISON:  None. FINDINGS: No evidence of obstruction. No dilated loops of bowel. Calcifications splenic artery noted. IMPRESSION: No acute finding Electronically Signed   By: Skipper Cliche M.D.   On: 02/01/2016 09:29   Ct Head Wo Contrast  02/01/2016  CLINICAL DATA:  Decreased responsiveness EXAM: CT HEAD WITHOUT CONTRAST TECHNIQUE: Contiguous axial images were obtained from the base of the skull through the vertex without intravenous contrast. COMPARISON:  None. FINDINGS: Brain parenchyma, ventricular system, and extra-axial space are within normal limits. No mass effect, midline shift, or acute hemorrhage. Cranium is intact. IMPRESSION: No acute intracranial pathology. Electronically Signed   By: Marybelle Killings M.D.   On: 02/01/2016 13:43   Ct Abdomen Pelvis W Contrast  02/01/2016  CLINICAL DATA:  Acute appendicitis, laparoscopic appendectomy 01/31/16, peritonitis, fever EXAM: CT ABDOMEN AND PELVIS WITH CONTRAST TECHNIQUE: Multidetector CT imaging of the abdomen and pelvis was performed using the standard protocol following bolus administration of intravenous contrast. CONTRAST:  148mL ISOVUE-300 IOPAMIDOL (ISOVUE-300) INJECTION 61% COMPARISON:  01/30/16 FINDINGS: Lower chest: Hypoventilatory change at both lung bases. Trace pleural fluid. Hepatobiliary: Negative Pancreas: Negative Spleen: Negative Adrenals/Urinary Tract: 1 cm left adrenal myelolipoma. The right adrenal normal. Stable bilateral perinephric inflammation likely chronic. 2 cm cyst midpole left kidney. Stomach/Bowel: Postsurgical change in the region of the appendix. No evidence of significant free fluid in the abdomen or pelvis. Trace free fluid in the right pelvis. No  loculated fluid collections. Vascular/Lymphatic: No acute abnormalities. Stable retroperitoneal and mesenteric lymph nodes. Reproductive: Negative Other: Small volume pneumoperitoneum consistent with recent laparoscopy. Musculoskeletal: No acute osseous abnormalities. Air in the periumbilical  soft tissues anterior abdominal wall on the left with mild inflammatory change in the region consistent with recent laparoscopy. IMPRESSION: Anticipated postoperative appearance with no significant abnormalities. Small volume pneumoperitoneum consistent with recent laparoscopic appendectomy. Electronically Signed   By: Skipper Cliche M.D.   On: 02/01/2016 13:50   Dg Chest Port 1 View  02/01/2016  CLINICAL DATA:  Fever. Nonsmoker. Diabetes and coronary artery disease. EXAM: PORTABLE CHEST 1 VIEW COMPARISON:  10/26/2015 FINDINGS: Moderate cardiac enlargement. Previous median sternotomy and CABG procedure. There is a right chest wall dialysis catheter with tips in the cavoatrial junction. No pleural effusion or edema. No airspace consolidation. IMPRESSION: 1. No acute findings. 2. Cardiac enlargement. Electronically Signed   By: Kerby Moors M.D.   On: 02/01/2016 11:05    Microbiology: Recent Results (from the past 240 hour(s))  MRSA PCR Screening     Status: None   Collection Time: 02/01/16  4:00 PM  Result Value Ref Range Status   MRSA by PCR NEGATIVE NEGATIVE Final    Comment:        The GeneXpert MRSA Assay (FDA approved for NASAL specimens only), is one component of a comprehensive MRSA colonization surveillance program. It is not intended to diagnose MRSA infection nor to guide or monitor treatment for MRSA infections.   Culture, blood (Routine X 2) w Reflex to ID Panel     Status: None (Preliminary result)   Collection Time: 02/02/16  5:38 PM  Result Value Ref Range Status   Specimen Description BLOOD RIGHT HAND  Final   Special Requests BOTTLES DRAWN AEROBIC AND ANAEROBIC 5ML  Final   Culture  NO GROWTH 4 DAYS  Final   Report Status PENDING  Incomplete  Culture, blood (Routine X 2) w Reflex to ID Panel     Status: None (Preliminary result)   Collection Time: 02/02/16  5:43 PM  Result Value Ref Range Status   Specimen Description BLOOD RIGHT HAND  Final   Special Requests IN PEDIATRIC BOTTLE 3ML  Final   Culture NO GROWTH 4 DAYS  Final   Report Status PENDING  Incomplete     Labs: Basic Metabolic Panel:  Recent Labs Lab 01/30/16 1658 01/31/16 0631 01/31/16 1000 02/01/16 0501 02/01/16 0904 02/01/16 1656 02/02/16 0023 02/03/16 0420 02/04/16 0411 02/06/16 0830  NA 138  --  141 138 138 137 134*  134* 135 135 134*  K 3.3*  --  4.5 4.4 4.4 4.4 4.6  4.6 3.9 4.2 4.3  CL 102  --  106 103 102 102 101  99* 98* 99* 95*  CO2 31  --  19* $Re'25 23 23 23  22 26 27 24  'mzU$ GLUCOSE 164*  --  124* 54* 119* 76 147*  151* 164* 125* 118*  BUN 10  --  13 29* 30* 32* 35*  35* 21* 33* 30*  CREATININE 3.39* 4.70* 4.70* 7.17* 7.58* 7.94* 8.21*  8.21* 4.99* 6.47* 6.00*  CALCIUM 8.3*  --  8.1* 8.1* 8.1* 8.2* 8.1*  8.0* 8.0* 8.2* 8.5*  MG  --   --   --   --   --   --   --   --  2.1  --   PHOS  --   --  3.5 4.3  --  4.7* 4.6 4.3  --  3.6   Liver Function Tests:  Recent Labs Lab 01/30/16 1658  02/01/16 0904  02/02/16 0023 02/03/16 0335 02/03/16 0420 02/04/16 0411 02/06/16 0830  AST 17  --  14*  --   --  12*  --  10*  --   ALT 17  --  10*  --   --  11*  --  10*  --   ALKPHOS 92  --  63  --   --  58  --  58  --   BILITOT 0.4  --  0.6  --   --  0.5  --  0.6  --   PROT 7.3  --  5.7*  --   --  5.7*  --  5.8*  --   ALBUMIN 3.6  < > 2.7*  < > 2.5* 2.4* 2.4* 2.5* 2.6*  < > = values in this interval not displayed.  Recent Labs Lab 01/30/16 1658 02/03/16 0335  LIPASE 59* 34   No results for input(s): AMMONIA in the last 168 hours. CBC:  Recent Labs Lab 01/30/16 1658  02/02/16 0023 02/03/16 0335 02/04/16 0411 02/05/16 0504 02/06/16 0830  WBC 13.4*  < > 11.9* 9.5 8.2 7.6 9.7   NEUTROABS 10.8*  --   --   --  5.6  --  6.3  HGB 9.4*  < > 7.0* 8.3* 7.4* 7.6* 8.0*  HCT 28.7*  < > 22.4* 25.2* 23.5* 24.5* 25.7*  MCV 98.6  < > 100.9* 93.7 92.2 95.0 94.5  PLT 259  < > 209 185 201 208 258  < > = values in this interval not displayed. Cardiac Enzymes:  Recent Labs Lab 02/01/16 0904 02/01/16 1133 02/01/16 2234  TROPONINI 1.82* 2.10* 2.44*   BNP: BNP (last 3 results) No results for input(s): BNP in the last 8760 hours.  ProBNP (last 3 results) No results for input(s): PROBNP in the last 8760 hours.  CBG:  Recent Labs Lab 02/05/16 0802 02/05/16 1138 02/05/16 1620 02/05/16 1928 02/06/16 1250  GLUCAP 130* 186* 159* 114* 101*       Signed:  Tevita Gomer  [facility] 02/06/2016, 4:41 PM

## 2016-02-06 NOTE — Procedures (Signed)
  I was present at this dialysis session, have reviewed the session itself and made  appropriate changes Kelly Splinter MD [place] pager [phone]    cell [phone] 02/06/2016, 1:16 PM

## 2016-02-07 LAB — CULTURE, BLOOD (ROUTINE X 2)
CULTURE: NO GROWTH
CULTURE: NO GROWTH

## 2016-02-18 ENCOUNTER — Emergency Department (HOSPITAL_BASED_OUTPATIENT_CLINIC_OR_DEPARTMENT_OTHER): Payer: BLUE CROSS/BLUE SHIELD

## 2016-02-18 ENCOUNTER — Inpatient Hospital Stay (HOSPITAL_BASED_OUTPATIENT_CLINIC_OR_DEPARTMENT_OTHER)
Admission: EM | Admit: 2016-02-18 | Discharge: 2016-02-22 | DRG: 193 | Disposition: A | Discharge status: Home / Self Care | Payer: BLUE CROSS/BLUE SHIELD | Attending: Internal Medicine | Admitting: Internal Medicine

## 2016-02-18 ENCOUNTER — Encounter (HOSPITAL_BASED_OUTPATIENT_CLINIC_OR_DEPARTMENT_OTHER): Payer: Self-pay | Admitting: Emergency Medicine

## 2016-02-18 DIAGNOSIS — J189 Pneumonia, unspecified organism: Principal | ICD-10-CM

## 2016-02-18 DIAGNOSIS — Z6831 Body mass index (BMI) 31.0-31.9, adult: Secondary | ICD-10-CM

## 2016-02-18 DIAGNOSIS — I10 Essential (primary) hypertension: Secondary | ICD-10-CM | POA: Diagnosis not present

## 2016-02-18 DIAGNOSIS — Z951 Presence of aortocoronary bypass graft: Secondary | ICD-10-CM

## 2016-02-18 DIAGNOSIS — R0602 Shortness of breath: Secondary | ICD-10-CM | POA: Diagnosis present

## 2016-02-18 DIAGNOSIS — Z7982 Long term (current) use of aspirin: Secondary | ICD-10-CM | POA: Diagnosis not present

## 2016-02-18 DIAGNOSIS — Z7984 Long term (current) use of oral hypoglycemic drugs: Secondary | ICD-10-CM | POA: Diagnosis not present

## 2016-02-18 DIAGNOSIS — Z794 Long term (current) use of insulin: Secondary | ICD-10-CM

## 2016-02-18 DIAGNOSIS — N186 End stage renal disease: Secondary | ICD-10-CM | POA: Diagnosis present

## 2016-02-18 DIAGNOSIS — D631 Anemia in chronic kidney disease: Secondary | ICD-10-CM | POA: Diagnosis present

## 2016-02-18 DIAGNOSIS — I120 Hypertensive chronic kidney disease with stage 5 chronic kidney disease or end stage renal disease: Secondary | ICD-10-CM | POA: Diagnosis present

## 2016-02-18 DIAGNOSIS — E1129 Type 2 diabetes mellitus with other diabetic kidney complication: Secondary | ICD-10-CM | POA: Diagnosis present

## 2016-02-18 DIAGNOSIS — J9601 Acute respiratory failure with hypoxia: Secondary | ICD-10-CM

## 2016-02-18 DIAGNOSIS — I2510 Atherosclerotic heart disease of native coronary artery without angina pectoris: Secondary | ICD-10-CM | POA: Diagnosis present

## 2016-02-18 DIAGNOSIS — E8889 Other specified metabolic disorders: Secondary | ICD-10-CM | POA: Diagnosis present

## 2016-02-18 DIAGNOSIS — Y95 Nosocomial condition: Secondary | ICD-10-CM | POA: Diagnosis present

## 2016-02-18 DIAGNOSIS — E1122 Type 2 diabetes mellitus with diabetic chronic kidney disease: Secondary | ICD-10-CM | POA: Diagnosis present

## 2016-02-18 DIAGNOSIS — E669 Obesity, unspecified: Secondary | ICD-10-CM | POA: Diagnosis present

## 2016-02-18 DIAGNOSIS — Z79899 Other long term (current) drug therapy: Secondary | ICD-10-CM | POA: Diagnosis not present

## 2016-02-18 DIAGNOSIS — Z992 Dependence on renal dialysis: Secondary | ICD-10-CM | POA: Diagnosis not present

## 2016-02-18 LAB — COMPREHENSIVE METABOLIC PANEL
ALK PHOS: 93 U/L (ref 38–126)
ALT: 16 U/L (ref 14–54)
AST: 20 U/L (ref 15–41)
Albumin: 3.5 g/dL (ref 3.5–5.0)
Anion gap: 11 (ref 5–15)
BUN: 11 mg/dL (ref 6–20)
CALCIUM: 8.4 mg/dL — AB (ref 8.9–10.3)
CO2: 27 mmol/L (ref 22–32)
Chloride: 98 mmol/L — ABNORMAL LOW (ref 101–111)
Creatinine, Ser: 2.44 mg/dL — ABNORMAL HIGH (ref 0.44–1.00)
GFR calc non Af Amer: 22 mL/min — ABNORMAL LOW (ref >60)
GFR, EST AFRICAN AMERICAN: 26 mL/min — AB (ref >60)
GLUCOSE: 185 mg/dL — AB (ref 65–99)
Potassium: 4 mmol/L (ref 3.5–5.1)
SODIUM: 136 mmol/L (ref 135–145)
Total Bilirubin: 0.8 mg/dL (ref 0.3–1.2)
Total Protein: 7.7 g/dL (ref 6.5–8.1)

## 2016-02-18 LAB — I-STAT CG4 LACTIC ACID, ED: Lactic Acid, Venous: 1.79 mmol/L (ref 0.5–2.0)

## 2016-02-18 LAB — CBC WITH DIFFERENTIAL/PLATELET
Basophils Absolute: 0 10*3/uL (ref 0.0–0.1)
Basophils Relative: 0 %
EOS ABS: 0.1 10*3/uL (ref 0.0–0.7)
Eosinophils Relative: 1 %
HEMATOCRIT: 29.9 % — AB (ref 36.0–46.0)
HEMOGLOBIN: 9.6 g/dL — AB (ref 12.0–15.0)
LYMPHS ABS: 0.6 10*3/uL — AB (ref 0.7–4.0)
LYMPHS PCT: 5 %
MCH: 30.3 pg (ref 26.0–34.0)
MCHC: 32.1 g/dL (ref 30.0–36.0)
MCV: 94.3 fL (ref 78.0–100.0)
Monocytes Absolute: 0.5 10*3/uL (ref 0.1–1.0)
Monocytes Relative: 4 %
NEUTROS ABS: 10 10*3/uL — AB (ref 1.7–7.7)
NEUTROS PCT: 90 %
Platelets: 287 10*3/uL (ref 150–400)
RBC: 3.17 MIL/uL — AB (ref 3.87–5.11)
RDW: 15 % (ref 11.5–15.5)
WBC: 11.1 10*3/uL — AB (ref 4.0–10.5)

## 2016-02-18 LAB — I-STAT ARTERIAL BLOOD GAS, ED
Acid-Base Excess: 4 mmol/L — ABNORMAL HIGH (ref 0.0–2.0)
Bicarbonate: 27.3 mEq/L — ABNORMAL HIGH (ref 20.0–24.0)
O2 Saturation: 95 %
PCO2 ART: 37.3 mmHg (ref 35.0–45.0)
PH ART: 7.48 — AB (ref 7.350–7.450)
PO2 ART: 78 mmHg — AB (ref 80.0–100.0)
Patient temperature: 102.8
TCO2: 28 mmol/L (ref 0–100)

## 2016-02-18 LAB — GLUCOSE, CAPILLARY: Glucose-Capillary: 168 mg/dL — ABNORMAL HIGH (ref 65–99)

## 2016-02-18 MED ORDER — DILTIAZEM HCL ER COATED BEADS 180 MG PO CP24
300.0000 mg | ORAL_CAPSULE | Freq: Two times a day (BID) | ORAL | Status: DC
  Administered 2016-02-18 – 2016-02-22 (×7): 300 mg via ORAL
  Filled 2016-02-18 (×8): qty 1

## 2016-02-18 MED ORDER — IPRATROPIUM-ALBUTEROL 0.5-2.5 (3) MG/3ML IN SOLN
3.0000 mL | RESPIRATORY_TRACT | Status: DC
  Administered 2016-02-18 (×2): 3 mL via RESPIRATORY_TRACT
  Filled 2016-02-18 (×2): qty 3

## 2016-02-18 MED ORDER — VANCOMYCIN HCL IN DEXTROSE 1-5 GM/200ML-% IV SOLN
1000.0000 mg | Freq: Once | INTRAVENOUS | Status: AC
  Administered 2016-02-18: 1000 mg via INTRAVENOUS
  Filled 2016-02-18: qty 200

## 2016-02-18 MED ORDER — FERRIC CITRATE 1 GM 210 MG(FE) PO TABS: 210.0000 mg | ORAL_TABLET | Freq: Three times a day (TID) | ORAL | Status: DC

## 2016-02-18 MED ORDER — ISOSORBIDE DINITRATE 5 MG PO TABS
5.0000 mg | ORAL_TABLET | Freq: Two times a day (BID) | ORAL | Status: DC
  Administered 2016-02-18 – 2016-02-19 (×3): 5 mg via ORAL
  Filled 2016-02-18 (×4): qty 1

## 2016-02-18 MED ORDER — ACETAMINOPHEN 325 MG PO TABS
325.0000 mg | ORAL_TABLET | Freq: Two times a day (BID) | ORAL | Status: DC | PRN
  Administered 2016-02-20: 325 mg via ORAL
  Filled 2016-02-18 (×2): qty 1

## 2016-02-18 MED ORDER — PRAVASTATIN SODIUM 40 MG PO TABS
40.0000 mg | ORAL_TABLET | Freq: Every day | ORAL | Status: DC
  Administered 2016-02-18 – 2016-02-21 (×4): 40 mg via ORAL
  Filled 2016-02-18 (×4): qty 1

## 2016-02-18 MED ORDER — HYDRALAZINE HCL 20 MG/ML IJ SOLN
5.0000 mg | Freq: Four times a day (QID) | INTRAMUSCULAR | Status: AC | PRN
  Administered 2016-02-19 – 2016-02-20 (×2): 5 mg via INTRAVENOUS
  Filled 2016-02-18 (×2): qty 1

## 2016-02-18 MED ORDER — INSULIN ASPART 100 UNIT/ML ~~LOC~~ SOLN
0.0000 [IU] | Freq: Three times a day (TID) | SUBCUTANEOUS | Status: DC
  Administered 2016-02-19 (×2): 2 [IU] via SUBCUTANEOUS
  Administered 2016-02-19: 3 [IU] via SUBCUTANEOUS
  Administered 2016-02-20: 5 [IU] via SUBCUTANEOUS
  Administered 2016-02-21 (×2): 3 [IU] via SUBCUTANEOUS
  Administered 2016-02-21: 1 [IU] via SUBCUTANEOUS
  Administered 2016-02-22 (×2): 2 [IU] via SUBCUTANEOUS

## 2016-02-18 MED ORDER — OMEGA-3-ACID ETHYL ESTERS 1 G PO CAPS
1.0000 g | ORAL_CAPSULE | Freq: Every day | ORAL | Status: DC
  Administered 2016-02-19 – 2016-02-22 (×4): 1 g via ORAL
  Filled 2016-02-18 (×4): qty 1

## 2016-02-18 MED ORDER — LOSARTAN POTASSIUM 50 MG PO TABS
100.0000 mg | ORAL_TABLET | Freq: Every day | ORAL | Status: DC
  Administered 2016-02-19 – 2016-02-22 (×4): 100 mg via ORAL
  Filled 2016-02-18 (×4): qty 2

## 2016-02-18 MED ORDER — LIDOCAINE-PRILOCAINE 2.5-2.5 % EX CREA
1.0000 | TOPICAL_CREAM | CUTANEOUS | Status: DC
Start: 2016-02-18 — End: 2016-02-18

## 2016-02-18 MED ORDER — OMEGA-3 KRILL OIL 500 MG PO CAPS: 500.0000 mg | ORAL_CAPSULE | Freq: Every day | ORAL | Status: DC

## 2016-02-18 MED ORDER — ACETAMINOPHEN 325 MG PO TABS
650.0000 mg | ORAL_TABLET | Freq: Once | ORAL | Status: AC
  Administered 2016-02-18: 650 mg via ORAL
  Filled 2016-02-18: qty 2

## 2016-02-18 MED ORDER — HYDRALAZINE HCL 50 MG PO TABS
100.0000 mg | ORAL_TABLET | Freq: Three times a day (TID) | ORAL | Status: DC
  Administered 2016-02-18 – 2016-02-22 (×11): 100 mg via ORAL
  Filled 2016-02-18 (×11): qty 2

## 2016-02-18 MED ORDER — VANCOMYCIN HCL IN DEXTROSE 750-5 MG/150ML-% IV SOLN
750.0000 mg | INTRAVENOUS | Status: DC
  Administered 2016-02-20: 750 mg via INTRAVENOUS
  Filled 2016-02-18: qty 150

## 2016-02-18 MED ORDER — BUMETANIDE 2 MG PO TABS
2.0000 mg | ORAL_TABLET | Freq: Every day | ORAL | Status: DC
  Administered 2016-02-19 – 2016-02-22 (×4): 2 mg via ORAL
  Filled 2016-02-18 (×4): qty 1

## 2016-02-18 MED ORDER — HEPARIN SODIUM (PORCINE) 5000 UNIT/ML IJ SOLN
5000.0000 [IU] | Freq: Three times a day (TID) | INTRAMUSCULAR | Status: DC
  Administered 2016-02-18 – 2016-02-22 (×10): 5000 [IU] via SUBCUTANEOUS
  Filled 2016-02-18 (×10): qty 1

## 2016-02-18 MED ORDER — CARVEDILOL 25 MG PO TABS
25.0000 mg | ORAL_TABLET | Freq: Two times a day (BID) | ORAL | Status: DC
  Administered 2016-02-19 – 2016-02-22 (×6): 25 mg via ORAL
  Filled 2016-02-18 (×7): qty 1

## 2016-02-18 MED ORDER — SODIUM CHLORIDE 0.9 % IV BOLUS (SEPSIS)
500.0000 mL | Freq: Once | INTRAVENOUS | Status: AC
  Administered 2016-02-18: 500 mL via INTRAVENOUS

## 2016-02-18 MED ORDER — POLYETHYLENE GLYCOL 3350 17 G PO PACK
17.0000 g | PACK | Freq: Every day | ORAL | Status: DC | PRN
  Administered 2016-02-18: 17 g via ORAL
  Filled 2016-02-18: qty 1

## 2016-02-18 MED ORDER — SODIUM BICARBONATE 650 MG PO TABS
650.0000 mg | ORAL_TABLET | Freq: Every day | ORAL | Status: DC
  Administered 2016-02-18 – 2016-02-21 (×4): 650 mg via ORAL
  Filled 2016-02-18 (×5): qty 1

## 2016-02-18 MED ORDER — HYDROCOD POLST-CPM POLST ER 10-8 MG/5ML PO SUER
5.0000 mL | Freq: Once | ORAL | Status: AC
  Administered 2016-02-18: 5 mL via ORAL
  Filled 2016-02-18: qty 5

## 2016-02-18 MED ORDER — ASPIRIN 325 MG PO TABS
325.0000 mg | ORAL_TABLET | Freq: Every day | ORAL | Status: DC
  Administered 2016-02-19 – 2016-02-22 (×4): 325 mg via ORAL
  Filled 2016-02-18 (×4): qty 1

## 2016-02-18 MED ORDER — DOCUSATE SODIUM 100 MG PO CAPS
100.0000 mg | ORAL_CAPSULE | Freq: Two times a day (BID) | ORAL | Status: DC
  Administered 2016-02-18 – 2016-02-22 (×8): 100 mg via ORAL
  Filled 2016-02-18 (×8): qty 1

## 2016-02-18 MED ORDER — PIPERACILLIN-TAZOBACTAM IN DEX 2-0.25 GM/50ML IV SOLN
2.2500 g | Freq: Three times a day (TID) | INTRAVENOUS | Status: DC
  Administered 2016-02-19 – 2016-02-21 (×7): 2.25 g via INTRAVENOUS
  Filled 2016-02-18 (×8): qty 50

## 2016-02-18 MED ORDER — PIPERACILLIN-TAZOBACTAM 3.375 G IVPB 30 MIN
3.3750 g | Freq: Once | INTRAVENOUS | Status: AC
  Administered 2016-02-18: 3.375 g via INTRAVENOUS
  Filled 2016-02-18 (×2): qty 50

## 2016-02-18 MED ORDER — ACETAMINOPHEN 500 MG PO TABS
1000.0000 mg | ORAL_TABLET | Freq: Once | ORAL | Status: AC
  Administered 2016-02-18: 1000 mg via ORAL
  Filled 2016-02-18 (×2): qty 2

## 2016-02-18 MED ORDER — VANCOMYCIN HCL 500 MG IV SOLR
500.0000 mg | Freq: Once | INTRAVENOUS | Status: AC
  Administered 2016-02-18: 500 mg via INTRAVENOUS
  Filled 2016-02-18: qty 500

## 2016-02-18 MED ORDER — BENZONATATE 100 MG PO CAPS
100.0000 mg | ORAL_CAPSULE | Freq: Once | ORAL | Status: AC
  Administered 2016-02-18: 100 mg via ORAL
  Filled 2016-02-18: qty 1

## 2016-02-18 MED ORDER — INSULIN GLARGINE 100 UNIT/ML ~~LOC~~ SOLN
10.0000 [IU] | Freq: Every day | SUBCUTANEOUS | Status: DC
  Administered 2016-02-18 – 2016-02-21 (×4): 10 [IU] via SUBCUTANEOUS
  Filled 2016-02-18 (×6): qty 0.1

## 2016-02-18 MED ORDER — SEVELAMER CARBONATE 800 MG PO TABS
2400.0000 mg | ORAL_TABLET | Freq: Three times a day (TID) | ORAL | Status: DC
Start: 2016-02-19 — End: 2016-02-22
  Administered 2016-02-19 – 2016-02-22 (×10): 2400 mg via ORAL
  Filled 2016-02-18 (×10): qty 3

## 2016-02-18 MED ORDER — IPRATROPIUM-ALBUTEROL 0.5-2.5 (3) MG/3ML IN SOLN
3.0000 mL | Freq: Three times a day (TID) | RESPIRATORY_TRACT | Status: DC
  Administered 2016-02-19 – 2016-02-21 (×9): 3 mL via RESPIRATORY_TRACT
  Filled 2016-02-18 (×9): qty 3

## 2016-02-18 NOTE — ED Notes (Signed)
Pt brought to ED via EMS after completing dialysis. C/O SHOB and cough. Dyspnea noted. MD to bedside and Crystal, RRT advised.Marland Kitchen

## 2016-02-18 NOTE — Progress Notes (Signed)
Patient coming from the center [facility] to [facility] for admission. 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 Patient coming to step down Antibiotics and respiratory therapy initiated. EDP to consult nephrology for possible emergent dialysis if patient decompensates. Last dialysis on day of admission at outpatient facility.   Linna Darner, MD [facility] 02/18/2016, 2:46 PM

## 2016-02-18 NOTE — ED Notes (Signed)
Family at bedside. 

## 2016-02-18 NOTE — ED Notes (Signed)
Report given to nurse on 2 central

## 2016-02-18 NOTE — Progress Notes (Signed)
Pharmacy Antibiotic Note  Isabel Sharp is a 45 y.o. female admitted on 02/18/2016 with sepsis.  Pharmacy has been consulted for vancomycin and Zosyn dosing. Patient is ESRD on HD MWF- last went to HD today- had persistent cough and SOB that developed last night.   Given Zosyn 3.375g IV x1 and vancomycin 1g IV x1 in the ED.  Plan: -vancomycin $RemoveBeforeDEI'500mg'ddoqOhRqWSfWlvcr$  IV x1 to finish loading dose, then $RemoveBe'750mg'lLYcCfqms$  IV qHD-MWF -Zosyn 2.25g IV q8h -follow c/s, clinical progression, HD schedule/tolerance  Height: $Remove'5\' 1"'yCWTScQ$  (154.9 cm) Weight: 165 lb (74.844 kg) IBW/kg (Calculated) : 47.8  Temp (24hrs), Avg:102.3 F (39.1 C), Min:100.5 F (38.1 C), Max:103 F (39.4 C)   Recent Labs Lab 02/18/16 1245 02/18/16 1336  WBC 11.1*  --   CREATININE 2.44*  --   LATICACIDVEN  --  1.79    Estimated Creatinine Clearance: 26.4 mL/min (by C-G formula based on Cr of 2.44).    Allergies  Allergen Reactions  . Norvasc [Amlodipine] Swelling    Antimicrobials this admission: Vancomycin 5/17 >>  Zosyn 5/17 >>   Dose adjustments this admission: n/a  Microbiology results: 5/17 BCx:  5/17 Sputum:     Thank you for allowing pharmacy to be a part of this patient's care.   Kolbie Clarkston D. Roseland Braun, PharmD, BCPS Clinical Pharmacist Pager: [phone] 02/18/2016 9:04 PM

## 2016-02-18 NOTE — ED Notes (Signed)
Attempt to given report, charge states bed not assigned to nurse yet and will all back

## 2016-02-18 NOTE — ED Notes (Signed)
MD at bedside. 

## 2016-02-18 NOTE — ED Notes (Signed)
Right radial artery x1 attempt. No complications noted. Bleeding stopped.

## 2016-02-18 NOTE — ED Notes (Signed)
Md aware of pt temp

## 2016-02-18 NOTE — ED Notes (Signed)
Patient transported to X-ray 

## 2016-02-18 NOTE — ED Notes (Addendum)
Pt coming from dialysis center via [facility] c/o cough and low grade fever onset yesterday but worse today. Afebrile at facility but 100.5 here. Actively coughing, hypertensive. Did complete dialysis tx today, fistula in L arm.

## 2016-02-18 NOTE — ED Provider Notes (Signed)
CSN: [id number]     Arrival date & time 02/18/16  1212 History   First MD Initiated Contact with Patient 02/18/16 1219     Chief Complaint  Patient presents with  . Cough     (Consider location/radiation/quality/duration/timing/severity/associated sxs/prior Treatment) Patient is a 45 y.o. female presenting with fever.  Fever Temp source:  Subjective Severity:  Mild Duration:  1 day Progression:  Worsening Chronicity:  New Relieved by:  None tried Worsened by:  Nothing tried Ineffective treatments:  None tried Associated symptoms: congestion and cough   Associated symptoms: no dysuria, no ear pain, no nausea and no vomiting     Past Medical History  Diagnosis Date  . End-stage renal disease on hemodialysis ([place])   . 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 Coronary artery disease   . Carotid artery disease [facility])    Past Surgical History  Procedure Laterality Date  . Cesarean section    . Laparoscopic bilateral salpingo oopherectomy    . Cardiac catheterization  02/21/14    done at [facility]-  . Coronary artery bypass graft  02/24/14    essel coronary artery disease and had bypass grafting in May 2015 with a mammary to the LAD, a saphenous vein graft to the OM and a saphenous vein graft to the diagonal  . Carotid endarterectomy Left 04/30/2015  . Laparoscopic appendectomy N/A 01/30/2016    Procedure: APPENDECTOMY LAPAROSCOPIC;  Surgeon: Mickeal Skinner, MD;  Location: [facility] OR;  Service: General;  Laterality: N/A;   Family History  Problem Relation Age of Onset  . Heart attack Mother   . Heart attack Father   . Heart attack Maternal Grandmother   . Heart attack Maternal Grandfather   . Hypertension Father    Social History  Substance Use Topics  . Smoking status: Never Smoker   . Smokeless tobacco: Never Used  . Alcohol Use: No   OB History    No data available     Review of Systems  Constitutional: Positive for fever.  HENT: Positive for  congestion. Negative for ear pain.   Respiratory: Positive for cough and shortness of breath.   Gastrointestinal: Negative for nausea and vomiting.  Genitourinary: Negative for dysuria.  Musculoskeletal: Negative for back pain and neck pain.  All other systems reviewed and are negative.     Allergies  Norvasc  Home Medications   Prior to Admission medications   Medication Sig Start Date End Date Taking? Authorizing Provider  acetaminophen (TYLENOL) 325 MG tablet Take 325 mg by mouth 2 (two) times daily as needed (pain).    Historical Provider, MD  aspirin 325 MG tablet Take 325 mg by mouth daily.    Historical Provider, MD  bumetanide (BUMEX) 2 MG tablet Take 2 mg by mouth daily.    Historical Provider, MD  carvedilol (COREG) 25 MG tablet Take 25 mg by mouth 2 (two) times daily.     Historical Provider, MD  Cholecalciferol (VITAMIN D3) 1000 UNITS CAPS Take by mouth every morning.    Historical Provider, MD  ciprofloxacin (CIPRO) 250 MG tablet Take 1 tablet (250 mg total) by mouth daily. 02/06/16   [name], MD  diltiazem (CARDIZEM CD) 300 MG 24 hr capsule Take 1 capsule (300 mg total) by mouth 2 (two) times daily. 02/06/16   [name], MD  DOCOSAHEXAENOIC ACID PO Take 1 capsule by mouth every morning.    Historical Provider, MD  docusate sodium (COLACE) 100 MG  capsule Take 100 mg by mouth 2 (two) times daily.    Historical Provider, MD  docusate sodium (COLACE) 100 MG capsule Take 100 mg by mouth 2 (two) times daily as needed for mild constipation.     Historical Provider, MD  epoetin alfa (EPOGEN,PROCRIT) 35009 UNIT/ML injection Inject 10,000 Units into the skin every Monday, Wednesday, and Friday.     Historical Provider, MD  Ferric Citrate (AURYXIA) 1 GM 210 MG(Fe) TABS Take 210 mg by mouth 3 (three) times daily with meals.     Historical Provider, MD  glipiZIDE (GLUCOTROL) 10 MG tablet Take 10 mg by mouth daily before breakfast.     Historical Provider, MD  hydrALAZINE  (APRESOLINE) 100 MG tablet Take 100 mg by mouth 3 (three) times daily.    Historical Provider, MD  insulin glargine (LANTUS) 100 UNIT/ML injection Inject 15 Units into the skin every morning. > 140    Historical Provider, MD  isosorbide dinitrate (ISORDIL) 5 MG tablet Take 1 tablet (5 mg total) by mouth 2 (two) times daily. 02/06/16   [name], MD  lidocaine-prilocaine (EMLA) cream Apply 1 application topically every Monday, Wednesday, and Friday. 01/01/16   Historical Provider, MD  losartan (COZAAR) 50 MG tablet Take 2 tablets (100 mg total) by mouth daily. 02/06/16 03/07/16  [name], MD  metroNIDAZOLE (FLAGYL) 500 MG tablet Take 1 tablet (500 mg total) by mouth 3 (three) times daily. 02/06/16   [name], MD  Omega-3 Krill Oil 500 MG CAPS Take 500 mg by mouth daily.     Historical Provider, MD  oxyCODONE (OXY IR/ROXICODONE) 5 MG immediate release tablet Take 1-2 tablets (5-10 mg total) by mouth every 4 (four) hours as needed for moderate pain. 01/31/16   Excell Seltzer, MD  oxyCODONE (OXY IR/ROXICODONE) 5 MG immediate release tablet Take 1-2 tablets (5-10 mg total) by mouth every 6 (six) hours as needed for moderate pain. 02/06/16   [name], MD  pravastatin (PRAVACHOL) 40 MG tablet Take 40 mg by mouth at bedtime.    Historical Provider, MD  sevelamer carbonate (RENVELA) 800 MG tablet Take 2,400 mg by mouth 3 (three) times daily with meals.    Historical Provider, MD  sodium bicarbonate 650 MG tablet Take 650 mg by mouth at bedtime.    Historical Provider, MD   BP 187/67 mmHg  Pulse 83  Temp(Src) 102.8 F (39.3 C) (Oral)  Resp 22  Ht $R'5\' 1"'Dh$  (1.549 m)  Wt 165 lb (74.844 kg)  BMI 31.19 kg/m2  SpO2 94% Physical Exam  Constitutional: She is oriented to person, place, and time. She appears well-developed and well-nourished.  HENT:  Head: Normocephalic and atraumatic.  Neck: Normal range of motion.  Cardiovascular: Normal rate and regular rhythm.   Pulmonary/Chest: No  stridor. Tachypnea noted. No respiratory distress. She has decreased breath sounds. She has rales (LUL).  Abdominal: She exhibits no distension.  Genitourinary: Guaiac negative stool. No vaginal discharge found.  Musculoskeletal: Normal range of motion. She exhibits no edema or tenderness.  Neurological: She is alert and oriented to person, place, and time. No cranial nerve deficit.  Skin: Skin is warm and dry. No erythema.  Nursing note and vitals reviewed.   ED Course  Procedures (including critical care time)  CRITICAL CARE Performed by: Merrily Pew  Total critical care time: 35 minutes Critical care time was exclusive of separately billable procedures and treating other patients. Critical care was necessary to treat or prevent imminent or  life-threatening deterioration. Critical care was time spent personally by me on the following activities: development of treatment plan with patient and/or surrogate as well as nursing, discussions with consultants, evaluation of patient's response to treatment, examination of patient, obtaining history from patient or surrogate, ordering and performing treatments and interventions, ordering and review of laboratory studies, ordering and review of radiographic studies, pulse oximetry and re-evaluation of patient's condition.  Labs Review Labs Reviewed  CBC WITH DIFFERENTIAL/PLATELET - Abnormal; Notable for the following:    WBC 11.1 (*)    RBC 3.17 (*)    Hemoglobin 9.6 (*)    HCT 29.9 (*)    Neutro Abs 10.0 (*)    Lymphs Abs 0.6 (*)    All other components within normal limits  COMPREHENSIVE METABOLIC PANEL - Abnormal; Notable for the following:    Chloride 98 (*)    Glucose, Bld 185 (*)    Creatinine, Ser 2.44 (*)    Calcium 8.4 (*)    GFR calc non Af Amer 22 (*)    GFR calc Af Amer 26 (*)    All other components within normal limits  I-STAT ARTERIAL BLOOD GAS, ED - Abnormal; Notable for the following:    pH, Arterial 7.480 (*)     pO2, Arterial 78.0 (*)    Bicarbonate 27.3 (*)    Acid-Base Excess 4.0 (*)    All other components within normal limits  BLOOD GAS, ARTERIAL  I-STAT CG4 LACTIC ACID, ED    Imaging Review Dg Chest 2 View  02/18/2016  CLINICAL DATA:  Cough, fever EXAM: CHEST  2 VIEW COMPARISON:  None. FINDINGS: There is mild bilateral interstitial prominence with prominence of the central pulmonary vasculature. There is no focal parenchymal opacity. There is no pleural effusion or pneumothorax. There is stable cardiomegaly. There is evidence of prior median sternotomy. Dual-lumen central venous catheter. The osseous structures are unremarkable. IMPRESSION: Cardiomegaly with mild pulmonary vascular congestion. Electronically Signed   By: Kathreen Devoid   On: 02/18/2016 13:12   I have personally reviewed and evaluated these images and lab results as part of my medical decision-making.   EKG Interpretation   Date/Time:  Wednesday Feb 18 2016 12:40:08 EDT Ventricular Rate:  84 PR Interval:  179 QRS Duration: 114 QT Interval:  441 QTC Calculation: 521 R Axis:   -40 Text Interpretation:  Sinus rhythm Abnormal R-wave progression, late  transition LVH with secondary repolarization abnormality Prolonged QT  interval Baseline wander in lead(s) II Confirmed by [facility] MD, Corene Cornea  [id number]) on 02/18/2016 1:08:44 PM      MDM   Final diagnoses:  HCAP (healthcare-associated pneumonia)  Acute respiratory failure with hypoxia ([place])    HCAP with oxygen requirement. Sepsis 2/2 sirs criteria. Vanc/zosyn administered. No hypotension or lactic acidemia to suggest severe sepsis or septic shock and cxr with likely fluid overload so will hold on large fluid boluses. Will admit to stepdown 2/2 dialysis, need for O2 and high fever.     Merrily Pew, MD 02/19/16 [phone]

## 2016-02-18 NOTE — H&P (Signed)
History and Physical    Isabel Sharp UVO:[id number] DOB: August 18, 1971 DOA: 02/18/2016  Referring MD/NP/PA: Dr. Marily Memos PCP: [name], PA-C Patient coming from: [facility]  Chief Complaint: Cough  HPI: Isabel Sharp is a 45 y.o. female with medical history significant of ESRD dialysis MWF, HTN, DM.  She was recently admitted to the hospital on 4/28-5/5 for acute appendicitis, underwent laproscopic appendectomy.  Post op course complicated by fever and AMS.  She was discharged on cipro/flagyl.  Dialysis access site was R sided catheter up until her vascular surgeon (at [place], see care everywhere for note) cleared her LUE fistula for use just yesterday.  Patient developed cough and SOB onset last evening, worsening and persistent today during dialysis.  She was sent to [facility] ED after completing dialysis session today.  ED Course: In the ED she was febrile to 103, tachypnic, new O2 requirement.  CXR suggests mild pulmonary vascular congestion, however she is being treated for presumed pneumonia.  Review of Systems: As per HPI otherwise 10 point review of systems negative.    Past Medical History  Diagnosis Date  . End-stage renal disease on hemodialysis ([place])   . 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 Coronary artery disease   . Carotid artery disease [facility])     Past Surgical History  Procedure Laterality Date  . Cesarean section    . Laparoscopic bilateral salpingo oopherectomy    . Cardiac catheterization  02/21/14    done at [facility]-  . Coronary artery bypass graft  02/24/14    essel coronary artery disease and had bypass grafting in May 2015 with a mammary to the LAD, a saphenous vein graft to the OM and a saphenous vein graft to the diagonal  . Carotid endarterectomy Left 04/30/2015  . Laparoscopic appendectomy N/A 01/30/2016    Procedure: APPENDECTOMY LAPAROSCOPIC;  Surgeon: Mickeal Skinner, MD;  Location: [place];  Service: General;  Laterality: N/A;       reports that she has never smoked. She has never used smokeless tobacco. She reports that she does not drink alcohol or use illicit drugs.  Allergies  Allergen Reactions  . Norvasc [Amlodipine] Swelling    Family History  Problem Relation Age of Onset  . Heart attack Mother   . Heart attack Father   . Heart attack Maternal Grandmother   . Heart attack Maternal Grandfather   . Hypertension Father      Prior to Admission medications   Medication Sig Start Date End Date Taking? Authorizing Provider  acetaminophen (TYLENOL) 325 MG tablet Take 325 mg by mouth 2 (two) times daily as needed (pain).    Historical Provider, MD  aspirin 325 MG tablet Take 325 mg by mouth daily.    Historical Provider, MD  bumetanide (BUMEX) 2 MG tablet Take 2 mg by mouth daily.    Historical Provider, MD  carvedilol (COREG) 25 MG tablet Take 25 mg by mouth 2 (two) times daily.     Historical Provider, MD  Cholecalciferol (VITAMIN D3) 1000 UNITS CAPS Take by mouth every morning.    Historical Provider, MD  ciprofloxacin (CIPRO) 250 MG tablet Take 1 tablet (250 mg total) by mouth daily. 02/06/16   Isabel Karlyne Greenspan, MD  diltiazem (CARDIZEM CD) 300 MG 24 hr capsule Take 1 capsule (300 mg total) by mouth 2 (two) times daily. 02/06/16   Isabel Karlyne Greenspan, MD  DOCOSAHEXAENOIC ACID PO Take 1 capsule by mouth every morning.    Historical Provider,  MD  docusate sodium (COLACE) 100 MG capsule Take 100 mg by mouth 2 (two) times daily.    Historical Provider, MD  epoetin alfa (EPOGEN,PROCRIT) 96295 UNIT/ML injection Inject 10,000 Units into the skin every Monday, Wednesday, and Friday.     Historical Provider, MD  Ferric Citrate (AURYXIA) 1 GM 210 MG(Fe) TABS Take 210 mg by mouth 3 (three) times daily with meals.     Historical Provider, MD  glipiZIDE (GLUCOTROL) 10 MG tablet Take 10 mg by mouth daily before breakfast.     Historical Provider, MD  hydrALAZINE (APRESOLINE) 100 MG tablet Take 100 mg by mouth 3 (three) times  daily.    Historical Provider, MD  insulin glargine (LANTUS) 100 UNIT/ML injection Inject 15 Units into the skin every morning. > 140    Historical Provider, MD  isosorbide dinitrate (ISORDIL) 5 MG tablet Take 1 tablet (5 mg total) by mouth 2 (two) times daily. 02/06/16   Isabel Karlyne Greenspan, MD  lidocaine-prilocaine (EMLA) cream Apply 1 application topically every Monday, Wednesday, and Friday. 01/01/16   Historical Provider, MD  losartan (COZAAR) 50 MG tablet Take 2 tablets (100 mg total) by mouth daily. 02/06/16 03/07/16  Isabel Karlyne Greenspan, MD  metroNIDAZOLE (FLAGYL) 500 MG tablet Take 1 tablet (500 mg total) by mouth 3 (three) times daily. 02/06/16   Isabel Karlyne Greenspan, MD  Omega-3 Krill Oil 500 MG CAPS Take 500 mg by mouth daily.     Historical Provider, MD  pravastatin (PRAVACHOL) 40 MG tablet Take 40 mg by mouth at bedtime.    Historical Provider, MD  sevelamer carbonate (RENVELA) 800 MG tablet Take 2,400 mg by mouth 3 (three) times daily with meals.    Historical Provider, MD  sodium bicarbonate 650 MG tablet Take 650 mg by mouth at bedtime.    Historical Provider, MD    Physical Exam: Filed Vitals:   02/18/16 1500 02/18/16 1552 02/18/16 1629 02/18/16 1832  BP:  190/72 186/74   Pulse: 77 82 80   Temp:    103 F (39.4 C)  TempSrc:    Oral  Resp: 29 31 40   Height:      Weight:      SpO2: 94% 99% 95%       Constitutional: NAD, calm, comfortable Filed Vitals:   02/18/16 1500 02/18/16 1552 02/18/16 1629 02/18/16 1832  BP:  190/72 186/74   Pulse: 77 82 80   Temp:    103 F (39.4 C)  TempSrc:    Oral  Resp: 29 31 40   Height:      Weight:      SpO2: 94% 99% 95%    Eyes: PERRL, lids and conjunctivae normal ENMT: Mucous membranes are moist. Posterior pharynx clear of any exudate or lesions.Normal dentition.  Neck: normal, supple, no masses, no thyromegaly Respiratory: clear to auscultation bilaterally, no wheezing, no crackles. Normal respiratory effort. No accessory muscle use.    Cardiovascular: Regular rate and rhythm, no murmurs / rubs / gallops. No extremity edema. 2+ pedal pulses. No carotid bruits.  Abdomen: no tenderness, no masses palpated. No hepatosplenomegaly. Bowel sounds positive.  Musculoskeletal: no clubbing / cyanosis. No joint deformity upper and lower extremities. Good ROM, no contractures. Normal muscle tone.  Skin: no rashes, lesions, ulcers. No induration Neurologic: CN 2-12 grossly intact. Sensation intact, DTR normal. Strength 5/5 in all 4.  Psychiatric: Normal judgment and insight. Alert and oriented x 3. Normal mood.    Labs on Admission: I have personally  reviewed following labs and imaging studies  CBC:  Recent Labs Lab 02/18/16 1245  WBC 11.1*  NEUTROABS 10.0*  HGB 9.6*  HCT 29.9*  MCV 94.3  PLT 062   Basic Metabolic Panel:  Recent Labs Lab 02/18/16 1245  NA 136  K 4.0  CL 98*  CO2 27  GLUCOSE 185*  BUN 11  CREATININE 2.44*  CALCIUM 8.4*   GFR: Estimated Creatinine Clearance: 26.4 mL/min (by C-G formula based on Cr of 2.44). Liver Function Tests:  Recent Labs Lab 02/18/16 1245  AST 20  ALT 16  ALKPHOS 93  BILITOT 0.8  PROT 7.7  ALBUMIN 3.5   No results for input(s): LIPASE, AMYLASE in the last 168 hours. No results for input(s): AMMONIA in the last 168 hours. Coagulation Profile: No results for input(s): INR, PROTIME in the last 168 hours. Cardiac Enzymes: No results for input(s): CKTOTAL, CKMB, CKMBINDEX, TROPONINI in the last 168 hours. BNP (last 3 results) No results for input(s): PROBNP in the last 8760 hours. HbA1C: No results for input(s): HGBA1C in the last 72 hours. CBG: No results for input(s): GLUCAP in the last 168 hours. Lipid Profile: No results for input(s): CHOL, HDL, LDLCALC, TRIG, CHOLHDL, LDLDIRECT in the last 72 hours. Thyroid Function Tests: No results for input(s): TSH, T4TOTAL, FREET4, T3FREE, THYROIDAB in the last 72 hours. Anemia Panel: No results for input(s): VITAMINB12,  FOLATE, FERRITIN, TIBC, IRON, RETICCTPCT in the last 72 hours. Urine analysis:    Component Value Date/Time   COLORURINE YELLOW 02/01/2016 1024   APPEARANCEUR CLEAR 02/01/2016 1024   LABSPEC 1.019 02/01/2016 1024   PHURINE 7.5 02/01/2016 1024   GLUCOSEU 100* 02/01/2016 1024   HGBUR NEGATIVE 02/01/2016 1024   BILIRUBINUR NEGATIVE 02/01/2016 1024   KETONESUR NEGATIVE 02/01/2016 1024   PROTEINUR >300* 02/01/2016 1024   NITRITE NEGATIVE 02/01/2016 1024   LEUKOCYTESUR NEGATIVE 02/01/2016 1024   Sepsis Labs: $RemoveBefo'@[email or web address]'HbhZGviBPyd$ (IRSWNIOEVOJJK:0,XFGHWEXHBZJ:6) )No results found for this or any previous visit (from the past 240 hour(s)).   Radiological Exams on Admission: Dg Chest 2 View  02/18/2016  CLINICAL DATA:  Cough, fever EXAM: CHEST  2 VIEW COMPARISON:  None. FINDINGS: There is mild bilateral interstitial prominence with prominence of the central pulmonary vasculature. There is no focal parenchymal opacity. There is no pleural effusion or pneumothorax. There is stable cardiomegaly. There is evidence of prior median sternotomy. Dual-lumen central venous catheter. The osseous structures are unremarkable. IMPRESSION: Cardiomegaly with mild pulmonary vascular congestion. Electronically Signed   By: Kathreen Devoid   On: 02/18/2016 13:12    EKG: Independently reviewed.  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  I do feel it is probably reasonable to treat this somewhat more broad spectrum than a CAP.  Will leave patient on zosyn / vanc per pharm for now  PNA pathway otherwise  Cultures pending  ESRD - last dialysis today, left message with dialysis consult ine  Does still have R catheter in place, no overt signs that this is source of  infection, but her LUE fistula is ready for use  If LUE fistula continues to work well, then likely will want to get the no-longer needed R catheter removed in short order.  HTN - continue home BP meds, add PRN IV BP  meds if needed  DM2 -  Reducing lantus to 10 units QHS  Sensitive scale SSI  Holding home POs   DVT prophylaxis: heparin [place] Code Status: Full Family Communication: no family in room Consults called: left message with dialysis consult line for routine inpatient dialysis Admission status: Admit to inpatient   Etta Quill DO [facility] Pager [phone] from 7PM-7AM  If 7AM-7PM, please contact the day physician for the patient [email or web address] Password [facility]  02/18/2016, 8:46 PM     '

## 2016-02-19 ENCOUNTER — Inpatient Hospital Stay (HOSPITAL_COMMUNITY): Payer: BLUE CROSS/BLUE SHIELD

## 2016-02-19 DIAGNOSIS — N186 End stage renal disease: Secondary | ICD-10-CM

## 2016-02-19 DIAGNOSIS — Z992 Dependence on renal dialysis: Secondary | ICD-10-CM

## 2016-02-19 DIAGNOSIS — Z794 Long term (current) use of insulin: Secondary | ICD-10-CM

## 2016-02-19 DIAGNOSIS — I10 Essential (primary) hypertension: Secondary | ICD-10-CM

## 2016-02-19 DIAGNOSIS — E1122 Type 2 diabetes mellitus with diabetic chronic kidney disease: Secondary | ICD-10-CM

## 2016-02-19 DIAGNOSIS — J189 Pneumonia, unspecified organism: Principal | ICD-10-CM

## 2016-02-19 LAB — BLOOD GAS, ARTERIAL
Acid-Base Excess: 1.9 mmol/L (ref 0.0–2.0)
Bicarbonate: 25.3 mEq/L — ABNORMAL HIGH (ref 20.0–24.0)
DRAWN BY: 345601
O2 Content: 3 L/min
O2 Saturation: 95.1 %
PH ART: 7.436 (ref 7.350–7.450)
Patient temperature: 102.5
TCO2: 26.4 mmol/L (ref 0–100)
pCO2 arterial: 39.1 mmHg (ref 35.0–45.0)
pO2, Arterial: 81.9 mmHg (ref 80.0–100.0)

## 2016-02-19 LAB — LACTIC ACID, PLASMA
LACTIC ACID, VENOUS: 1 mmol/L (ref 0.5–2.0)
LACTIC ACID, VENOUS: 0.7 mmol/L (ref 0.5–2.0)

## 2016-02-19 LAB — CBC
HEMATOCRIT: 27.9 % — AB (ref 36.0–46.0)
Hemoglobin: 8.7 g/dL — ABNORMAL LOW (ref 12.0–15.0)
MCH: 29.2 pg (ref 26.0–34.0)
MCHC: 31.2 g/dL (ref 30.0–36.0)
MCV: 93.6 fL (ref 78.0–100.0)
PLATELETS: 197 10*3/uL (ref 150–400)
RBC: 2.98 MIL/uL — AB (ref 3.87–5.11)
RDW: 15.4 % (ref 11.5–15.5)
WBC: 9.2 10*3/uL (ref 4.0–10.5)

## 2016-02-19 LAB — BASIC METABOLIC PANEL
Anion gap: 16 — ABNORMAL HIGH (ref 5–15)
BUN: 17 mg/dL (ref 6–20)
CALCIUM: 8.1 mg/dL — AB (ref 8.9–10.3)
CHLORIDE: 99 mmol/L — AB (ref 101–111)
CO2: 21 mmol/L — ABNORMAL LOW (ref 22–32)
CREATININE: 4 mg/dL — AB (ref 0.44–1.00)
GFR, EST AFRICAN AMERICAN: 14 mL/min — AB (ref >60)
GFR, EST NON AFRICAN AMERICAN: 12 mL/min — AB (ref >60)
Glucose, Bld: 215 mg/dL — ABNORMAL HIGH (ref 65–99)
Potassium: 4.2 mmol/L (ref 3.5–5.1)
SODIUM: 136 mmol/L (ref 135–145)

## 2016-02-19 LAB — STREP PNEUMONIAE URINARY ANTIGEN: Strep Pneumo Urinary Antigen: NEGATIVE

## 2016-02-19 LAB — GLUCOSE, CAPILLARY
GLUCOSE-CAPILLARY: 219 mg/dL — AB (ref 65–99)
Glucose-Capillary: 171 mg/dL — ABNORMAL HIGH (ref 65–99)
Glucose-Capillary: 213 mg/dL — ABNORMAL HIGH (ref 65–99)
Glucose-Capillary: 162 mg/dL — ABNORMAL HIGH (ref 65–99)
Glucose-Capillary: 128 mg/dL — ABNORMAL HIGH (ref 65–99)

## 2016-02-19 LAB — HIV ANTIBODY (ROUTINE TESTING W REFLEX): HIV SCREEN 4TH GENERATION: NONREACTIVE

## 2016-02-19 LAB — MRSA PCR SCREENING: MRSA by PCR: NEGATIVE

## 2016-02-19 MED ORDER — ALBUTEROL SULFATE (2.5 MG/3ML) 0.083% IN NEBU
INHALATION_SOLUTION | RESPIRATORY_TRACT | Status: AC
  Administered 2016-02-19: 2.5 mg
  Filled 2016-02-19: qty 3

## 2016-02-19 MED ORDER — METOPROLOL TARTRATE 5 MG/5ML IV SOLN
5.0000 mg | Freq: Once | INTRAVENOUS | Status: AC
  Administered 2016-02-19: 5 mg via INTRAVENOUS
  Filled 2016-02-19: qty 5

## 2016-02-19 MED ORDER — GUAIFENESIN 100 MG/5ML PO SOLN
5.0000 mL | ORAL | Status: DC | PRN
  Administered 2016-02-19 – 2016-02-21 (×4): 100 mg via ORAL
  Filled 2016-02-19 (×8): qty 5

## 2016-02-19 MED ORDER — HYDRALAZINE HCL 20 MG/ML IJ SOLN
10.0000 mg | Freq: Once | INTRAMUSCULAR | Status: AC
  Administered 2016-02-19: 10 mg via INTRAVENOUS
  Filled 2016-02-19: qty 1

## 2016-02-19 MED ORDER — LABETALOL HCL 5 MG/ML IV SOLN
5.0000 mg | INTRAVENOUS | Status: DC | PRN
  Administered 2016-02-19 – 2016-02-20 (×4): 5 mg via INTRAVENOUS
  Filled 2016-02-19 (×4): qty 4

## 2016-02-19 MED ORDER — ALBUTEROL SULFATE (2.5 MG/3ML) 0.083% IN NEBU: 2.5000 mg | INHALATION_SOLUTION | Freq: Four times a day (QID) | RESPIRATORY_TRACT | Status: DC | PRN

## 2016-02-19 MED ORDER — GUAIFENESIN ER 600 MG PO TB12
1200.0000 mg | ORAL_TABLET | Freq: Two times a day (BID) | ORAL | Status: DC
  Administered 2016-02-19 – 2016-02-22 (×6): 1200 mg via ORAL
  Filled 2016-02-19 (×6): qty 2

## 2016-02-19 MED ORDER — ACETAMINOPHEN 500 MG PO TABS
1000.0000 mg | ORAL_TABLET | Freq: Once | ORAL | Status: AC
  Administered 2016-02-19: 1000 mg via ORAL

## 2016-02-19 NOTE — Progress Notes (Signed)
Patient became ill on SDU with fever of 102.5 oral, tachypnea up to the 40s.  Repeat CXR, ABG, lactate, CBC/BMP ordered.  ABG actually dosent look that bad.  CXR is clear by radiologist read.  Suspect tachypnea is secondary to sepsis with fever:  Tylenol 1gm ordered and given to patient to try and treat fever  Neb treatment ordered  Code sepsis called and PCCM notified  They agree with no additional IVF at the moment given that she remains very hypertensive with systolics near 619.  Will actually treat with a small amount of labetalol at this time.  Source of infection remains somewhat unclear.  CXR dosent appear to show PNA, her catheter site remains a possibility, however doesn't show obvious signs of infection (pain, discharge, erythema, etc) at this time.

## 2016-02-19 NOTE — Consult Note (Signed)
Reason for Consult: To manage dialysis and dialysis related needs Referring Physician: Tiearra Colwell is an 45 y.o. female with past medical history significant for hypertension, diabetes mellitus, obesity, coronary artery disease as well as end-stage renal disease on dialysis since 2014. She is followed by [facility] and undergoes dialysis at [facility] on Mondays Wednesdays and Fridays. She did receive a full dialysis treatment yesterday. Her other past medical history is significant for a laparoscopic appendectomy done on April 28- not sure when discharged but presented to the emergency department with fever last evening felt to be of respiratory origin- started on fairly broad antibiotics of Zosyn and vancomycin and blood cultures are negative so far. Clinically, she feels better. She does have a tunneled dialysis catheter in place and very recently heard from the vascular surgeon that her fistula is cleared for use- does not use Wednesday because she was feeling so poorly   Dialyzes at [facility]-Monday Wednesday Friday EDW 165 pounds (75 kg). She rents 4 hours, blood flow rate 400 is on Aranesp 40 and received in Wednesday no vitamin D HD Bath 2K/2.5 calcium, Dialyzer standard, Heparin yes, 2500 bolus. Access has PermCath but left upper arm fistula is ready for use.  Past Medical History  Diagnosis Date  . End-stage renal disease on hemodialysis ([place])   . Diabetes mellitus with end stage renal disease ([place])   . Coronary artery disease   . Carotid artery disease [facility])     Past Surgical History  Procedure Laterality Date  . Cesarean section    . Laparoscopic bilateral salpingo oopherectomy    . Cardiac catheterization  02/21/14    done at [facility]-  . Coronary artery bypass graft  02/24/14    essel coronary artery disease and had bypass grafting in May 2015 with a mammary to the LAD, a saphenous vein graft to the OM and a saphenous vein graft to the diagonal  .  Carotid endarterectomy Left 04/30/2015  . Laparoscopic appendectomy N/A 01/30/2016    Procedure: APPENDECTOMY LAPAROSCOPIC;  Surgeon: Mickeal Skinner, MD;  Location: [facility] OR;  Service: General;  Laterality: N/A;    Family History  Problem Relation Age of Onset  . Heart attack Mother   . Heart attack Father   . Heart attack Maternal Grandmother   . Heart attack Maternal Grandfather   . Hypertension Father     Social History:  reports that she has never smoked. She has never used smokeless tobacco. She reports that she does not drink alcohol or use illicit drugs.  Allergies:  Allergies  Allergen Reactions  . Norvasc [Amlodipine] Swelling  . Vicks Blue Cough Lozenges [Menthol] Other (See Comments)    Makes her cough worse. She can you vicks vapor rub per pt    Medications: I have reviewed the patient's current medications.   Results for orders placed or performed during the hospital encounter of 02/18/16 (from the past 48 hour(s))  CBC with Differential     Status: Abnormal   Collection Time: 02/18/16 12:45 PM  Result Value Ref Range   WBC 11.1 (H) 4.0 - 10.5 K/uL   RBC 3.17 (L) 3.87 - 5.11 MIL/uL   Hemoglobin 9.6 (L) 12.0 - 15.0 g/dL   HCT 29.9 (L) 36.0 - 46.0 %   MCV 94.3 78.0 - 100.0 fL   MCH 30.3 26.0 - 34.0 pg   MCHC 32.1 30.0 - 36.0 g/dL   RDW 15.0 11.5 - 15.5 %   Platelets 287  150 - 400 K/uL   Neutrophils Relative % 90 %   Neutro Abs 10.0 (H) 1.7 - 7.7 K/uL   Lymphocytes Relative 5 %   Lymphs Abs 0.6 (L) 0.7 - 4.0 K/uL   Monocytes Relative 4 %   Monocytes Absolute 0.5 0.1 - 1.0 K/uL   Eosinophils Relative 1 %   Eosinophils Absolute 0.1 0.0 - 0.7 K/uL   Basophils Relative 0 %   Basophils Absolute 0.0 0.0 - 0.1 K/uL  Comprehensive metabolic panel     Status: Abnormal   Collection Time: 02/18/16 12:45 PM  Result Value Ref Range   Sodium 136 135 - 145 mmol/L   Potassium 4.0 3.5 - 5.1 mmol/L   Chloride 98 (L) 101 - 111 mmol/L   CO2 27 22 - 32 mmol/L    Glucose, Bld 185 (H) 65 - 99 mg/dL   BUN 11 6 - 20 mg/dL   Creatinine, Ser 2.44 (H) 0.44 - 1.00 mg/dL   Calcium 8.4 (L) 8.9 - 10.3 mg/dL   Total Protein 7.7 6.5 - 8.1 g/dL   Albumin 3.5 3.5 - 5.0 g/dL   AST 20 15 - 41 U/L   ALT 16 14 - 54 U/L   Alkaline Phosphatase 93 38 - 126 U/L   Total Bilirubin 0.8 0.3 - 1.2 mg/dL   GFR calc non Af Amer 22 (L) >60 mL/min   GFR calc Af Amer 26 (L) >60 mL/min    Comment: (NOTE) The eGFR has been calculated using the CKD EPI equation. This calculation has not been validated in all clinical situations. eGFR's persistently <60 mL/min signify possible Chronic Kidney Disease.    Anion gap 11 5 - 15  I-Stat CG4 Lactic Acid, ED     Status: None   Collection Time: 02/18/16  1:36 PM  Result Value Ref Range   Lactic Acid, Venous 1.79 0.5 - 2.0 mmol/L  I-Stat arterial blood gas, ED     Status: Abnormal   Collection Time: 02/18/16  2:56 PM  Result Value Ref Range   pH, Arterial 7.480 (H) 7.350 - 7.450   pCO2 arterial 37.3 35.0 - 45.0 mmHg   pO2, Arterial 78.0 (L) 80.0 - 100.0 mmHg   Bicarbonate 27.3 (H) 20.0 - 24.0 mEq/L   TCO2 28 0 - 100 mmol/L   O2 Saturation 95.0 %   Acid-Base Excess 4.0 (H) 0.0 - 2.0 mmol/L   Patient temperature 102.8 F    Collection site RADIAL, ALLEN'S TEST ACCEPTABLE    Drawn by RT    Sample type ARTERIAL   Glucose, capillary     Status: Abnormal   Collection Time: 02/18/16  9:39 PM  Result Value Ref Range   Glucose-Capillary 168 (H) 65 - 99 mg/dL  Culture, blood (routine x 2) Call MD if unable to obtain prior to antibiotics being given     Status: None (Preliminary result)   Collection Time: 02/18/16 10:09 PM  Result Value Ref Range   Specimen Description BLOOD RIGHT ANTECUBITAL    Special Requests IN PEDIATRIC BOTTLE 3CC    Culture NO GROWTH < 24 HOURS    Report Status PENDING   HIV antibody     Status: None   Collection Time: 02/18/16 10:17 PM  Result Value Ref Range   HIV Screen 4th Generation wRfx Non Reactive Non  Reactive    Comment: (NOTE) Performed At: [facility] [address] [place], [place] [id number] Lindon Romp MD YJ:[id number]   Culture, blood (routine x 2)  Call MD if unable to obtain prior to antibiotics being given     Status: None (Preliminary result)   Collection Time: 02/18/16 10:24 PM  Result Value Ref Range   Specimen Description BLOOD RIGHT HAND    Special Requests IN PEDIATRIC BOTTLE 1CC    Culture NO GROWTH < 24 HOURS    Report Status PENDING   MRSA PCR Screening     Status: None   Collection Time: 02/18/16 11:29 PM  Result Value Ref Range   MRSA by PCR NEGATIVE NEGATIVE    Comment:        The GeneXpert MRSA Assay (FDA approved for NASAL specimens only), is one component of a comprehensive MRSA colonization surveillance program. It is not intended to diagnose MRSA infection nor to guide or monitor treatment for MRSA infections.   Glucose, capillary     Status: Abnormal   Collection Time: 02/19/16  3:43 AM  Result Value Ref Range   Glucose-Capillary 219 (H) 65 - 99 mg/dL  Blood gas, arterial     Status: Abnormal   Collection Time: 02/19/16  3:48 AM  Result Value Ref Range   O2 Content 3.0 L/min   Delivery systems NASAL CANNULA    pH, Arterial 7.436 7.350 - 7.450   pCO2 arterial 39.1 35.0 - 45.0 mmHg   pO2, Arterial 81.9 80.0 - 100.0 mmHg   Bicarbonate 25.3 (H) 20.0 - 24.0 mEq/L   TCO2 26.4 0 - 100 mmol/L   Acid-Base Excess 1.9 0.0 - 2.0 mmol/L   O2 Saturation 95.1 %   Patient temperature 102.5    Collection site RIGHT RADIAL    Drawn by [phone]    Sample type ARTERIAL DRAW    Allens test (pass/fail) PASS PASS  CBC     Status: Abnormal   Collection Time: 02/19/16  3:56 AM  Result Value Ref Range   WBC 9.2 4.0 - 10.5 K/uL   RBC 2.98 (L) 3.87 - 5.11 MIL/uL   Hemoglobin 8.7 (L) 12.0 - 15.0 g/dL   HCT 27.9 (L) 36.0 - 46.0 %   MCV 93.6 78.0 - 100.0 fL   MCH 29.2 26.0 - 34.0 pg   MCHC 31.2 30.0 - 36.0 g/dL   RDW 15.4 11.5 - 15.5 %    Platelets 197 150 - 400 K/uL  Basic metabolic panel     Status: Abnormal   Collection Time: 02/19/16  3:56 AM  Result Value Ref Range   Sodium 136 135 - 145 mmol/L   Potassium 4.2 3.5 - 5.1 mmol/L   Chloride 99 (L) 101 - 111 mmol/L   CO2 21 (L) 22 - 32 mmol/L   Glucose, Bld 215 (H) 65 - 99 mg/dL   BUN 17 6 - 20 mg/dL   Creatinine, Ser 4.00 (H) 0.44 - 1.00 mg/dL   Calcium 8.1 (L) 8.9 - 10.3 mg/dL   GFR calc non Af Amer 12 (L) >60 mL/min   GFR calc Af Amer 14 (L) >60 mL/min    Comment: (NOTE) The eGFR has been calculated using the CKD EPI equation. This calculation has not been validated in all clinical situations. eGFR's persistently <60 mL/min signify possible Chronic Kidney Disease.    Anion gap 16 (H) 5 - 15  Lactic acid, plasma     Status: None   Collection Time: 02/19/16  3:56 AM  Result Value Ref Range   Lactic Acid, Venous 1.0 0.5 - 2.0 mmol/L  Lactic acid, plasma     Status: None  Collection Time: 02/19/16  7:24 AM  Result Value Ref Range   Lactic Acid, Venous 0.7 0.5 - 2.0 mmol/L  Glucose, capillary     Status: Abnormal   Collection Time: 02/19/16  7:54 AM  Result Value Ref Range   Glucose-Capillary 171 (H) 65 - 99 mg/dL  Strep pneumoniae urinary antigen     Status: None   Collection Time: 02/19/16 10:50 AM  Result Value Ref Range   Strep Pneumo Urinary Antigen NEGATIVE NEGATIVE    Comment:        Infection due to S. pneumoniae cannot be absolutely ruled out since the antigen present may be below the detection limit of the test.   Glucose, capillary     Status: Abnormal   Collection Time: 02/19/16 12:22 PM  Result Value Ref Range   Glucose-Capillary 213 (H) 65 - 99 mg/dL    Dg Chest 2 View  02/18/2016  CLINICAL DATA:  Cough, fever EXAM: CHEST  2 VIEW COMPARISON:  None. FINDINGS: There is mild bilateral interstitial prominence with prominence of the central pulmonary vasculature. There is no focal parenchymal opacity. There is no pleural effusion or  pneumothorax. There is stable cardiomegaly. There is evidence of prior median sternotomy. Dual-lumen central venous catheter. The osseous structures are unremarkable. IMPRESSION: Cardiomegaly with mild pulmonary vascular congestion. Electronically Signed   By: Kathreen Devoid   On: 02/18/2016 13:12   Dg Chest Port 1 View  02/19/2016  CLINICAL DATA:  Shortness of breath EXAM: PORTABLE CHEST 1 VIEW COMPARISON:  02/18/2016 FINDINGS: Postoperative changes in the mediastinum. Right central venous catheter with tip over the cavoatrial junction. Shallow inspiration with atelectasis in the lung bases. Cardiac enlargement. No pulmonary vascular congestion. No focal airspace disease or consolidation. No blunting of costophrenic angles. No pneumothorax. IMPRESSION: Cardiac enlargement without significant vascular congestion or edema. Shallow inspiration with atelectasis in the lung bases. Electronically Signed   By: Lucienne Capers M.D.   On: 02/19/2016 03:54    ROS: Abdominal incision is healing. She presented with just lethargy but feels better- denies cough Blood pressure 171/73, pulse 68, temperature 98.9 F (37.2 C), temperature source Oral, resp. rate 25, height _0  (1.575 m), weight 72.5 kg (159 lb 13.3 oz), SpO2 98 %. General appearance: alert and fatigued Eyes: conjunctivae/corneas clear. PERRL, EOM's intact. Fundi benign. Resp: diminished breath sounds bibasilar Cardio: regular rate and rhythm, S1, S2 normal, no murmur, click, rub or gallop GI: soft, non-tender; bowel sounds normal; no masses,  no organomegaly and Healing incision Extremities: extremities normal, atraumatic, no cyanosis or edema Has a left upper arm AV fistula with good thrill and bruit as well as a PermCath in place which is nontender  Assessment/Plan: 45 year old [place] female with medical issues including end-stage renal disease who presents with fever in the postoperative period after laparoscopic appendectomy 1 Fever- workup  per primary team. Felt to be respiratory origin. Started on vancomycin and Zosyn with clinical improvement. Blood cultures are negative to date 2 ESRD: Continue with HD Monday with a Friday, next treatment tomorrow. We will attempt using fistula with small needles.  I have the orders from her home dialysis unit  3 Hypertension: She seems to be on a number of blood pressure medications - blood pressure is high. It seems like she could use a lower dry weight  4. Anemia of ESRD: Hemoglobin decreased from in the nines to the eights- patient received her Aranesp on Wednesday so would not be due for another dose- continue to  follow  5. Metabolic Bone Disease: Continue home dose Renvela  Thank you for this consult. We will continue to follow with you   Isabel Sharp A 02/19/2016, 2:46 PM

## 2016-02-19 NOTE — Progress Notes (Signed)
[facility] PROGRESS NOTE  Isabel Sharp ZOX:[id number] DOB: [birth year] DOA: 02/18/2016  PCP: Sheral Apley  Brief HPI: 45 year old female of [place] origin with a past medical history of end-stage renal disease on hemodialysis on Monday, Wednesday, Friday, hypertension, diabetes, who was recently hospitalized for acute appendicitis and underwent laparoscopic appendectomy. She presented with complaints of cough and shortness of breath and was found to be febrile. Her symptoms suggestive of an infectious process. She was admitted to the hospital for further management.  Past medical history:  Past Medical History  Diagnosis Date  . End-stage renal disease on hemodialysis ([place])   . Diabetes mellitus with end stage renal disease ([place])   . Coronary artery disease   . Carotid artery disease [facility])     Consultants: Nephrology  Procedures: None  Antibiotics: Vancomycin and Zosyn  Subjective: Patient continues to have cough, but to no expectoration. Denies any chest pain. Some shortness of breath is present. No nausea or vomiting in the last few days.  Objective:  Vital Signs  Filed Vitals:   02/19/16 0530 02/19/16 0757 02/19/16 0849 02/19/16 1223  BP: 169/70 180/76  171/73  Pulse: 74 80  68  Temp:  99 F (37.2 C)  98.9 F (37.2 C)  TempSrc:  Oral  Oral  Resp: 33 25  25  Height:      Weight:      SpO2: 99% 99% 100% 100%    Intake/Output Summary (Last 24 hours) at 02/19/16 1245 Last data filed at 02/19/16 0507  Gross per 24 hour  Intake     50 ml  Output      0 ml  Net     50 ml   Filed Weights   02/18/16 1216 02/18/16 2100  Weight: 74.844 kg (165 lb) 72.5 kg (159 lb 13.3 oz)    General appearance: alert, cooperative, appears stated age and no distress Resp: Diminished air entry at the bases with crackles bilaterally. No wheezing. No rhonchi. Cardio: regular rate and rhythm, S1, S2 normal, no murmur, click, rub or gallop GI: soft, non-tender; bowel  sounds normal; no masses,  no organomegaly Extremities: extremities normal, atraumatic, no cyanosis or edema Neurologic: Awake and alert. Oriented 3. No focal neurological deficits are noted.  Lab Results:  Data Reviewed: I have personally reviewed following labs and imaging studies  CBC:  Recent Labs Lab 02/18/16 1245 02/19/16 0356  WBC 11.1* 9.2  NEUTROABS 10.0*  --   HGB 9.6* 8.7*  HCT 29.9* 27.9*  MCV 94.3 93.6  PLT 287 811   Basic Metabolic Panel:  Recent Labs Lab 02/18/16 1245 02/19/16 0356  NA 136 136  K 4.0 4.2  CL 98* 99*  CO2 27 21*  GLUCOSE 185* 215*  BUN 11 17  CREATININE 2.44* 4.00*  CALCIUM 8.4* 8.1*   GFR: Estimated Creatinine Clearance: 16.2 mL/min (by C-G formula based on Cr of 4).  Liver Function Tests:  Recent Labs Lab 02/18/16 1245  AST 20  ALT 16  ALKPHOS 93  BILITOT 0.8  PROT 7.7  ALBUMIN 3.5   CBG:  Recent Labs Lab 02/18/16 2139 02/19/16 0343 02/19/16 0754 02/19/16 1222  GLUCAP 168* 219* 171* 213*   Urine analysis:    Component Value Date/Time   COLORURINE YELLOW 02/01/2016 1024   APPEARANCEUR CLEAR 02/01/2016 1024   LABSPEC 1.019 02/01/2016 1024   PHURINE 7.5 02/01/2016 1024   GLUCOSEU 100* 02/01/2016 1024   HGBUR NEGATIVE 02/01/2016 1024   BILIRUBINUR NEGATIVE 02/01/2016 1024  KETONESUR NEGATIVE 02/01/2016 1024   PROTEINUR >300* 02/01/2016 1024   NITRITE NEGATIVE 02/01/2016 1024   LEUKOCYTESUR NEGATIVE 02/01/2016 1024    Recent Results (from the past 240 hour(s))  MRSA PCR Screening     Status: None   Collection Time: 02/18/16 11:29 PM  Result Value Ref Range Status   MRSA by PCR NEGATIVE NEGATIVE Final    Comment:        The GeneXpert MRSA Assay (FDA approved for NASAL specimens only), is one component of a comprehensive MRSA colonization surveillance program. It is not intended to diagnose MRSA infection nor to guide or monitor treatment for MRSA infections.       Radiology Studies: Dg Chest  2 View  02/18/2016  CLINICAL DATA:  Cough, fever EXAM: CHEST  2 VIEW COMPARISON:  None. FINDINGS: There is mild bilateral interstitial prominence with prominence of the central pulmonary vasculature. There is no focal parenchymal opacity. There is no pleural effusion or pneumothorax. There is stable cardiomegaly. There is evidence of prior median sternotomy. Dual-lumen central venous catheter. The osseous structures are unremarkable. IMPRESSION: Cardiomegaly with mild pulmonary vascular congestion. Electronically Signed   By: Elige Ko   On: 02/18/2016 13:12   Dg Chest Port 1 View  02/19/2016  CLINICAL DATA:  Shortness of breath EXAM: PORTABLE CHEST 1 VIEW COMPARISON:  02/18/2016 FINDINGS: Postoperative changes in the mediastinum. Right central venous catheter with tip over the cavoatrial junction. Shallow inspiration with atelectasis in the lung bases. Cardiac enlargement. No pulmonary vascular congestion. No focal airspace disease or consolidation. No blunting of costophrenic angles. No pneumothorax. IMPRESSION: Cardiac enlargement without significant vascular congestion or edema. Shallow inspiration with atelectasis in the lung bases. Electronically Signed   By: Burman Nieves M.D.   On: 02/19/2016 03:54     Medications:  Scheduled: . aspirin  325 mg Oral Daily  . bumetanide  2 mg Oral Daily  . carvedilol  25 mg Oral BID WC  . diltiazem  300 mg Oral BID  . docusate sodium  100 mg Oral BID  . Ferric Citrate  210 mg Oral TID WC  . guaiFENesin  1,200 mg Oral BID  . heparin  5,000 Units Subcutaneous Q8H  . hydrALAZINE  100 mg Oral TID  . insulin aspart  0-9 Units Subcutaneous TID WC  . insulin glargine  10 Units Subcutaneous QHS  . ipratropium-albuterol  3 mL Nebulization TID  . isosorbide dinitrate  5 mg Oral BID  . losartan  100 mg Oral Daily  . omega-3 acid ethyl esters  1 g Oral Daily  . piperacillin-tazobactam (ZOSYN)  IV  2.25 g Intravenous Q8H  . pravastatin  40 mg Oral QHS  .  sevelamer carbonate  2,400 mg Oral TID WC  . sodium bicarbonate  650 mg Oral QHS  . 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 However, her symptoms are suggestive of a respiratory tract infection. Due to the recent hospitalization as well as need for dialysis, she was placed on broad-spectrum coverage. Continue vancomycin and Zosyn for now. Await culture data. Mucinex. Nebulizer treatments as needed.  Lactic acid levels are normal. Strep pneumonia antigen is negative.  End-stage renal disease on hemodialysis Nephrology has been consulted. She did undergo outpatient dialysis yesterday. She is on a Monday, Wednesday, Friday schedule. Patient has a left upper extremity fistula, which has been approved for use. She continues to have a right-sided dialysis catheter as well.  Essential hypertension. Appears to be poorly controlled. Continue home medications for now. When necessary hydralazine.  Diabetes mellitus type 2. Continue Lantus and sliding scale coverage. Monitor CBGs.  Normocytic anemia likely due to chronic disease Monitor hemoglobin closely. No evidence for overt bleeding.   DVT Prophylaxis: Subcutaneous heparin    Code Status: Full code  Family Communication: Discussed with the patient  Disposition Plan: Continue management as outlined above. Mobilize as tolerated. Remain in step down for today.    LOS: 1 day   [place] [facility] Pager [phone] 02/19/2016, 12:45 PM  If 7PM-7AM, please contact  night-coverage at [email or web address], password [facility]

## 2016-02-20 LAB — CBC
HEMATOCRIT: 24.4 % — AB (ref 36.0–46.0)
HEMATOCRIT: 23 % — AB (ref 36.0–46.0)
HEMOGLOBIN: 7.5 g/dL — AB (ref 12.0–15.0)
HEMOGLOBIN: 7.2 g/dL — AB (ref 12.0–15.0)
MCH: 28.5 pg (ref 26.0–34.0)
MCH: 29.1 pg (ref 26.0–34.0)
MCHC: 30.7 g/dL (ref 30.0–36.0)
MCHC: 31.3 g/dL (ref 30.0–36.0)
MCV: 92.8 fL (ref 78.0–100.0)
MCV: 93.1 fL (ref 78.0–100.0)
Platelets: 208 10*3/uL (ref 150–400)
Platelets: 192 10*3/uL (ref 150–400)
RBC: 2.63 MIL/uL — ABNORMAL LOW (ref 3.87–5.11)
RBC: 2.47 MIL/uL — AB (ref 3.87–5.11)
RDW: 15.4 % (ref 11.5–15.5)
RDW: 15.4 % (ref 11.5–15.5)
WBC: 6.7 10*3/uL (ref 4.0–10.5)
WBC: 6.4 10*3/uL (ref 4.0–10.5)

## 2016-02-20 LAB — BASIC METABOLIC PANEL
ANION GAP: 13 (ref 5–15)
BUN: 31 mg/dL — ABNORMAL HIGH (ref 6–20)
CALCIUM: 8.4 mg/dL — AB (ref 8.9–10.3)
CHLORIDE: 103 mmol/L (ref 101–111)
CO2: 26 mmol/L (ref 22–32)
Creatinine, Ser: 6.19 mg/dL — ABNORMAL HIGH (ref 0.44–1.00)
GFR calc Af Amer: 8 mL/min — ABNORMAL LOW (ref >60)
GFR calc non Af Amer: 7 mL/min — ABNORMAL LOW (ref >60)
GLUCOSE: 95 mg/dL (ref 65–99)
POTASSIUM: 4.2 mmol/L (ref 3.5–5.1)
Sodium: 142 mmol/L (ref 135–145)

## 2016-02-20 LAB — GLUCOSE, CAPILLARY
Glucose-Capillary: 107 mg/dL — ABNORMAL HIGH (ref 65–99)
Glucose-Capillary: 89 mg/dL (ref 65–99)
Glucose-Capillary: 263 mg/dL — ABNORMAL HIGH (ref 65–99)
Glucose-Capillary: 156 mg/dL — ABNORMAL HIGH (ref 65–99)

## 2016-02-20 LAB — RENAL FUNCTION PANEL
ALBUMIN: 2.6 g/dL — AB (ref 3.5–5.0)
ANION GAP: 14 (ref 5–15)
BUN: 32 mg/dL — ABNORMAL HIGH (ref 6–20)
CALCIUM: 8.2 mg/dL — AB (ref 8.9–10.3)
CO2: 25 mmol/L (ref 22–32)
Chloride: 101 mmol/L (ref 101–111)
Creatinine, Ser: 6.36 mg/dL — ABNORMAL HIGH (ref 0.44–1.00)
GFR, EST AFRICAN AMERICAN: 8 mL/min — AB (ref >60)
GFR, EST NON AFRICAN AMERICAN: 7 mL/min — AB (ref >60)
Glucose, Bld: 102 mg/dL — ABNORMAL HIGH (ref 65–99)
PHOSPHORUS: 5.5 mg/dL — AB (ref 2.5–4.6)
POTASSIUM: 4 mmol/L (ref 3.5–5.1)
Sodium: 140 mmol/L (ref 135–145)

## 2016-02-20 MED ORDER — LIDOCAINE-PRILOCAINE 2.5-2.5 % EX CREA
1.0000 "application " | TOPICAL_CREAM | CUTANEOUS | Status: DC | PRN
  Filled 2016-02-20: qty 5

## 2016-02-20 MED ORDER — CLONIDINE HCL 0.1 MG PO TABS
0.1000 mg | ORAL_TABLET | Freq: Two times a day (BID) | ORAL | Status: DC
  Administered 2016-02-20 (×2): 0.1 mg via ORAL
  Filled 2016-02-20 (×2): qty 1

## 2016-02-20 MED ORDER — SODIUM CHLORIDE 0.9 % IV SOLN: 100.0000 mL | INTRAVENOUS | Status: DC | PRN

## 2016-02-20 MED ORDER — LIDOCAINE HCL (PF) 1 % IJ SOLN: 5.0000 mL | INTRAMUSCULAR | Status: DC | PRN

## 2016-02-20 MED ORDER — HEPARIN SODIUM (PORCINE) 1000 UNIT/ML DIALYSIS
20.0000 [IU]/kg | INTRAMUSCULAR | Status: DC | PRN
  Filled 2016-02-20: qty 2

## 2016-02-20 MED ORDER — PENTAFLUOROPROP-TETRAFLUOROETH EX AERO: 1.0000 "application " | INHALATION_SPRAY | CUTANEOUS | Status: DC | PRN

## 2016-02-20 MED ORDER — VANCOMYCIN HCL IN DEXTROSE 750-5 MG/150ML-% IV SOLN
INTRAVENOUS | Status: AC
  Administered 2016-02-20: 750 mg via INTRAVENOUS
  Filled 2016-02-20: qty 150

## 2016-02-20 MED ORDER — ALTEPLASE 2 MG IJ SOLR: 2.0000 mg | Freq: Once | INTRAMUSCULAR | Status: DC | PRN

## 2016-02-20 MED ORDER — HEPARIN SODIUM (PORCINE) 1000 UNIT/ML DIALYSIS
1000.0000 [IU] | INTRAMUSCULAR | Status: DC | PRN
  Filled 2016-02-20: qty 1

## 2016-02-20 MED ORDER — ISOSORBIDE DINITRATE 10 MG PO TABS
10.0000 mg | ORAL_TABLET | Freq: Three times a day (TID) | ORAL | Status: DC
  Administered 2016-02-20 – 2016-02-22 (×6): 10 mg via ORAL
  Filled 2016-02-20 (×9): qty 1

## 2016-02-20 NOTE — Progress Notes (Signed)
Leaving floor to hemodialysis.

## 2016-02-20 NOTE — Procedures (Signed)
I was present at this dialysis session. I have reviewed the session itself and made appropriate changes.   Tolerating. Successful 17g x2 cannulation Qb 250.  2K 3.5Ca bath.  Hb at 7.2,  rec ESA 5/17. FOllow, transfuse if < 7.  Resp Sx improved.    Recent Labs Lab 02/20/16 0848  NA 140  K 4.0  CL 101  CO2 25  GLUCOSE 102*  BUN 32*  CREATININE 6.36*  CALCIUM 8.2*  PHOS 5.5*     Recent Labs Lab 02/18/16 1245 02/19/16 0356 02/20/16 0549 02/20/16 0849  WBC 11.1* 9.2 6.7 6.4  NEUTROABS 10.0*  --   --   --   HGB 9.6* 8.7* 7.5* 7.2*  HCT 29.9* 27.9* 24.4* 23.0*  MCV 94.3 93.6 92.8 93.1  PLT 287 197 208 192    Scheduled Meds: . aspirin  325 mg Oral Daily  . bumetanide  2 mg Oral Daily  . carvedilol  25 mg Oral BID WC  . diltiazem  300 mg Oral BID  . docusate sodium  100 mg Oral BID  . guaiFENesin  1,200 mg Oral BID  . heparin  5,000 Units Subcutaneous Q8H  . hydrALAZINE  100 mg Oral TID  . insulin aspart  0-9 Units Subcutaneous TID WC  . insulin glargine  10 Units Subcutaneous QHS  . ipratropium-albuterol  3 mL Nebulization TID  . isosorbide dinitrate  5 mg Oral BID  . losartan  100 mg Oral Daily  . omega-3 acid ethyl esters  1 g Oral Daily  . piperacillin-tazobactam (ZOSYN)  IV  2.25 g Intravenous Q8H  . pravastatin  40 mg Oral QHS  . sevelamer carbonate  2,400 mg Oral TID WC  . sodium bicarbonate  650 mg Oral QHS  . vancomycin  750 mg Intravenous Q M,W,F-HD   Continuous Infusions:  PRN Meds:.sodium chloride, sodium chloride, acetaminophen, albuterol, alteplase, guaiFENesin, heparin, heparin, labetalol, lidocaine (PF), lidocaine-prilocaine, pentafluoroprop-tetrafluoroeth, polyethylene glycol   Pearson Grippe  MD 02/20/2016, 9:40 AM

## 2016-02-20 NOTE — Progress Notes (Signed)
[facility] PROGRESS NOTE  Isabel Sharp [id number] DOB: 06-Nov-1970 DOA: 02/18/2016  PCP: Sheral Apley  Brief HPI: 45 year old female of [place] origin with a past medical history of end-stage renal disease on hemodialysis on Monday, Wednesday, Friday, hypertension, diabetes, who was recently hospitalized for acute appendicitis and underwent laparoscopic appendectomy. She presented with complaints of cough and shortness of breath and was found to be febrile. Her symptoms suggestive of an infectious process. She was admitted to the hospital for further management.  Past medical history:  Past Medical History  Diagnosis Date  . End-stage renal disease on hemodialysis ([place])   . Diabetes mellitus with end stage renal disease ([place])   . Coronary artery disease   . Carotid artery disease [facility])     Consultants: Nephrology  Procedures: None  Antibiotics: Vancomycin and Zosyn  Subjective: Patient feels better this morning. States that her cough has been dry. Denies any chest pain. Shortness of breath has improved. No nausea or vomiting.   Objective:  Vital Signs  Filed Vitals:   02/20/16 0033 02/20/16 0300 02/20/16 0400 02/20/16 0401  BP: 177/73 158/70 165/69   Pulse: 76 70  66  Temp:    99.2 F (37.3 C)  TempSrc:    Oral  Resp: 17 32  23  Height:      Weight:      SpO2: 100% 100% 100% 100%    Intake/Output Summary (Last 24 hours) at 02/20/16 0736 Last data filed at 02/20/16 0552  Gross per 24 hour  Intake    150 ml  Output      0 ml  Net    150 ml   Filed Weights   02/18/16 1216 02/18/16 2100  Weight: 74.844 kg (165 lb) 72.5 kg (159 lb 13.3 oz)    General appearance: alert, cooperative, appears stated age and no distress Resp: Improving air entry bilaterally. Crackles present, but less than before. No wheezing. No rhonchi.  Cardio: regular rate and rhythm, S1, S2 normal, no murmur, click, rub or gallop GI: soft, non-tender; bowel sounds  normal; no masses,  no organomegaly Neurologic: Awake and alert. Oriented 3. No focal neurological deficits are noted.  Lab Results:  Data Reviewed: I have personally reviewed following labs and imaging studies  CBC:  Recent Labs Lab 02/18/16 1245 02/19/16 0356 02/20/16 0549  WBC 11.1* 9.2 6.7  NEUTROABS 10.0*  --   --   HGB 9.6* 8.7* 7.5*  HCT 29.9* 27.9* 24.4*  MCV 94.3 93.6 92.8  PLT 287 197 122   Basic Metabolic Panel:  Recent Labs Lab 02/18/16 1245 02/19/16 0356 02/20/16 0549  NA 136 136 142  K 4.0 4.2 4.2  CL 98* 99* 103  CO2 27 21* 26  GLUCOSE 185* 215* 95  BUN 11 17 PENDING  CREATININE 2.44* 4.00* PENDING  CALCIUM 8.4* 8.1* 8.4*   GFR: CrCl cannot be calculated (Patient has no serum creatinine result on file.).  Liver Function Tests:  Recent Labs Lab 02/18/16 1245  AST 20  ALT 16  ALKPHOS 93  BILITOT 0.8  PROT 7.7  ALBUMIN 3.5   CBG:  Recent Labs Lab 02/19/16 0343 02/19/16 0754 02/19/16 1222 02/19/16 1653 02/19/16 2136  GLUCAP 219* 171* 213* 162* 128*   Urine analysis:    Component Value Date/Time   COLORURINE YELLOW 02/01/2016 1024   APPEARANCEUR CLEAR 02/01/2016 1024   LABSPEC 1.019 02/01/2016 1024   PHURINE 7.5 02/01/2016 1024   GLUCOSEU 100* 02/01/2016 1024   HGBUR NEGATIVE  02/01/2016 1024   BILIRUBINUR NEGATIVE 02/01/2016 1024   KETONESUR NEGATIVE 02/01/2016 1024   PROTEINUR >300* 02/01/2016 1024   NITRITE NEGATIVE 02/01/2016 1024   LEUKOCYTESUR NEGATIVE 02/01/2016 1024    Recent Results (from the past 240 hour(s))  Culture, blood (routine x 2) Call MD if unable to obtain prior to antibiotics being given     Status: None (Preliminary result)   Collection Time: 02/18/16 10:09 PM  Result Value Ref Range Status   Specimen Description BLOOD RIGHT ANTECUBITAL  Final   Special Requests IN PEDIATRIC BOTTLE 3CC  Final   Culture NO GROWTH < 24 HOURS  Final   Report Status PENDING  Incomplete  Culture, blood (routine x 2) Call  MD if unable to obtain prior to antibiotics being given     Status: None (Preliminary result)   Collection Time: 02/18/16 10:24 PM  Result Value Ref Range Status   Specimen Description BLOOD RIGHT HAND  Final   Special Requests IN PEDIATRIC BOTTLE [place]  Final   Culture NO GROWTH < 24 HOURS  Final   Report Status PENDING  Incomplete  MRSA PCR Screening     Status: None   Collection Time: 02/18/16 11:29 PM  Result Value Ref Range Status   MRSA by PCR NEGATIVE NEGATIVE Final    Comment:        The GeneXpert MRSA Assay (FDA approved for NASAL specimens only), is one component of a comprehensive MRSA colonization surveillance program. It is not intended to diagnose MRSA infection nor to guide or monitor treatment for MRSA infections.       Radiology Studies: Dg Chest 2 View  02/18/2016  CLINICAL DATA:  Cough, fever EXAM: CHEST  2 VIEW COMPARISON:  None. FINDINGS: There is mild bilateral interstitial prominence with prominence of the central pulmonary vasculature. There is no focal parenchymal opacity. There is no pleural effusion or pneumothorax. There is stable cardiomegaly. There is evidence of prior median sternotomy. Dual-lumen central venous catheter. The osseous structures are unremarkable. IMPRESSION: Cardiomegaly with mild pulmonary vascular congestion. Electronically Signed   By: Kathreen Devoid   On: 02/18/2016 13:12   Dg Chest Port 1 View  02/19/2016  CLINICAL DATA:  Shortness of breath EXAM: PORTABLE CHEST 1 VIEW COMPARISON:  02/18/2016 FINDINGS: Postoperative changes in the mediastinum. Right central venous catheter with tip over the cavoatrial junction. Shallow inspiration with atelectasis in the lung bases. Cardiac enlargement. No pulmonary vascular congestion. No focal airspace disease or consolidation. No blunting of costophrenic angles. No pneumothorax. IMPRESSION: Cardiac enlargement without significant vascular congestion or edema. Shallow inspiration with atelectasis in the  lung bases. Electronically Signed   By: Lucienne Capers M.D.   On: 02/19/2016 03:54     Medications:  Scheduled: . aspirin  325 mg Oral Daily  . bumetanide  2 mg Oral Daily  . carvedilol  25 mg Oral BID WC  . diltiazem  300 mg Oral BID  . docusate sodium  100 mg Oral BID  . guaiFENesin  1,200 mg Oral BID  . heparin  5,000 Units Subcutaneous Q8H  . hydrALAZINE  100 mg Oral TID  . insulin aspart  0-9 Units Subcutaneous TID WC  . insulin glargine  10 Units Subcutaneous QHS  . ipratropium-albuterol  3 mL Nebulization TID  . isosorbide dinitrate  5 mg Oral BID  . losartan  100 mg Oral Daily  . omega-3 acid ethyl esters  1 g Oral Daily  . piperacillin-tazobactam (ZOSYN)  IV  2.25 g  Intravenous Q8H  . pravastatin  40 mg Oral QHS  . sevelamer carbonate  2,400 mg Oral TID WC  . sodium bicarbonate  650 mg Oral QHS  . 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 However, her symptoms were suggestive of a respiratory tract infection. Due to the recent hospitalization as well as need for dialysis, she was placed on broad-spectrum coverage. Continue vancomycin and Zosyn for now. Cultures are negative so far. Lactic acid level is normal. Continue Mucinex and Robitussin for now. Strep pneumonia antigen is negative. Plan will be to de-escalate antibiotics tomorrow.  End-stage renal disease on hemodialysis Nephrology has been consulted. Plan is for dialysis today.  She is on a Monday, Wednesday, Friday schedule. Patient has a left upper extremity fistula, which has been approved for use.  She continues to have a right-sided dialysis catheter as well.  Essential hypertension. Her pressure remains poorly controlled. She is on multiple antihypertensive agents. She is on maximal doses of most of his medications. We will increase the dose of her isosorbide dinitrate. May need to add clonidine. Continue as needed hydralazine and labetalol.   Diabetes mellitus type 2. Continue Lantus and sliding scale coverage. Monitor CBGs.  Normocytic anemia likely due to chronic disease Drop in hemoglobin noted. Anemia is likely secondary to chronic disease. No evidence for overt bleeding. Transfuse if hemoglobin drops less than 7.    DVT Prophylaxis: Subcutaneous heparin    Code Status: Full code  Family Communication: Discussed with the patient  Disposition Plan: Continue management as outlined above. Mobilize as tolerated. Transfer to floor after dialysis.    LOS: 2 days   [facility] Pager [phone] 02/20/2016, 7:36 AM  If 7PM-7AM, please contact night-coverage at [email or web address], password [facility]

## 2016-02-21 LAB — CBC
HCT: 23.9 % — ABNORMAL LOW (ref 36.0–46.0)
Hemoglobin: 7.4 g/dL — ABNORMAL LOW (ref 12.0–15.0)
MCH: 28.8 pg (ref 26.0–34.0)
MCHC: 31 g/dL (ref 30.0–36.0)
MCV: 93 fL (ref 78.0–100.0)
PLATELETS: 206 10*3/uL (ref 150–400)
RBC: 2.57 MIL/uL — ABNORMAL LOW (ref 3.87–5.11)
RDW: 15 % (ref 11.5–15.5)
WBC: 5.8 10*3/uL (ref 4.0–10.5)

## 2016-02-21 LAB — BASIC METABOLIC PANEL
ANION GAP: 13 (ref 5–15)
BUN: 20 mg/dL (ref 6–20)
CALCIUM: 8.4 mg/dL — AB (ref 8.9–10.3)
CO2: 26 mmol/L (ref 22–32)
CREATININE: 4.4 mg/dL — AB (ref 0.44–1.00)
Chloride: 95 mmol/L — ABNORMAL LOW (ref 101–111)
GFR, EST AFRICAN AMERICAN: 13 mL/min — AB (ref >60)
GFR, EST NON AFRICAN AMERICAN: 11 mL/min — AB (ref >60)
GLUCOSE: 128 mg/dL — AB (ref 65–99)
Potassium: 4.1 mmol/L (ref 3.5–5.1)
Sodium: 134 mmol/L — ABNORMAL LOW (ref 135–145)

## 2016-02-21 LAB — GLUCOSE, CAPILLARY
GLUCOSE-CAPILLARY: 229 mg/dL — AB (ref 65–99)
Glucose-Capillary: 139 mg/dL — ABNORMAL HIGH (ref 65–99)
Glucose-Capillary: 237 mg/dL — ABNORMAL HIGH (ref 65–99)
Glucose-Capillary: 150 mg/dL — ABNORMAL HIGH (ref 65–99)

## 2016-02-21 MED ORDER — LEVOFLOXACIN IN D5W 500 MG/100ML IV SOLN: 500.0000 mg | INTRAVENOUS | Status: DC

## 2016-02-21 MED ORDER — CLONIDINE HCL 0.1 MG PO TABS
0.1000 mg | ORAL_TABLET | Freq: Two times a day (BID) | ORAL | Status: DC
  Administered 2016-02-21 – 2016-02-22 (×3): 0.1 mg via ORAL
  Filled 2016-02-21 (×3): qty 1

## 2016-02-21 MED ORDER — LEVOFLOXACIN IN D5W 750 MG/150ML IV SOLN
750.0000 mg | Freq: Once | INTRAVENOUS | Status: AC
  Administered 2016-02-21: 750 mg via INTRAVENOUS
  Filled 2016-02-21: qty 150

## 2016-02-21 MED ORDER — IPRATROPIUM-ALBUTEROL 0.5-2.5 (3) MG/3ML IN SOLN
3.0000 mL | Freq: Four times a day (QID) | RESPIRATORY_TRACT | Status: DC | PRN
  Administered 2016-02-22: 3 mL via RESPIRATORY_TRACT
  Filled 2016-02-21: qty 3

## 2016-02-21 NOTE — Progress Notes (Signed)
Pharmacy Antibiotic Note  Isabel Sharp is a 45 y.o. female admitted on 02/18/2016 with pneumonia.  Pharmacy has been consulted for Levofloxacin dosing.  45yo female with ESRD-MWF dialysis admitted with SOB and cough, now narrowing antibiotics  Plan: Levofloxacin 750mg  IV x 1, then 500mg  IV q48   Height: 5\' 2"  (157.5 cm) Weight: 156 lb 8.4 oz (71 kg) IBW/kg (Calculated) : 50.1  Temp (24hrs), Avg:98.5 F (36.9 C), Min:98 F (36.7 C), Max:99.2 F (37.3 C)   Recent Labs Lab 02/18/16 1245 02/18/16 1336 02/19/16 0356 02/19/16 0724 02/20/16 0549 02/20/16 0848 02/20/16 0849 02/21/16 0231  WBC 11.1*  --  9.2  --  6.7  --  6.4 5.8  CREATININE 2.44*  --  4.00*  --  6.19* 6.36*  --  4.40*  LATICACIDVEN  --  1.79 1.0 0.7  --   --   --   --     Estimated Creatinine Clearance: 14.6 mL/min (by C-G formula based on Cr of 4.4).    Allergies  Allergen Reactions  . Norvasc [Amlodipine] Swelling  . Vicks Blue Cough Lozenges [Menthol] Other (See Comments)    Makes her cough worse. She can you vicks vapor rub per pt    Antimicrobials this admission: Zosyn 5/17>> 5/20 Vanc 5/17>> 5/20 Levofloxacin 5/20 >>  Microbiology results: 5/17 BCx: ntd  5/17 sputum: ntd   Thank you for allowing pharmacy to be a part of this patient's care.  Gracy Bruins, PharmD Clinical Pharmacist [facility]

## 2016-02-21 NOTE — Progress Notes (Signed)
Attempted report 

## 2016-02-21 NOTE — Progress Notes (Signed)
Admit: 02/18/2016 LOS: 3  27F ESRD [facility] MWF admit with CAP, recent appendectomy  Subjective:  B Cx 5/17 NGTD x2 HD Yesterday, 17g x2 in AVF, uneventful:  3L UF AFebrile Started levofloxacin  05/19 0701 - 05/20 0700 In: 100 [IV Piggyback:100] Out: 3000   Filed Weights   02/18/16 2100 02/20/16 0837 02/20/16 1237  Weight: 72.5 kg (159 lb 13.3 oz) 74 kg (163 lb 2.3 oz) 71 kg (156 lb 8.4 oz)    Scheduled Meds: . aspirin  325 mg Oral Daily  . bumetanide  2 mg Oral Daily  . carvedilol  25 mg Oral BID WC  . cloNIDine  0.1 mg Oral BID  . diltiazem  300 mg Oral BID  . docusate sodium  100 mg Oral BID  . guaiFENesin  1,200 mg Oral BID  . heparin  5,000 Units Subcutaneous Q8H  . hydrALAZINE  100 mg Oral TID  . insulin aspart  0-9 Units Subcutaneous TID WC  . insulin glargine  10 Units Subcutaneous QHS  . ipratropium-albuterol  3 mL Nebulization TID  . isosorbide dinitrate  10 mg Oral TID  . [START ON 02/23/2016] levofloxacin (LEVAQUIN) IV  500 mg Intravenous Q48H  . levofloxacin (LEVAQUIN) IV  750 mg Intravenous Once  . losartan  100 mg Oral Daily  . omega-3 acid ethyl esters  1 g Oral Daily  . pravastatin  40 mg Oral QHS  . sevelamer carbonate  2,400 mg Oral TID WC  . sodium bicarbonate  650 mg Oral QHS   Continuous Infusions:  PRN Meds:.acetaminophen, albuterol, guaiFENesin, labetalol, polyethylene glycol  Current Labs: reviewed    Physical Exam:  Blood pressure 176/72, pulse 64, temperature 97.9 F (36.6 C), temperature source Oral, resp. rate 18, height $RemoveBe'5\' 2"'fPUCsxWPX$  (1.575 m), weight 71 kg (156 lb 8.4 oz), SpO2 99 %. NAD, pleasant RRR CTAB, diminshed in bases No LEE +B/T of LUE AVF  Dialyzes at [facility] dialysis-Monday Wednesday Friday EDW 165 pounds (75 kg). She runs4 hours, blood flow rate 400 is on Aranesp 40 and received in Wednesday no vitamin D HD Bath 2K/2.5 calcium, Dialyzer standard, Heparin yes, 2500 bolus. Access has PermCath but left upper arm fistula is ready for  use.  A 1. ESRD MWF [facility] with TDC and AVF beginning cannulation 2. CAP now on levofloxacin 3. Mature AVF 4. HTN 5. Anemia on ESA as outpt 6. 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 Cont HD on MWF schedule   Pearson Grippe MD 02/21/2016, 11:03 AM   Recent Labs Lab 02/20/16 0549 02/20/16 0848 02/21/16 0231  NA 142 140 134*  K 4.2 4.0 4.1  CL 103 101 95*  CO2 $Re'26 25 26  'Gtx$ GLUCOSE 95 102* 128*  BUN 31* 32* 20  CREATININE 6.19* 6.36* 4.40*  CALCIUM 8.4* 8.2* 8.4*  PHOS  --  5.5*  --     Recent Labs Lab 02/18/16 1245  02/20/16 0549 02/20/16 0849 02/21/16 0231  WBC 11.1*  < > 6.7 6.4 5.8  NEUTROABS 10.0*  --   --   --   --   HGB 9.6*  < > 7.5* 7.2* 7.4*  HCT 29.9*  < > 24.4* 23.0* 23.9*  MCV 94.3  < > 92.8 93.1 93.0  PLT 287  < > 208 192 206  < > = values in this interval not displayed.

## 2016-02-21 NOTE — Progress Notes (Signed)
Ambulated 200 ft in hallway using a walker, fairly tolerated. Unsteady and weak, Denies SOB and  Dizziness. Report called to York Grice receiving nurse. Ready to transfer out.

## 2016-02-21 NOTE — Progress Notes (Signed)
[facility] PROGRESS NOTE  Isabel Sharp [id number] DOB: 03/27/1971 DOA: 02/18/2016  PCP: Sheral Apley  Brief HPI: 46 year old female of [place] origin with a past medical history of end-stage renal disease on hemodialysis on Monday, Wednesday, Friday, hypertension, diabetes, who was recently hospitalized for acute appendicitis and underwent laparoscopic appendectomy. She presented with complaints of cough and shortness of breath and was found to be febrile. Her symptoms suggestive of an infectious process. She was admitted to the hospital for further management.  Past medical history:  Past Medical History  Diagnosis Date  . End-stage renal disease on hemodialysis ([place])   . Diabetes mellitus with end stage renal disease ([place])   . Coronary artery disease   . Carotid artery disease [facility])     Consultants: Nephrology  Procedures: None  Antibiotics: Vancomycin and Zosyn To 5/20 Levaquin 5/20  Subjective: Patient continues to feel better. Continues to have a cough which is mostly dry. Denies any chest pain, shortness of breath, lightheadedness or dizziness. No headaches.   Objective:  Vital Signs  Filed Vitals:   02/20/16 2142 02/20/16 2206 02/21/16 0011 02/21/16 0446  BP:  164/68 158/62 158/71  Pulse:   54 57  Temp:   98 F (36.7 C) 98.2 F (36.8 C)  TempSrc:   Oral Oral  Resp:   19 16  Height:      Weight:      SpO2: 100%  100% 100%    Intake/Output Summary (Last 24 hours) at 02/21/16 0742 Last data filed at 02/20/16 2208  Gross per 24 hour  Intake    100 ml  Output   3000 ml  Net  -2900 ml   Filed Weights   02/18/16 2100 02/20/16 0837 02/20/16 1237  Weight: 72.5 kg (159 lb 13.3 oz) 74 kg (163 lb 2.3 oz) 71 kg (156 lb 8.4 oz)    General appearance: alert, cooperative, appears stated age and no distress Resp: Improved air entry bilaterally. Crackles present, but less than before. No wheezing. No rhonchi.  Cardio: regular rate and rhythm,  S1, S2 normal, no murmur, click, rub or gallop GI: soft, non-tender; bowel sounds normal; no masses,  no organomegaly Neurologic: Awake and alert. Oriented 3. No focal neurological deficits are noted.  Lab Results:  Data Reviewed: I have personally reviewed following labs and imaging studies  CBC:  Recent Labs Lab 02/18/16 1245 02/19/16 0356 02/20/16 0549 02/20/16 0849 02/21/16 0231  WBC 11.1* 9.2 6.7 6.4 5.8  NEUTROABS 10.0*  --   --   --   --   HGB 9.6* 8.7* 7.5* 7.2* 7.4*  HCT 29.9* 27.9* 24.4* 23.0* 23.9*  MCV 94.3 93.6 92.8 93.1 93.0  PLT 287 197 208 192 809   Basic Metabolic Panel:  Recent Labs Lab 02/18/16 1245 02/19/16 0356 02/20/16 0549 02/20/16 0848 02/21/16 0231  NA 136 136 142 140 134*  K 4.0 4.2 4.2 4.0 4.1  CL 98* 99* 103 101 95*  CO2 27 21* $Remo'26 25 26  'ZkmxT$ GLUCOSE 185* 215* 95 102* 128*  BUN 11 17 31* 32* 20  CREATININE 2.44* 4.00* 6.19* 6.36* 4.40*  CALCIUM 8.4* 8.1* 8.4* 8.2* 8.4*  PHOS  --   --   --  5.5*  --    GFR: Estimated Creatinine Clearance: 14.6 mL/min (by C-G formula based on Cr of 4.4).  Liver Function Tests:  Recent Labs Lab 02/18/16 1245 02/20/16 0848  AST 20  --   ALT 16  --   ALKPHOS 93  --  BILITOT 0.8  --   PROT 7.7  --   ALBUMIN 3.5 2.6*   CBG:  Recent Labs Lab 02/19/16 2136 02/20/16 0759 02/20/16 1425 02/20/16 1702 02/20/16 2130  GLUCAP 128* 107* 89 263* 156*   Urine analysis:    Component Value Date/Time   COLORURINE YELLOW 02/01/2016 1024   APPEARANCEUR CLEAR 02/01/2016 1024   LABSPEC 1.019 02/01/2016 1024   PHURINE 7.5 02/01/2016 1024   GLUCOSEU 100* 02/01/2016 1024   HGBUR NEGATIVE 02/01/2016 1024   BILIRUBINUR NEGATIVE 02/01/2016 1024   KETONESUR NEGATIVE 02/01/2016 1024   PROTEINUR >300* 02/01/2016 1024   NITRITE NEGATIVE 02/01/2016 1024   LEUKOCYTESUR NEGATIVE 02/01/2016 1024    Recent Results (from the past 240 hour(s))  Culture, blood (routine x 2) Call MD if unable to obtain prior to  antibiotics being given     Status: None (Preliminary result)   Collection Time: 02/18/16 10:09 PM  Result Value Ref Range Status   Specimen Description BLOOD RIGHT ANTECUBITAL  Final   Special Requests IN PEDIATRIC BOTTLE 3CC  Final   Culture NO GROWTH 2 DAYS  Final   Report Status PENDING  Incomplete  Culture, blood (routine x 2) Call MD if unable to obtain prior to antibiotics being given     Status: None (Preliminary result)   Collection Time: 02/18/16 10:24 PM  Result Value Ref Range Status   Specimen Description BLOOD RIGHT HAND  Final   Special Requests IN PEDIATRIC BOTTLE [place]  Final   Culture NO GROWTH 2 DAYS  Final   Report Status PENDING  Incomplete  MRSA PCR Screening     Status: None   Collection Time: 02/18/16 11:29 PM  Result Value Ref Range Status   MRSA by PCR NEGATIVE NEGATIVE Final    Comment:        The GeneXpert MRSA Assay (FDA approved for NASAL specimens only), is one component of a comprehensive MRSA colonization surveillance program. It is not intended to diagnose MRSA infection nor to guide or monitor treatment for MRSA infections.       Radiology Studies: No results found.   Medications:  Scheduled: . aspirin  325 mg Oral Daily  . bumetanide  2 mg Oral Daily  . carvedilol  25 mg Oral BID WC  . cloNIDine  0.1 mg Oral BID  . diltiazem  300 mg Oral BID  . docusate sodium  100 mg Oral BID  . guaiFENesin  1,200 mg Oral BID  . heparin  5,000 Units Subcutaneous Q8H  . hydrALAZINE  100 mg Oral TID  . insulin aspart  0-9 Units Subcutaneous TID WC  . insulin glargine  10 Units Subcutaneous QHS  . ipratropium-albuterol  3 mL Nebulization TID  . isosorbide dinitrate  10 mg Oral TID  . losartan  100 mg Oral Daily  . omega-3 acid ethyl esters  1 g Oral Daily  . pravastatin  40 mg Oral QHS  . sevelamer carbonate  2,400 mg Oral TID WC  . 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 However, her symptoms were suggestive of a respiratory tract infection. Due to the  recent hospitalization as well as need for dialysis, she was placed on broad-spectrum coverage. Blood cultures are negative so far. Patient is feeling better. Transition to oral antibiotics today. Lactic acid was normal. Continue Mucinex and Robitussin for now. Strep pneumonia antigen was negative.   End-stage renal disease on hemodialysis Nephrology is following. Patient has been dialyzed during this hospital stay. She is on a Monday, Wednesday, Friday schedule. Patient has a left upper extremity fistula, which has been approved for use. She continues to have a right-sided dialysis catheter as well.  Essential hypertension. Blood pressure was poorly controlled yesterday despite the multiple antihypertensive agents. Clonidine was initiated yesterday afternoon. Blood pressure has improved. However, she is noted to be slightly more bradycardic than before. Will need to be monitored closely for now. Holding parameters. Continue other medications as before.  Diabetes mellitus type 2. Continue Lantus and sliding scale coverage. Monitor CBGs. HbA1c was 6.8.  Normocytic anemia likely due to chronic disease Drop in hemoglobin noted. Anemia is likely secondary to chronic disease. No evidence for overt bleeding. Transfuse if hemoglobin drops less than 7.    DVT Prophylaxis: Subcutaneous heparin    Code Status: Full code  Family Communication: Discussed with the patient  Disposition Plan: Patient is feeling better. Transition to oral antibiotics today. Okay for transfer to  telemetry. Mobilize. Anticipate discharge tomorrow.     LOS: 3 days   [facility] Pager [phone] 02/21/2016, 7:42 AM  If 7PM-7AM, please contact night-coverage at [email or web address], password [facility]

## 2016-02-22 LAB — GLUCOSE, CAPILLARY
GLUCOSE-CAPILLARY: 162 mg/dL — AB (ref 65–99)
Glucose-Capillary: 178 mg/dL — ABNORMAL HIGH (ref 65–99)

## 2016-02-22 MED ORDER — LEVOFLOXACIN 500 MG PO TABS: 500.0000 mg | ORAL_TABLET | Freq: Every day | ORAL | Status: DC

## 2016-02-22 MED ORDER — CLONIDINE HCL 0.1 MG PO TABS: 0.1000 mg | ORAL_TABLET | Freq: Every day | ORAL | Status: AC

## 2016-02-22 MED ORDER — GUAIFENESIN ER 600 MG PO TB12: 1200.0000 mg | ORAL_TABLET | Freq: Two times a day (BID) | ORAL | Status: DC

## 2016-02-22 MED ORDER — LEVOFLOXACIN 500 MG PO TABS: 500.0000 mg | ORAL_TABLET | ORAL | Status: DC

## 2016-02-22 MED ORDER — GUAIFENESIN 100 MG/5ML PO SOLN: 5.0000 mL | ORAL | Status: DC | PRN

## 2016-02-22 MED ORDER — ISOSORBIDE DINITRATE 5 MG PO TABS: 10.0000 mg | ORAL_TABLET | Freq: Three times a day (TID) | ORAL | Status: AC

## 2016-02-22 NOTE — Discharge Instructions (Signed)

## 2016-02-22 NOTE — Progress Notes (Signed)
Patient discharge teaching given, including activity, diet, follow-up appoints, and medications. Patient verbalized understanding of all discharge instructions. IV access was d/c'd. Vitals are stable. Skin is intact except as charted in most recent assessments. Pt to be escorted out by NT, to be driven home by family.  Ramir Malerba, MBA, BSN, RN 

## 2016-02-22 NOTE — Discharge Summary (Signed)
[facility]  Physician Discharge Summary   Patient ID: Isabel Sharp MRN: [id number] DOB/AGE: 1971-02-26 45 y.o.  Admit date: 02/18/2016 Discharge date: 02/22/2016  PCP: HEDGECOCK,SUZANNE, PA-C  DISCHARGE DIAGNOSES:  Principal Problem:   HCAP (healthcare-associated pneumonia) Active Problems:   ESRD (end stage renal disease) ([place])   HTN (hypertension)   DM type 2 causing renal disease ([place])   RECOMMENDATIONS FOR OUTPATIENT FOLLOW UP: 1. Patient to keep her usual dialysis schedule. 2. Consider discontinuing clonidine if blood pressure remains adequately controlled.   DISCHARGE CONDITION: fair  Diet recommendation: As before  [facility] Weights   02/20/16 0837 02/20/16 1237 02/22/16 0430  Weight: 74 kg (163 lb 2.3 oz) 71 kg (156 lb 8.4 oz) 73.029 kg (161 lb)    INITIAL HISTORY: 45 year old female of [place] origin with a past medical history of end-stage renal disease on hemodialysis on Monday, Wednesday, Friday, hypertension, diabetes, who was recently hospitalized for acute appendicitis and underwent laparoscopic appendectomy. She presented with complaints of cough and shortness of breath and was found to be febrile. Her symptoms suggestive of an infectious process. She was admitted to the hospital for further management.  Consultations:  Nephrology  Procedures:  None  HOSPITAL COURSE:   Fever with cough and shortness of breath, presumed healthcare associated pneumonia Chest x-ray did not show any infiltrate. However, her symptoms were suggestive of a respiratory tract infection. Due to the recent hospitalization as well as need for dialysis, she was placed on broad-spectrum coverage. Blood cultures are negative so far. Patient started feeling better. Patient was changed over to [place]. Lactic acid was normal. Strep pneumonia antigen was negative. She ambulated without any difficulty. Room saturations were normal. Patient was asked to follow-up with her  PCP.  End-stage renal disease on hemodialysis Nephrology was consulted. Patient was dialyzed during this hospital stay. She is on a Monday, Wednesday, Friday schedule. Patient has a left upper extremity fistula, which has been approved for use. She continues to have a right-sided dialysis catheter as well.  Essential hypertension. Blood pressure was poorly controlled yesterday despite the multiple antihypertensive agents. Dose of isosorbide dinitrate will increase. Clonidine had to be added. Blood pressure appears to have responded. There is also possibility that elevated blood pressure could have been due to acute illness. So we will cut back on the dose of clonidine. She should discuss this further with her PCP or nephrologist. Continue other medications as before.  Diabetes mellitus type 2. She was continued on Lantus. She was placed on sliding scale coverage. HbA1c was 6.8. Continue home medications.   Normocytic anemia likely due to chronic disease Drop in hemoglobin noted. Anemia is likely secondary to chronic disease. No evidence for overt bleeding. Hemoglobin remained stable.  Overall improved. Patient wants to go home. She is stable for discharge.   PERTINENT LABS:  The results of significant diagnostics from this hospitalization (including imaging, microbiology, ancillary and laboratory) are listed below for reference.    Microbiology: Recent Results (from the past 240 hour(s))  Culture, blood (routine x 2) Call MD if unable to obtain prior to antibiotics being given     Status: None (Preliminary result)   Collection Time: 02/18/16 10:09 PM  Result Value Ref Range Status   Specimen Description BLOOD RIGHT ANTECUBITAL  Final   Special Requests IN PEDIATRIC BOTTLE 3CC  Final   Culture NO GROWTH 4 DAYS  Final   Report Status PENDING  Incomplete  Culture, blood (routine x 2) Call MD if unable to obtain  prior to antibiotics being given     Status: None (Preliminary result)    Collection Time: 02/18/16 10:24 PM  Result Value Ref Range Status   Specimen Description BLOOD RIGHT HAND  Final   Special Requests IN PEDIATRIC BOTTLE 1CC  Final   Culture NO GROWTH 4 DAYS  Final   Report Status PENDING  Incomplete  MRSA PCR Screening     Status: None   Collection Time: 02/18/16 11:29 PM  Result Value Ref Range Status   MRSA by PCR NEGATIVE NEGATIVE Final    Comment:        The GeneXpert MRSA Assay (FDA approved for NASAL specimens only), is one component of a comprehensive MRSA colonization surveillance program. It is not intended to diagnose MRSA infection nor to guide or monitor treatment for MRSA infections.      Labs: Basic Metabolic Panel:  Recent Labs Lab 02/18/16 1245 02/19/16 0356 02/20/16 0549 02/20/16 0848 02/21/16 0231  NA 136 136 142 140 134*  K 4.0 4.2 4.2 4.0 4.1  CL 98* 99* 103 101 95*  CO2 27 21* $Remo'26 25 26  'HXgeS$ GLUCOSE 185* 215* 95 102* 128*  BUN 11 17 31* 32* 20  CREATININE 2.44* 4.00* 6.19* 6.36* 4.40*  CALCIUM 8.4* 8.1* 8.4* 8.2* 8.4*  PHOS  --   --   --  5.5*  --    Liver Function Tests:  Recent Labs Lab 02/18/16 1245 02/20/16 0848  AST 20  --   ALT 16  --   ALKPHOS 93  --   BILITOT 0.8  --   PROT 7.7  --   ALBUMIN 3.5 2.6*   CBC:  Recent Labs Lab 02/18/16 1245 02/19/16 0356 02/20/16 0549 02/20/16 0849 02/21/16 0231  WBC 11.1* 9.2 6.7 6.4 5.8  NEUTROABS 10.0*  --   --   --   --   HGB 9.6* 8.7* 7.5* 7.2* 7.4*  HCT 29.9* 27.9* 24.4* 23.0* 23.9*  MCV 94.3 93.6 92.8 93.1 93.0  PLT 287 197 208 192 206   CBG:  Recent Labs Lab 02/21/16 1206 02/21/16 1659 02/21/16 2052 02/22/16 0817 02/22/16 1213  GLUCAP 237* 229* 150* 162* 178*     IMAGING STUDIES  Dg Chest 2 View  02/18/2016  CLINICAL DATA:  Cough, fever EXAM: CHEST  2 VIEW COMPARISON:  None. FINDINGS: There is mild bilateral interstitial prominence with prominence of the central pulmonary vasculature. There is no focal parenchymal opacity. There is no  pleural effusion or pneumothorax. There is stable cardiomegaly. There is evidence of prior median sternotomy. Dual-lumen central venous catheter. The osseous structures are unremarkable. IMPRESSION: Cardiomegaly with mild pulmonary vascular congestion. Electronically Signed   By: Kathreen Devoid   On: 02/18/2016 13:12   Dg Chest Port 1 View  02/19/2016  CLINICAL DATA:  Shortness of breath EXAM: PORTABLE CHEST 1 VIEW COMPARISON:  02/18/2016 FINDINGS: Postoperative changes in the mediastinum. Right central venous catheter with tip over the cavoatrial junction. Shallow inspiration with atelectasis in the lung bases. Cardiac enlargement. No pulmonary vascular congestion. No focal airspace disease or consolidation. No blunting of costophrenic angles. No pneumothorax. IMPRESSION: Cardiac enlargement without significant vascular congestion or edema. Shallow inspiration with atelectasis in the lung bases. Electronically Signed   By: Lucienne Capers M.D.   On: 02/19/2016 03:54     DISCHARGE EXAMINATION: Filed Vitals:   02/21/16 1702 02/21/16 2000 02/21/16 2050 02/22/16 0430  BP: 148/60 162/63  157/63  Pulse: 50 51  59  Temp: 97.5 F (  36.4 C) 97.8 F (36.6 C)  98.2 F (36.8 C)  TempSrc: Oral     Resp: $Remo'16 18  20  'KNLoM$ Height:      Weight:    73.029 kg (161 lb)  SpO2: 100% 98% 99% 100%   General appearance: alert, cooperative, appears stated age and no distress Resp: Improved air entry bilaterally. Few crackles at the bases. No wheezing, no rhonchi. Cardio: regular rate and rhythm, S1, S2 normal, no murmur, click, rub or gallop GI: soft, non-tender; bowel sounds normal; no masses,  no organomegaly Extremities: extremities normal, atraumatic, no cyanosis or edema Neurologic: Awake and alert. Oriented 3. No focal neurological deficits.  DISPOSITION: Home with family  Discharge Instructions    Call MD for:  difficulty breathing, headache or visual disturbances    Complete by:  As directed      Call MD  for:  extreme fatigue    Complete by:  As directed      Call MD for:  persistant dizziness or light-headedness    Complete by:  As directed      Call MD for:  persistant nausea and vomiting    Complete by:  As directed      Call MD for:  severe uncontrolled pain    Complete by:  As directed      Call MD for:  temperature >100.4    Complete by:  As directed      Discharge instructions    Complete by:  As directed   Please go for dialysis as per usual schedule. Please discuss with your PCP or Nephrologist about continuing Clonidine. Please avoid exertion for next few days.  You were cared for by a hospitalist during your hospital stay. If you have any questions about your discharge medications or the care you received while you were in the hospital after you are discharged, you can call the unit and asked to speak with the hospitalist on call if the hospitalist that took care of you is not available. Once you are discharged, your primary care physician will handle any further medical issues. Please note that NO REFILLS for any discharge medications will be authorized once you are discharged, as it is imperative that you return to your primary care physician (or establish a relationship with a primary care physician if you do not have one) for your aftercare needs so that they can reassess your need for medications and monitor your lab values. If you do not have a primary care physician, you can call [phone] for a physician referral.     Increase activity slowly    Complete by:  As directed            ALLERGIES:  Allergies  Allergen Reactions  . Norvasc [Amlodipine] Swelling  . Vicks Blue Cough Lozenges [Menthol] Other (See Comments)    Makes her cough worse. She can you vicks vapor rub per pt     Current Discharge Medication List    START taking these medications   Details  cloNIDine (CATAPRES) 0.1 MG tablet Take 1 tablet (0.1 mg total) by mouth daily. Qty: 30 tablet, Refills: 0     guaiFENesin (MUCINEX) 600 MG 12 hr tablet Take 2 tablets (1,200 mg total) by mouth 2 (two) times daily. Qty: 30 tablet, Refills: 0    guaiFENesin (ROBITUSSIN) 100 MG/5ML SOLN Take 5 mLs (100 mg total) by mouth every 4 (four) hours as needed for cough or to loosen phlegm. Qty: 236 mL, Refills: 0  levofloxacin (LEVAQUIN) 500 MG tablet Take 1 tablet (500 mg total) by mouth every other day. Start on 02/23/16 and take every other day for 3 more doses. Qty: 3 tablet, Refills: 0      CONTINUE these medications which have CHANGED   Details  isosorbide dinitrate (ISORDIL) 5 MG tablet Take 2 tablets (10 mg total) by mouth 3 (three) times daily. Qty: 180 tablet, Refills: 0      CONTINUE these medications which have NOT CHANGED   Details  acetaminophen (TYLENOL) 325 MG tablet Take 325 mg by mouth 2 (two) times daily as needed (pain).    aspirin 325 MG tablet Take 325 mg by mouth daily.    bumetanide (BUMEX) 2 MG tablet Take 2 mg by mouth daily.    carvedilol (COREG) 25 MG tablet Take 25 mg by mouth 2 (two) times daily.     Cholecalciferol (VITAMIN D3) 1000 UNITS CAPS Take by mouth every morning.    diltiazem (CARDIZEM CD) 300 MG 24 hr capsule Take 1 capsule (300 mg total) by mouth 2 (two) times daily. Qty: 60 capsule, Refills: 0    docusate sodium (COLACE) 100 MG capsule Take 100 mg by mouth 2 (two) times daily.    Ferric Citrate (AURYXIA) 1 GM 210 MG(Fe) TABS Take 210 mg by mouth 3 (three) times daily with meals.     glipiZIDE (GLUCOTROL) 10 MG tablet Take 10 mg by mouth daily before breakfast.     hydrALAZINE (APRESOLINE) 100 MG tablet Take 100 mg by mouth 3 (three) times daily.    insulin glargine (LANTUS) 100 UNIT/ML injection Inject 15 Units into the skin every morning. > 140    lidocaine-prilocaine (EMLA) cream Apply 1 application topically every Monday, Wednesday, and Friday.    losartan (COZAAR) 50 MG tablet Take 2 tablets (100 mg total) by mouth daily. Qty: 60 tablet,  Refills: 1    Omega-3 Krill Oil 500 MG CAPS Take 500 mg by mouth daily.     OVER THE COUNTER MEDICATION Take 30 mLs by mouth every Monday, Wednesday, and Friday.    polyethylene glycol (MIRALAX / GLYCOLAX) packet Take 17 g by mouth daily as needed for mild constipation.    pravastatin (PRAVACHOL) 40 MG tablet Take 40 mg by mouth at bedtime.    sevelamer carbonate (RENVELA) 800 MG tablet Take 2,400 mg by mouth 3 (three) times daily with meals.    sodium bicarbonate 650 MG tablet Take 650 mg by mouth at bedtime.    DOCOSAHEXAENOIC ACID PO Take 1 capsule by mouth every morning.    epoetin alfa (EPOGEN,PROCRIT) 46503 UNIT/ML injection Inject 10,000 Units into the skin every Monday, Wednesday, and Friday.        Follow-up Information    Follow up with HEDGECOCK,SUZANNE, PA-C. Schedule an appointment as soon as possible for a visit in 1 week.   Specialty:  Physician Assistant   Why:  post hospitalization follow up   Contact information:   [address] [place] [address] [phone]       TOTAL DISCHARGE TIME: 32 minutes  [facility] Pager [phone]  02/22/2016, 1:31 PM

## 2016-02-22 NOTE — Progress Notes (Signed)
Admit: 02/18/2016 LOS: 4  66F ESRD [facility] MWF admit with CAP, recent appendectomy  Subjective:  No new events Afebrile  05/20 0701 - 05/21 0700 In: 1110 [P.O.:960; IV Piggyback:150] Out: 0   Filed Weights   02/20/16 0837 02/20/16 1237 02/22/16 0430  Weight: 74 kg (163 lb 2.3 oz) 71 kg (156 lb 8.4 oz) 73.029 kg (161 lb)    Scheduled Meds: . aspirin  325 mg Oral Daily  . bumetanide  2 mg Oral Daily  . carvedilol  25 mg Oral BID WC  . cloNIDine  0.1 mg Oral BID  . diltiazem  300 mg Oral BID  . docusate sodium  100 mg Oral BID  . guaiFENesin  1,200 mg Oral BID  . heparin  5,000 Units Subcutaneous Q8H  . hydrALAZINE  100 mg Oral TID  . insulin aspart  0-9 Units Subcutaneous TID WC  . insulin glargine  10 Units Subcutaneous QHS  . isosorbide dinitrate  10 mg Oral TID  . [START ON 02/23/2016] levofloxacin  500 mg Oral Q48H  . losartan  100 mg Oral Daily  . omega-3 acid ethyl esters  1 g Oral Daily  . pravastatin  40 mg Oral QHS  . sevelamer carbonate  2,400 mg Oral TID WC  . sodium bicarbonate  650 mg Oral QHS   Continuous Infusions:  PRN Meds:.acetaminophen, albuterol, guaiFENesin, ipratropium-albuterol, labetalol, polyethylene glycol  Current Labs: reviewed    Physical Exam:  Blood pressure 157/63, pulse 59, temperature 98.2 F (36.8 C), temperature source Oral, resp. rate 20, height $RemoveBe'5\' 2"'DSpcDEnnN$  (1.575 m), weight 73.029 kg (161 lb), SpO2 100 %. NAD, pleasant RRR CTAB, diminshed in bases No LEE +B/T of LUE AVF  [facility]-Monday Wednesday Friday EDW 165 pounds (75 kg). She runs4 hours, blood flow rate 400 is on Aranesp 40 and received in Wednesday no vitamin D HD Bath 2K/2.5 calcium, Dialyzer standard, Heparin yes, 2500 bolus. Access has PermCath but left upper arm fistula is ready for use.  A 1. ESRD MWF [facility] with TDC and AVF beginning cannulation 2. CAP now on levofloxacin 3. Mature AVF 4. HTN 5. Anemia on ESA as outpt 6. 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 Cont  HD on MWF schedule if here   Pearson Grippe MD 02/22/2016, 9:59 AM   Recent Labs Lab 02/20/16 0549 02/20/16 0848 02/21/16 0231  NA 142 140 134*  K 4.2 4.0 4.1  CL 103 101 95*  CO2 $Re'26 25 26  'alN$ GLUCOSE 95 102* 128*  BUN 31* 32* 20  CREATININE 6.19* 6.36* 4.40*  CALCIUM 8.4* 8.2* 8.4*  PHOS  --  5.5*  --     Recent Labs Lab 02/18/16 1245  02/20/16 0549 02/20/16 0849 02/21/16 0231  WBC 11.1*  < > 6.7 6.4 5.8  NEUTROABS 10.0*  --   --   --   --   HGB 9.6*  < > 7.5* 7.2* 7.4*  HCT 29.9*  < > 24.4* 23.0* 23.9*  MCV 94.3  < > 92.8 93.1 93.0  PLT 287  < > 208 192 206  < > = values in this interval not displayed.

## 2016-02-23 LAB — CULTURE, BLOOD (ROUTINE X 2)
CULTURE: NO GROWTH
Culture: NO GROWTH

## 2016-04-22 ENCOUNTER — Emergency Department (HOSPITAL_BASED_OUTPATIENT_CLINIC_OR_DEPARTMENT_OTHER)
Admission: EM | Admit: 2016-04-22 | Discharge: 2016-04-22 | Disposition: A | Discharge status: Home / Self Care | Payer: BLUE CROSS/BLUE SHIELD | Attending: Emergency Medicine | Admitting: Emergency Medicine

## 2016-04-22 ENCOUNTER — Encounter (HOSPITAL_BASED_OUTPATIENT_CLINIC_OR_DEPARTMENT_OTHER): Payer: Self-pay

## 2016-04-22 DIAGNOSIS — Z992 Dependence on renal dialysis: Secondary | ICD-10-CM | POA: Diagnosis not present

## 2016-04-22 DIAGNOSIS — Z79899 Other long term (current) drug therapy: Secondary | ICD-10-CM | POA: Insufficient documentation

## 2016-04-22 DIAGNOSIS — E1122 Type 2 diabetes mellitus with diabetic chronic kidney disease: Secondary | ICD-10-CM | POA: Insufficient documentation

## 2016-04-22 DIAGNOSIS — I10 Essential (primary) hypertension: Secondary | ICD-10-CM

## 2016-04-22 DIAGNOSIS — Z7982 Long term (current) use of aspirin: Secondary | ICD-10-CM | POA: Insufficient documentation

## 2016-04-22 DIAGNOSIS — N186 End stage renal disease: Secondary | ICD-10-CM | POA: Diagnosis not present

## 2016-04-22 DIAGNOSIS — I2510 Atherosclerotic heart disease of native coronary artery without angina pectoris: Secondary | ICD-10-CM | POA: Insufficient documentation

## 2016-04-22 DIAGNOSIS — I120 Hypertensive chronic kidney disease with stage 5 chronic kidney disease or end stage renal disease: Secondary | ICD-10-CM | POA: Insufficient documentation

## 2016-04-22 DIAGNOSIS — Z794 Long term (current) use of insulin: Secondary | ICD-10-CM | POA: Diagnosis not present

## 2016-04-22 NOTE — ED Provider Notes (Signed)
CSN: [id number]     Arrival date & time 04/22/16  2057 History   By signing my name below, I, Maud Deed. Royston Sinner, attest that this documentation has been prepared under the direction and in the presence of Orlie Dakin, MD.  Electronically Signed: Maud Deed. Royston Sinner, ED Scribe. 04/22/2016. 10:29 PM.   Chief Complaint  Patient presents with  . Hypertension   The history is provided by the patient. No language interpreter was used.    HPI Comments: Isabel Sharp is a 45 y.o. female with a PMHx of DM, CAD, and ESRD who presents to the Emergency Department here for hypertension this evening. Pt states she felt chills early this morning.Resolve spontaneously Denies any other symptoms. No chest pain or shortness of breath no weakness no abdominal pain no headache no visual changes. Currently she is at baseline and symptom free. However, yesterday pt states towards the end of her dialysis treatment her blood pressure was 220/~90. Pt waited for several minutes and states blood pressure improved to 189/~90 prior to going home. Currently pt dialyzes on Mondays, Wednesdays, and Fridays.  Clonidine last taken at approprietly 2:00 PM this afternoon. Blood pressure medications was last adjusted in April 2017 after her appendectomy. However, pt states she is always complaint with her medications.  PCP: [name], PA-C - [facility]  NEPHROLOGIST: Red Christians, MD- [facility]   Past Medical History  Diagnosis Date  . End-stage renal disease on hemodialysis ([place])   . Diabetes mellitus with end stage renal disease ([place])   . Coronary artery disease   . Carotid artery disease [facility])    Past Surgical History  Procedure Laterality Date  . Cesarean section    . Laparoscopic bilateral salpingo oopherectomy    . Cardiac catheterization  02/21/14    done at [facility]-  . Coronary artery bypass graft  02/24/14    essel coronary artery disease and had bypass grafting in May 2015 with a mammary to the  LAD, a saphenous vein graft to the OM and a saphenous vein graft to the diagonal  . Carotid endarterectomy Left 04/30/2015  . Laparoscopic appendectomy N/A 01/30/2016    Procedure: APPENDECTOMY LAPAROSCOPIC;  Surgeon: Mickeal Skinner, MD;  Location[facility] OR;  Service: General;  Laterality: N/A;   Family History  Problem Relation Age of Onset  . Heart attack Mother   . Heart attack Father   . Heart attack Maternal Grandmother   . Heart attack Maternal Grandfather   . Hypertension Father    Social History  Substance Use Topics  . Smoking status: Never Smoker   . Smokeless tobacco: Never Used  . Alcohol Use: No   OB History    No data available     Review of Systems  Constitutional: Positive for chills.  HENT: Negative.   Respiratory: Negative.   Cardiovascular: Negative.   Gastrointestinal: Negative.   Musculoskeletal: Negative.   Skin: Negative.   Allergic/Immunologic: Positive for immunocompromised state.       Hemodialysis patient  Neurological: Negative.   Psychiatric/Behavioral: Negative.   All other systems reviewed and are negative.     Allergies  Norvasc and Vicks blue cough lozenges  Home Medications   Prior to Admission medications   Medication Sig Start Date End Date Taking? Authorizing Provider  diltiazem (CARDIZEM CD) 300 MG 24 hr capsule Take 300 mg by mouth daily.   Yes Historical Provider, MD  acetaminophen (TYLENOL) 325 MG tablet Take 325 mg by mouth 2 (two) times daily as needed (  pain).    Historical Provider, MD  aspirin 325 MG tablet Take 325 mg by mouth daily.    Historical Provider, MD  bumetanide (BUMEX) 2 MG tablet Take 2 mg by mouth daily.    Historical Provider, MD  carvedilol (COREG) 25 MG tablet Take 25 mg by mouth 2 (two) times daily.     Historical Provider, MD  Cholecalciferol (VITAMIN D3) 1000 UNITS CAPS Take by mouth every morning.    Historical Provider, MD  cloNIDine (CATAPRES) 0.1 MG tablet Take 1 tablet (0.1 mg total) by mouth  daily. 02/22/16   Bonnielee Haff, MD  DOCOSAHEXAENOIC ACID PO Take 1 capsule by mouth every morning.    Historical Provider, MD  docusate sodium (COLACE) 100 MG capsule Take 100 mg by mouth 2 (two) times daily.    Historical Provider, MD  epoetin alfa (EPOGEN,PROCRIT) 64403 UNIT/ML injection Inject 10,000 Units into the skin every Monday, Wednesday, and Friday.     Historical Provider, MD  Ferric Citrate (AURYXIA) 1 GM 210 MG(Fe) TABS Take 210 mg by mouth 3 (three) times daily with meals.     Historical Provider, MD  glipiZIDE (GLUCOTROL) 10 MG tablet Take 10 mg by mouth daily before breakfast.     Historical Provider, MD  hydrALAZINE (APRESOLINE) 100 MG tablet Take 100 mg by mouth 3 (three) times daily.    Historical Provider, MD  insulin glargine (LANTUS) 100 UNIT/ML injection Inject 15 Units into the skin every morning. > 140    Historical Provider, MD  isosorbide dinitrate (ISORDIL) 5 MG tablet Take 2 tablets (10 mg total) by mouth 3 (three) times daily. 02/22/16   Bonnielee Haff, MD  lidocaine-prilocaine (EMLA) cream Apply 1 application topically every Monday, Wednesday, and Friday. 01/01/16   Historical Provider, MD  losartan (COZAAR) 50 MG tablet Take 2 tablets (100 mg total) by mouth daily. 02/06/16 03/07/16  [name], MD  Omega-3 Krill Oil 500 MG CAPS Take 500 mg by mouth daily.     Historical Provider, MD  OVER THE COUNTER MEDICATION Take 30 mLs by mouth every Monday, Wednesday, and Friday.    Historical Provider, MD  polyethylene glycol (MIRALAX / GLYCOLAX) packet Take 17 g by mouth daily as needed for mild constipation.    Historical Provider, MD  pravastatin (PRAVACHOL) 40 MG tablet Take 40 mg by mouth at bedtime.    Historical Provider, MD  sevelamer carbonate (RENVELA) 800 MG tablet Take 2,400 mg by mouth 3 (three) times daily with meals.    Historical Provider, MD   Triage Vitals: BP 200/80 mmHg  Pulse 50  Temp(Src) 98.7 F (37.1 C)  Resp 21  Ht $R'5\' 2"'Nu$  (1.575 m)  Wt 160 lb  (72.576 kg)  BMI 29.26 kg/m2  SpO2 97%   Physical Exam  Constitutional: She appears well-developed and well-nourished.  HENT:  Head: Normocephalic and atraumatic.  Eyes: Conjunctivae are normal. Pupils are equal, round, and reactive to light.  Neck: Neck supple. No tracheal deviation present. No thyromegaly present.  Cardiovascular: Normal rate and regular rhythm.   No murmur heard. Pulmonary/Chest: Effort normal and breath sounds normal.  Abdominal: Soft. Bowel sounds are normal. She exhibits no distension. There is no tenderness.  Musculoskeletal: Normal range of motion. She exhibits no edema or tenderness.  Left upper extremity with dialysis fistula with good thrill  Neurological: She is alert. Coordination normal.  Skin: Skin is warm and dry. No rash noted.  Psychiatric: She has a normal mood and affect.  Nursing note  and vitals reviewed.   ED Course  Procedures (including critical care time)  DIAGNOSTIC STUDIES: Oxygen Saturation is 97% on RA, Adequate by my interpretation.    COORDINATION OF CARE: 10:29 PM-Discussed treatment plan with pt at bedside and pt agreed to plan.     Labs Review Labs Reviewed - No data to display  Imaging Review No results found. I have personally reviewed and evaluated these images and lab results as part of my medical decision-making.   EKG Interpretation None      MDM  No evidence of hypertensive emergency. She took one of her own clonidine tablets immediately prior to discharge at my instruction .Patient asymptomatic. Case discussed with hospital pharmacist suggest increased clonidine to 0.1 mg 3 times daily. She currently takes twice daily. She should get her blood pressure rechecked by her PCP in one week Diagnosis hypertension Final diagnoses:  None     I personally performed the services described in this documentation, which was scribed in my presence. The recorded information has been reviewed and considered.     Orlie Dakin, MD 04/22/16 2240

## 2016-04-22 NOTE — ED Notes (Signed)
MD at bedside. 

## 2016-04-22 NOTE — Discharge Instructions (Signed)
Hypertension Increase your clonidine to 3 times daily. Take it each day at 7 AM, 3 PM and 10 PM. You should get your blood pressure rechecked in a week at your primary care physician's office. Call tomorrow to schedule appointment. Return if you feel worse for any reason or see your primary care physician. Hypertension, commonly called high blood pressure, is when the force of blood pumping through your arteries is too strong. Your arteries are the blood vessels that carry blood from your heart throughout your body. A blood pressure reading consists of a higher number over a lower number, such as 110/72. The higher number (systolic) is the pressure inside your arteries when your heart pumps. The lower number (diastolic) is the pressure inside your arteries when your heart relaxes. Ideally you want your blood pressure below 120/80. Hypertension forces your heart to work harder to pump blood. Your arteries may become narrow or stiff. Having untreated or uncontrolled hypertension can cause heart attack, stroke, kidney disease, and other problems. RISK FACTORS Some risk factors for high blood pressure are controllable. Others are not.  Risk factors you cannot control include:   Race. You may be at higher risk if you are African American.  Age. Risk increases with age.  Gender. Men are at higher risk than women before age 66 years. After age 78, women are at higher risk than men. Risk factors you can control include:  Not getting enough exercise or physical activity.  Being overweight.  Getting too much fat, sugar, calories, or salt in your diet.  Drinking too much alcohol. SIGNS AND SYMPTOMS Hypertension does not usually cause signs or symptoms. Extremely high blood pressure (hypertensive crisis) may cause headache, anxiety, shortness of breath, and nosebleed. DIAGNOSIS To check if you have hypertension, your health care provider will measure your blood pressure while you are seated, with your  arm held at the level of your heart. It should be measured at least twice using the same arm. Certain conditions can cause a difference in blood pressure between your right and left arms. A blood pressure reading that is higher than normal on one occasion does not mean that you need treatment. If it is not clear whether you have high blood pressure, you may be asked to return on a different day to have your blood pressure checked again. Or, you may be asked to monitor your blood pressure at home for 1 or more weeks. TREATMENT Treating high blood pressure includes making lifestyle changes and possibly taking medicine. Living a healthy lifestyle can help lower high blood pressure. You may need to change some of your habits. Lifestyle changes may include:  Following the DASH diet. This diet is high in fruits, vegetables, and whole grains. It is low in salt, red meat, and added sugars.  Keep your sodium intake below 2,300 mg per day.  Getting at least 30-45 minutes of aerobic exercise at least 4 times per week.  Losing weight if necessary.  Not smoking.  Limiting alcoholic beverages.  Learning ways to reduce stress. Your health care provider may prescribe medicine if lifestyle changes are not enough to get your blood pressure under control, and if one of the following is true:  You are 52-40 years of age and your systolic blood pressure is above 140.  You are 29 years of age or older, and your systolic blood pressure is above 150.  Your diastolic blood pressure is above 90.  You have diabetes, and your systolic blood pressure is  over 654 or your diastolic blood pressure is over 90.  You have kidney disease and your blood pressure is above 140/90.  You have heart disease and your blood pressure is above 140/90. Your personal target blood pressure may vary depending on your medical conditions, your age, and other factors. HOME CARE INSTRUCTIONS  Have your blood pressure rechecked as  directed by your health care provider.   Take medicines only as directed by your health care provider. Follow the directions carefully. Blood pressure medicines must be taken as prescribed. The medicine does not work as well when you skip doses. Skipping doses also puts you at risk for problems.  Do not smoke.   Monitor your blood pressure at home as directed by your health care provider. SEEK MEDICAL CARE IF:   You think you are having a reaction to medicines taken.  You have recurrent headaches or feel dizzy.  You have swelling in your ankles.  You have trouble with your vision. SEEK IMMEDIATE MEDICAL CARE IF:  You develop a severe headache or confusion.  You have unusual weakness, numbness, or feel faint.  You have severe chest or abdominal pain.  You vomit repeatedly.  You have trouble breathing. MAKE SURE YOU:   Understand these instructions.  Will watch your condition.  Will get help right away if you are not doing well or get worse.   This information is not intended to replace advice given to you by your health care provider. Make sure you discuss any questions you have with your health care provider.   Document Released: 09/20/2005 Document Revised: 02/04/2015 Document Reviewed: 07/13/2013 Elsevier Interactive Patient Education [employer].

## 2016-04-22 NOTE — ED Notes (Signed)
Pt states BP was elevated at dialysis yesterday-is compliant with meds-NAD-steady gait

## 2016-04-22 NOTE — ED Notes (Signed)
Pt denies any symptoms to registration, in no acute distress in lobby, on cell phone

## 2017-03-13 ENCOUNTER — Emergency Department (HOSPITAL_BASED_OUTPATIENT_CLINIC_OR_DEPARTMENT_OTHER)
Admission: EM | Admit: 2017-03-13 | Discharge: 2017-03-13 | Disposition: A | Discharge status: Other Acute Inpatient Hospital | Payer: BLUE CROSS/BLUE SHIELD | Attending: Emergency Medicine | Admitting: Emergency Medicine

## 2017-03-13 ENCOUNTER — Encounter (HOSPITAL_BASED_OUTPATIENT_CLINIC_OR_DEPARTMENT_OTHER): Payer: Self-pay | Admitting: Emergency Medicine

## 2017-03-13 ENCOUNTER — Emergency Department (HOSPITAL_BASED_OUTPATIENT_CLINIC_OR_DEPARTMENT_OTHER): Payer: BLUE CROSS/BLUE SHIELD

## 2017-03-13 DIAGNOSIS — Z794 Long term (current) use of insulin: Secondary | ICD-10-CM | POA: Insufficient documentation

## 2017-03-13 DIAGNOSIS — Z992 Dependence on renal dialysis: Secondary | ICD-10-CM | POA: Insufficient documentation

## 2017-03-13 DIAGNOSIS — R079 Chest pain, unspecified: Secondary | ICD-10-CM | POA: Diagnosis present

## 2017-03-13 DIAGNOSIS — Z951 Presence of aortocoronary bypass graft: Secondary | ICD-10-CM | POA: Diagnosis not present

## 2017-03-13 DIAGNOSIS — N186 End stage renal disease: Secondary | ICD-10-CM | POA: Diagnosis not present

## 2017-03-13 DIAGNOSIS — I120 Hypertensive chronic kidney disease with stage 5 chronic kidney disease or end stage renal disease: Secondary | ICD-10-CM | POA: Insufficient documentation

## 2017-03-13 DIAGNOSIS — I6529 Occlusion and stenosis of unspecified carotid artery: Secondary | ICD-10-CM | POA: Insufficient documentation

## 2017-03-13 DIAGNOSIS — Z7902 Long term (current) use of antithrombotics/antiplatelets: Secondary | ICD-10-CM | POA: Insufficient documentation

## 2017-03-13 DIAGNOSIS — Z7982 Long term (current) use of aspirin: Secondary | ICD-10-CM | POA: Diagnosis not present

## 2017-03-13 DIAGNOSIS — I2510 Atherosclerotic heart disease of native coronary artery without angina pectoris: Secondary | ICD-10-CM | POA: Insufficient documentation

## 2017-03-13 DIAGNOSIS — R0789 Other chest pain: Secondary | ICD-10-CM | POA: Insufficient documentation

## 2017-03-13 DIAGNOSIS — E1122 Type 2 diabetes mellitus with diabetic chronic kidney disease: Secondary | ICD-10-CM | POA: Diagnosis not present

## 2017-03-13 DIAGNOSIS — Z79899 Other long term (current) drug therapy: Secondary | ICD-10-CM | POA: Insufficient documentation

## 2017-03-13 HISTORY — DX: Essential (primary) hypertension: I10

## 2017-03-13 LAB — BASIC METABOLIC PANEL
ANION GAP: 14 (ref 5–15)
BUN: 37 mg/dL — ABNORMAL HIGH (ref 6–20)
CHLORIDE: 104 mmol/L (ref 101–111)
CO2: 22 mmol/L (ref 22–32)
CREATININE: 7.78 mg/dL — AB (ref 0.44–1.00)
Calcium: 8.9 mg/dL (ref 8.9–10.3)
GFR calc non Af Amer: 5 mL/min — ABNORMAL LOW (ref >60)
GFR, EST AFRICAN AMERICAN: 6 mL/min — AB (ref >60)
Glucose, Bld: 103 mg/dL — ABNORMAL HIGH (ref 65–99)
POTASSIUM: 4.2 mmol/L (ref 3.5–5.1)
SODIUM: 140 mmol/L (ref 135–145)

## 2017-03-13 LAB — CBC
HEMATOCRIT: 37 % (ref 36.0–46.0)
HEMOGLOBIN: 11.8 g/dL — AB (ref 12.0–15.0)
MCH: 29.5 pg (ref 26.0–34.0)
MCHC: 31.9 g/dL (ref 30.0–36.0)
MCV: 92.5 fL (ref 78.0–100.0)
PLATELETS: 253 10*3/uL (ref 150–400)
RBC: 4 MIL/uL (ref 3.87–5.11)
RDW: 15.6 % — ABNORMAL HIGH (ref 11.5–15.5)
WBC: 10.9 10*3/uL — AB (ref 4.0–10.5)

## 2017-03-13 LAB — TROPONIN I
TROPONIN I: 0.12 ng/mL — AB (ref <0.03)
Troponin I: 0.03 ng/mL (ref <0.03)
Troponin I: 0.07 ng/mL (ref <0.03)

## 2017-03-13 MED ORDER — ASPIRIN 81 MG PO CHEW: 324.0000 mg | CHEWABLE_TABLET | Freq: Once | ORAL | Status: DC

## 2017-03-13 MED ORDER — NITROGLYCERIN 0.4 MG SL SUBL: 0.4000 mg | SUBLINGUAL_TABLET | SUBLINGUAL | Status: DC | PRN

## 2017-03-13 NOTE — ED Notes (Signed)
Date and time results received: 03/13/17 1405 (use smartphrase ".now" to insert current time)  Test: Troponin Critical Value:0.07  Name of Provider Notified: Dr. Tyrone Nine & Marylynn Pearson, RN  Orders Received? Or Actions Taken?: No new order received.

## 2017-03-13 NOTE — ED Notes (Signed)
Date and time results received: 03/13/17 1125 (use smartphrase ".now" to insert current time)  Test: troponin Critical Value: 0.03  Name of Provider Notified: Dr. Tyrone Nine & Marylynn Pearson, RN  Orders Received? Or Actions Taken?: No new orders received.

## 2017-03-13 NOTE — ED Triage Notes (Signed)
Chest/shoulder pressure x 1 hour with SOB, hx of CABG x 3 in 2015.

## 2017-03-13 NOTE — ED Provider Notes (Signed)
[facility]PT [facility] Provider Note   CSN: [id number] Arrival date & time: 03/13/17  1001     History   Chief Complaint Chief Complaint  Patient presents with  . Chest Pain    HPI Isabel Sharp is a 46 y.o. female.  46 yo F with a chief complaint of chest pain and shortness of breath. Going on since this morning. She woke up in her recliner and felt a pressure on her chest. Feels that radiates slightly to her shoulder blades. Denies any diaphoresis nausea or vomiting. She thinks it's different from her prior MI because the pain is more localized. She has not exerting herself. Felt like it got better with breathing treatments.   The history is provided by the patient.  Chest Pain   This is a new problem. The current episode started 1 to 2 hours ago. The problem occurs constantly. The problem has not changed since onset.The pain is associated with rest. The pain is present in the substernal region. The pain is at a severity of 8/10. The pain is moderate. The quality of the pain is described as pressure-like. The pain does not radiate. Duration of episode(s) is 2 hours. Associated symptoms include shortness of breath. Pertinent negatives include no dizziness, no fever, no headaches, no nausea, no palpitations and no vomiting. Treatments tried: breathing treatment. The treatment provided mild relief.  Her past medical history is significant for CAD and MI.    Past Medical History:  Diagnosis Date  . Carotid artery disease ([place])   . Coronary artery disease   . 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 End-stage renal disease on hemodialysis ([place])   . Hypertension     Patient Active Problem List   Diagnosis Date Noted  . HCAP (healthcare-associated pneumonia) 02/18/2016  . Altered mental status   . Uncontrolled type 2 diabetes mellitus with complication ([place])   . Elevated blood pressure   . FUO (fever of unknown origin)   . Acute encephalopathy 02/01/2016  .  SIRS (systemic inflammatory response syndrome) ([place]) 02/01/2016  . Hyperlipidemia 02/01/2016  . Bilateral carotid artery disease ([place]) 02/01/2016  . Fever   . Acute appendicitis 01/31/2016  . ESRD (end stage renal disease) ([place]) 02/24/2014  . Hyperkalemia 02/24/2014  . HTN (hypertension) 02/24/2014  . DM type 2 causing renal disease ([place]) 02/24/2014  . Nausea and vomiting 02/24/2014  . CAD (coronary artery disease) 02/24/2014  . Elevated troponin 02/24/2014    Past Surgical History:  Procedure Laterality Date  . CARDIAC CATHETERIZATION  02/21/14   done at [facility]-  . CAROTID ENDARTERECTOMY Left 04/30/2015  . CESAREAN SECTION    . CORONARY ARTERY BYPASS GRAFT  02/24/14   essel coronary artery disease and had bypass grafting in May 2015 with a mammary to the LAD, a saphenous vein graft to the OM and a saphenous vein graft to the diagonal  . LAPAROSCOPIC APPENDECTOMY N/A 01/30/2016   Procedure: APPENDECTOMY LAPAROSCOPIC;  Surgeon: Arta Bruce Kinsinger, MD;  Location: [facility];  Service: General;  Laterality: N/A;  . LAPAROSCOPIC BILATERAL SALPINGO OOPHERECTOMY      OB History    No data available       Home Medications    Prior to Admission medications   Medication Sig Start Date End Date Taking? Authorizing Provider  acetaminophen (TYLENOL) 325 MG tablet Take 325 mg by mouth 2 (two) times daily as needed (pain).    [provider]  aspirin 325 MG tablet Take  325 mg by mouth daily.    [provider]  bumetanide (BUMEX) 2 MG tablet Take 2 mg by mouth daily.    [provider]  carvedilol (COREG) 25 MG tablet Take 25 mg by mouth 2 (two) times daily.     [provider]  Cholecalciferol (VITAMIN D3) 1000 UNITS CAPS Take by mouth every morning.    [provider]  cloNIDine (CATAPRES) 0.1 MG tablet Take 1 tablet (0.1 mg total) by mouth daily. 02/22/16   Bonnielee Haff, MD  diltiazem (CARDIZEM CD) 300 MG 24 hr capsule Take 300 mg by mouth  daily.    [provider]  DOCOSAHEXAENOIC ACID PO Take 1 capsule by mouth every morning.    [provider]  docusate sodium (COLACE) 100 MG capsule Take 100 mg by mouth 2 (two) times daily.    [provider]  epoetin alfa (EPOGEN,PROCRIT) 38250 UNIT/ML injection Inject 10,000 Units into the skin every Monday, Wednesday, and Friday.     [provider]  Ferric Citrate (AURYXIA) 1 GM 210 MG(Fe) TABS Take 210 mg by mouth 3 (three) times daily with meals.     [provider]  glipiZIDE (GLUCOTROL) 10 MG tablet Take 10 mg by mouth daily before breakfast.     [provider]  hydrALAZINE (APRESOLINE) 100 MG tablet Take 100 mg by mouth 3 (three) times daily.    [provider]  insulin glargine (LANTUS) 100 UNIT/ML injection Inject 15 Units into the skin every morning. > 140    [provider]  isosorbide dinitrate (ISORDIL) 5 MG tablet Take 2 tablets (10 mg total) by mouth 3 (three) times daily. 02/22/16   Bonnielee Haff, MD  lidocaine-prilocaine (EMLA) cream Apply 1 application topically every Monday, Wednesday, and Friday. 01/01/16   [provider]  losartan (COZAAR) 50 MG tablet Take 2 tablets (100 mg total) by mouth daily. 02/06/16 03/07/16  Caren Griffins, MD  Omega-3 Krill Oil 500 MG CAPS Take 500 mg by mouth daily.     [provider]  OVER THE COUNTER MEDICATION Take 30 mLs by mouth every Monday, Wednesday, and Friday.    [provider]  polyethylene glycol (MIRALAX / GLYCOLAX) packet Take 17 g by mouth daily as needed for mild constipation.    [provider]  pravastatin (PRAVACHOL) 40 MG tablet Take 40 mg by mouth at bedtime.    [provider]  sevelamer carbonate (RENVELA) 800 MG tablet Take 2,400 mg by mouth 3 (three) times daily with meals.    [provider]    Family History Family History  Problem Relation Age of Onset  . Heart attack Mother   . Heart  attack Father   . Hypertension Father   . Heart attack Maternal Grandmother   . Heart attack Maternal Grandfather     Social History Social History  Substance Use Topics  . Smoking status: Never Smoker  . Smokeless tobacco: Never Used  . Alcohol use No     Allergies   Norvasc [amlodipine] and Vicks blue cough lozenges [menthol]   Review of Systems Review of Systems  Constitutional: Negative for chills and fever.  HENT: Negative for congestion and rhinorrhea.   Eyes: Negative for redness and visual disturbance.  Respiratory: Positive for shortness of breath. Negative for wheezing.   Cardiovascular: Positive for chest pain. Negative for palpitations.  Gastrointestinal: Negative for nausea and vomiting.  Genitourinary: Negative for dysuria and urgency.  Musculoskeletal: Negative for arthralgias  and myalgias.  Skin: Negative for pallor and wound.  Neurological: Negative for dizziness and headaches.     Physical Exam Updated Vital Signs BP (!) 185/79   Pulse 82   Temp 98.6 F (37 C) (Oral)   Resp (!) 28   SpO2 97%   Physical Exam  Constitutional: She is oriented to person, place, and time. She appears well-developed and well-nourished. No distress.  HENT:  Head: Normocephalic and atraumatic.  Eyes: EOM are normal. Pupils are equal, round, and reactive to light.  Neck: Normal range of motion. Neck supple.  Cardiovascular: Normal rate and regular rhythm.  Exam reveals no gallop and no friction rub.   No murmur heard. Pulmonary/Chest: Effort normal. She has no wheezes. She has no rales. She exhibits no tenderness.  Abdominal: Soft. She exhibits no distension and no mass. There is no tenderness. There is no guarding.  Musculoskeletal: She exhibits no edema or tenderness.  L AV fistula with palpable thrill   Neurological: She is alert and oriented to person, place, and time.  Skin: Skin is warm and dry. She is not diaphoretic.  Psychiatric: She has a normal mood and  affect. Her behavior is normal.  Nursing note and vitals reviewed.    ED Treatments / Results  Labs (all labs ordered are listed, but only abnormal results are displayed) Labs Reviewed  BASIC METABOLIC PANEL - Abnormal; Notable for the following:       Result Value   Glucose, Bld 103 (*)    BUN 37 (*)    Creatinine, Ser 7.78 (*)    GFR calc non Af Amer 5 (*)    GFR calc Af Amer 6 (*)    All other components within normal limits  CBC - Abnormal; Notable for the following:    WBC 10.9 (*)    Hemoglobin 11.8 (*)    RDW 15.6 (*)    All other components within normal limits  TROPONIN I - Abnormal; Notable for the following:    Troponin I 0.03 (*)    All other components within normal limits  TROPONIN I - Abnormal; Notable for the following:    Troponin I 0.07 (*)    All other components within normal limits  TROPONIN I    EKG  EKG Interpretation  Date/Time:  Sunday March 13 2017 14:47:51 EDT Ventricular Rate:  78 PR Interval:    QRS Duration: 113 QT Interval:  456 QTC Calculation: 520 R Axis:   -33 Text Interpretation:  Sinus rhythm LVH with secondary repolarization abnormality Prolonged QT interval ST depression in the inferior and lateral leads compared to prior Otherwise no significant change Confirmed by Melene Plan [phone]) on 03/13/2017 2:53:56 PM       Radiology Dg Chest 2 View  Result Date: 03/13/2017 CLINICAL DATA:  Chest and shoulder pressure for 1 hour. Shortness of breath. EXAM: CHEST  2 VIEW COMPARISON:  Single-view of the chest 12/03/2016 and 09/13/2016. FINDINGS: There is cardiomegaly and mild interstitial edema. The patient is status post CABG. No pneumothorax or pleural fluid. IMPRESSION: Cardiomegaly and mild interstitial edema. Electronically Signed   By: Drusilla Kanner M.D.   On: 03/13/2017 10:35    Procedures Procedures (including critical care time)  Medications Ordered in ED Medications  aspirin chewable tablet 324 mg (324 mg Oral Not Given  03/13/17 1238)  nitroGLYCERIN (NITROSTAT) SL tablet 0.4 mg (not administered)     Initial Impression / Assessment and Plan / ED Course  I have reviewed the  triage vital signs and the nursing notes.  Pertinent labs & imaging results that were available during my care of the patient were reviewed by me and considered in my medical decision making (see chart for details).  Clinical Course as of Mar 13 1510  Sun Mar 13, 2017  1203 I discussed the case with Dr. Erline Levine, cardiologist at [facility]. With the patient currently symptom-free and EKG unchanged and troponin well below her baseline recommended a delta if negative feels that she is safe for outpatient follow-up.  [DF]  1501 The patient ambulated to the bathroom and had sudden worsening of her chest pain. Repeat ECG had some concerning ST depressions in the inferior and lateral leads.  [DF]    Clinical Course User Index [DF] Deno Etienne, DO    46 yo F With a chief complaints of chest pain and shortness of breath. Patient has a history of a 3 way bypass as well as a stent 3 months ago. Patient states that she's been compliant with her dialysis.  The patient ambulated to the bathroom and had sudden worsening of her pain. I discussed the case with cardiology at [facility] to accept the patient in transfer.  CRITICAL CARE Performed by: Cecilio Asper   Total critical care time: 35 minutes  Critical care time was exclusive of separately billable procedures and treating other patients.  Critical care was necessary to treat or prevent imminent or life-threatening deterioration.  Critical care was time spent personally by me on the following activities: development of treatment plan with patient and/or surrogate as well as nursing, discussions with consultants, evaluation of patient's response to treatment, examination of patient, obtaining history from patient or surrogate, ordering and performing treatments and interventions, ordering and  review of laboratory studies, ordering and review of radiographic studies, pulse oximetry and re-evaluation of patient's condition.  The patients results and plan were reviewed and discussed.   Any x-rays performed were independently reviewed by myself.   Differential diagnosis were considered with the presenting HPI.  Medications  aspirin chewable tablet 324 mg (324 mg Oral Not Given 03/13/17 1238)  nitroGLYCERIN (NITROSTAT) SL tablet 0.4 mg (not administered)    Vitals:   03/13/17 1330 03/13/17 1400 03/13/17 1433 03/13/17 1446  BP: (!) 184/76 (!) 164/74 (!) 191/76 (!) 185/79  Pulse: 64 65 66 82  Resp: 19 (!) 25 20 (!) 28  Temp:      TempSrc:      SpO2: 94% 97% 97% 97%    Final diagnoses:  Chest pain, cardiac    Admission/ observation were discussed with the admitting physician, patient and/or family and they are comfortable with the plan.   Final Clinical Impressions(s) / ED Diagnoses   Final diagnoses:  Chest pain, cardiac    New Prescriptions New Prescriptions   No medications on file     Deno Etienne, DO 03/13/17 1511

## 2017-03-13 NOTE — ED Notes (Signed)
1430 - 1500 - Patient back to bed and BP elevated - no distress noted at the time that she returned to bed, the patient once in bed started to have pain to her chest  - called out x 2 for assistance. Patient reports the second time that pain is decreasing. The patient BP is decreased at 1445. EKG done at 1445 and given to MD - update to the MD at 1450 and and family updated as to the plan of care

## 2017-03-13 NOTE — ED Notes (Signed)
Patient ambulatory to the restroom without any noted distress

## 2017-03-13 NOTE — ED Notes (Signed)
Pt receives dialysis on MWF.

## 2017-03-13 NOTE — ED Notes (Signed)
Attempted to give report, RN needed 10-15 mins, in with another pt

## 2017-05-31 ENCOUNTER — Emergency Department (HOSPITAL_BASED_OUTPATIENT_CLINIC_OR_DEPARTMENT_OTHER): Payer: BLUE CROSS/BLUE SHIELD

## 2017-05-31 ENCOUNTER — Encounter (HOSPITAL_BASED_OUTPATIENT_CLINIC_OR_DEPARTMENT_OTHER): Payer: Self-pay | Admitting: *Deleted

## 2017-05-31 ENCOUNTER — Emergency Department (HOSPITAL_BASED_OUTPATIENT_CLINIC_OR_DEPARTMENT_OTHER)
Admission: EM | Admit: 2017-05-31 | Discharge: 2017-06-01 | Disposition: A | Discharge status: Other Acute Inpatient Hospital | Payer: BLUE CROSS/BLUE SHIELD | Attending: Emergency Medicine | Admitting: Emergency Medicine

## 2017-05-31 DIAGNOSIS — Z951 Presence of aortocoronary bypass graft: Secondary | ICD-10-CM | POA: Insufficient documentation

## 2017-05-31 DIAGNOSIS — Z992 Dependence on renal dialysis: Secondary | ICD-10-CM | POA: Diagnosis not present

## 2017-05-31 DIAGNOSIS — Z7902 Long term (current) use of antithrombotics/antiplatelets: Secondary | ICD-10-CM | POA: Insufficient documentation

## 2017-05-31 DIAGNOSIS — Z79899 Other long term (current) drug therapy: Secondary | ICD-10-CM | POA: Diagnosis not present

## 2017-05-31 DIAGNOSIS — N184 Chronic kidney disease, stage 4 (severe): Secondary | ICD-10-CM | POA: Insufficient documentation

## 2017-05-31 DIAGNOSIS — Z7982 Long term (current) use of aspirin: Secondary | ICD-10-CM | POA: Insufficient documentation

## 2017-05-31 DIAGNOSIS — E1122 Type 2 diabetes mellitus with diabetic chronic kidney disease: Secondary | ICD-10-CM | POA: Insufficient documentation

## 2017-05-31 DIAGNOSIS — Z794 Long term (current) use of insulin: Secondary | ICD-10-CM | POA: Insufficient documentation

## 2017-05-31 DIAGNOSIS — I129 Hypertensive chronic kidney disease with stage 1 through stage 4 chronic kidney disease, or unspecified chronic kidney disease: Secondary | ICD-10-CM | POA: Diagnosis not present

## 2017-05-31 DIAGNOSIS — R079 Chest pain, unspecified: Secondary | ICD-10-CM | POA: Insufficient documentation

## 2017-05-31 DIAGNOSIS — I2510 Atherosclerotic heart disease of native coronary artery without angina pectoris: Secondary | ICD-10-CM | POA: Diagnosis not present

## 2017-05-31 LAB — BASIC METABOLIC PANEL
ANION GAP: 12 (ref 5–15)
BUN: 22 mg/dL — ABNORMAL HIGH (ref 6–20)
CHLORIDE: 97 mmol/L — AB (ref 101–111)
CO2: 24 mmol/L (ref 22–32)
Calcium: 8.2 mg/dL — ABNORMAL LOW (ref 8.9–10.3)
Creatinine, Ser: 6.3 mg/dL — ABNORMAL HIGH (ref 0.44–1.00)
GFR calc non Af Amer: 7 mL/min — ABNORMAL LOW (ref >60)
GFR, EST AFRICAN AMERICAN: 8 mL/min — AB (ref >60)
GLUCOSE: 276 mg/dL — AB (ref 65–99)
Potassium: 4.7 mmol/L (ref 3.5–5.1)
Sodium: 133 mmol/L — ABNORMAL LOW (ref 135–145)

## 2017-05-31 LAB — CBC
HCT: 29.4 % — ABNORMAL LOW (ref 36.0–46.0)
HEMOGLOBIN: 8.9 g/dL — AB (ref 12.0–15.0)
MCH: 26.5 pg (ref 26.0–34.0)
MCHC: 30.3 g/dL (ref 30.0–36.0)
MCV: 87.5 fL (ref 78.0–100.0)
Platelets: 286 10*3/uL (ref 150–400)
RBC: 3.36 MIL/uL — AB (ref 3.87–5.11)
RDW: 19.4 % — ABNORMAL HIGH (ref 11.5–15.5)
WBC: 11.7 10*3/uL — ABNORMAL HIGH (ref 4.0–10.5)

## 2017-05-31 LAB — TROPONIN I
Troponin I: 0.05 ng/mL (ref <0.03)
Troponin I: 0.11 ng/mL (ref <0.03)

## 2017-05-31 MED ORDER — HYDROMORPHONE HCL 1 MG/ML IJ SOLN
0.5000 mg | Freq: Once | INTRAMUSCULAR | Status: AC
  Administered 2017-05-31: 0.5 mg via INTRAVENOUS
  Filled 2017-05-31: qty 1

## 2017-05-31 MED ORDER — HEPARIN BOLUS VIA INFUSION
2000.0000 [IU] | Freq: Once | INTRAVENOUS | Status: AC
  Administered 2017-05-31: 2000 [IU] via INTRAVENOUS

## 2017-05-31 MED ORDER — NITROGLYCERIN 2 % TD OINT
2.0000 [in_us] | TOPICAL_OINTMENT | Freq: Four times a day (QID) | TRANSDERMAL | Status: DC
  Administered 2017-05-31: 2 [in_us] via TOPICAL
  Filled 2017-05-31: qty 1

## 2017-05-31 MED ORDER — ASPIRIN 81 MG PO CHEW
324.0000 mg | CHEWABLE_TABLET | Freq: Once | ORAL | Status: AC
  Administered 2017-05-31: 324 mg via ORAL
  Filled 2017-05-31: qty 4

## 2017-05-31 MED ORDER — ISOSORBIDE MONONITRATE ER 30 MG PO TB24
30.0000 mg | ORAL_TABLET | Freq: Every day | ORAL | Status: DC
  Filled 2017-05-31: qty 1

## 2017-05-31 MED ORDER — HEPARIN (PORCINE) IN NACL 100-0.45 UNIT/ML-% IJ SOLN
900.0000 [IU]/h | INTRAMUSCULAR | Status: DC
  Administered 2017-05-31: 900 [IU]/h via INTRAVENOUS
  Filled 2017-05-31: qty 250

## 2017-05-31 MED ORDER — NITROGLYCERIN IN D5W 200-5 MCG/ML-% IV SOLN
0.0000 ug/min | Freq: Once | INTRAVENOUS | Status: AC
  Administered 2017-05-31: 5 ug/min via INTRAVENOUS
  Filled 2017-05-31: qty 250

## 2017-05-31 MED ORDER — NITROGLYCERIN 0.4 MG SL SUBL
0.4000 mg | SUBLINGUAL_TABLET | SUBLINGUAL | Status: AC | PRN
  Administered 2017-05-31 (×3): 0.4 mg via SUBLINGUAL
  Filled 2017-05-31: qty 1

## 2017-05-31 NOTE — ED Notes (Addendum)
Pt states Lt arm is restricted, arm band placed

## 2017-05-31 NOTE — ED Notes (Signed)
Pt on monitor 

## 2017-05-31 NOTE — ED Provider Notes (Signed)
[facility] [facility] DEPT [facility] Provider Note   CSN: [id number] Arrival date & time: 05/31/17  1846     History   Chief Complaint Chief Complaint  Patient presents with  . Chest Pain    HPI Isabel Sharp is a 46 y.o. female.  46 yo F with a chief complaints of left-sided sharp chest pain. Worse with deep breaths movement palpation. Going on for the past 2 hours. Patient has been going to dialysis as scheduled but they have been taking less fluid off. She has some shortness of breath with this. Has not tried nitroglycerin and she says she did not have any around. Denies trauma. Denies hemoptysis. Denies cough or congestion.   The history is provided by the patient.  Chest Pain   This is a new problem. The current episode started 2 days ago. The problem occurs constantly. The problem has been gradually worsening. The pain is associated with movement and raising an arm. The pain is present in the lateral region. The pain is at a severity of 8/10. The pain is moderate. The quality of the pain is described as sharp. The pain does not radiate. Duration of episode(s) is 2 hours. Pertinent negatives include no dizziness, no fever, no headaches, no nausea, no palpitations, no shortness of breath and no vomiting. She has tried nothing for the symptoms. The treatment provided no relief.    Past Medical History:  Diagnosis Date  . Carotid artery disease ([place])   . Coronary artery disease   . 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 End-stage renal disease on hemodialysis ([place])   . Hypertension     Patient Active Problem List   Diagnosis Date Noted  . HCAP (healthcare-associated pneumonia) 02/18/2016  . Altered mental status   . Uncontrolled type 2 diabetes mellitus with complication ([place])   . Elevated blood pressure   . FUO (fever of unknown origin)   . Acute encephalopathy 02/01/2016  . SIRS (systemic inflammatory response syndrome) ([place]) 02/01/2016  . Hyperlipidemia  02/01/2016  . Bilateral carotid artery disease ([place]) 02/01/2016  . Fever   . Acute appendicitis 01/31/2016  . ESRD (end stage renal disease) ([place]) 02/24/2014  . Hyperkalemia 02/24/2014  . HTN (hypertension) 02/24/2014  . DM type 2 causing renal disease ([place]) 02/24/2014  . Nausea and vomiting 02/24/2014  . CAD (coronary artery disease) 02/24/2014  . Elevated troponin 02/24/2014    Past Surgical History:  Procedure Laterality Date  . CARDIAC CATHETERIZATION  02/21/14   done at [facility]-  . CAROTID ENDARTERECTOMY Left 04/30/2015  . CESAREAN SECTION    . CORONARY ARTERY BYPASS GRAFT  02/24/14   essel coronary artery disease and had bypass grafting in May 2015 with a mammary to the LAD, a saphenous vein graft to the OM and a saphenous vein graft to the diagonal  . LAPAROSCOPIC APPENDECTOMY N/A 01/30/2016   Procedure: APPENDECTOMY LAPAROSCOPIC;  Surgeon: Arta Bruce Kinsinger, MD;  Location: [facility];  Service: General;  Laterality: N/A;  . LAPAROSCOPIC BILATERAL [place] OOPHERECTOMY      OB History    No data available       Home Medications    Prior to Admission medications   Medication Sig Start Date End Date Taking? Authorizing Provider  losartan (COZAAR) 100 MG tablet Take 100 mg by mouth daily.   Yes [provider]  rosuvastatin (CRESTOR) 20 MG tablet Take 20 mg by mouth daily.   Yes [provider]  Ticagrelor (BRILINTA PO) Take  by mouth.   Yes [provider]  acetaminophen (TYLENOL) 325 MG tablet Take 325 mg by mouth 2 (two) times daily as needed (pain).    [provider]  aspirin 325 MG tablet Take 325 mg by mouth daily.    [provider]  bumetanide (BUMEX) 2 MG tablet Take 2 mg by mouth daily.    [provider]  carvedilol (COREG) 25 MG tablet Take 25 mg by mouth 2 (two) times daily.     [provider]  Cholecalciferol (VITAMIN D3) 1000 UNITS CAPS Take by mouth every morning.    [provider]    cloNIDine (CATAPRES) 0.1 MG tablet Take 1 tablet (0.1 mg total) by mouth daily. 02/22/16   Bonnielee Haff, MD  diltiazem (CARDIZEM CD) 300 MG 24 hr capsule Take 300 mg by mouth daily.    [provider]  DOCOSAHEXAENOIC ACID PO Take 1 capsule by mouth every morning.    [provider]  docusate sodium (COLACE) 100 MG capsule Take 100 mg by mouth 2 (two) times daily.    [provider]  epoetin alfa (EPOGEN,PROCRIT) 25366 UNIT/ML injection Inject 10,000 Units into the skin every Monday, Wednesday, and Friday.     [provider]  Ferric Citrate (AURYXIA) 1 GM 210 MG(Fe) TABS Take 210 mg by mouth 3 (three) times daily with meals.     [provider]  glipiZIDE (GLUCOTROL) 10 MG tablet Take 10 mg by mouth daily before breakfast.     [provider]  hydrALAZINE (APRESOLINE) 100 MG tablet Take 100 mg by mouth 3 (three) times daily.    [provider]  insulin glargine (LANTUS) 100 UNIT/ML injection Inject 15 Units into the skin every morning. > 140    [provider]  isosorbide dinitrate (ISORDIL) 5 MG tablet Take 2 tablets (10 mg total) by mouth 3 (three) times daily. 02/22/16   Bonnielee Haff, MD  lidocaine-prilocaine (EMLA) cream Apply 1 application topically every Monday, Wednesday, and Friday. 01/01/16   [provider]  losartan (COZAAR) 50 MG tablet Take 2 tablets (100 mg total) by mouth daily. 02/06/16 03/07/16  Caren Griffins, MD  Omega-3 Krill Oil 500 MG CAPS Take 500 mg by mouth daily.     [provider]  OVER THE COUNTER MEDICATION Take 30 mLs by mouth every Monday, Wednesday, and Friday.    [provider]  polyethylene glycol (MIRALAX / GLYCOLAX) packet Take 17 g by mouth daily as needed for mild constipation.    [provider]  pravastatin (PRAVACHOL) 40 MG tablet Take 40 mg by mouth at bedtime.    [provider]  sevelamer carbonate (RENVELA) 800 MG tablet Take 2,400 mg  by mouth 3 (three) times daily with meals.    [provider]    Family History Family History  Problem Relation Age of Onset  . Heart attack Mother   . Heart attack Father   . Hypertension Father   . Heart attack Maternal Grandmother   . Heart attack Maternal Grandfather     Social History Social History  Substance Use Topics  . Smoking status: Never Smoker  . Smokeless tobacco: Never Used  . Alcohol use No     Allergies   Norvasc [amlodipine] and Vicks blue cough lozenges [menthol]   Review of Systems Review of Systems  Constitutional: Negative for chills and fever.  HENT: Negative for congestion and rhinorrhea.   Eyes: Negative for redness and visual disturbance.  Respiratory: Negative for shortness of breath and wheezing.   Cardiovascular: Positive for chest pain. Negative for palpitations.  Gastrointestinal: Negative for nausea and vomiting.  Genitourinary: Negative for dysuria and urgency.  Musculoskeletal: Negative for arthralgias and myalgias.  Skin: Negative for pallor and wound.  Neurological: Negative for dizziness and headaches.     Physical Exam Updated Vital Signs BP (!) 170/110 (BP Location: Right Arm)   Pulse 84   Temp 98.1 F (36.7 C) (Oral)   Resp (!) 28   Ht $R'5\' 2"'Ps$  (1.575 m)   Wt 69.4 kg (153 lb)   SpO2 97%   BMI 27.98 kg/m   Physical Exam  Constitutional: She is oriented to person, place, and time. She appears well-developed and well-nourished. No distress.  HENT:  Head: Normocephalic and atraumatic.  Eyes: Pupils are equal, round, and reactive to light. EOM are normal.  Neck: Normal range of motion. Neck supple.  Cardiovascular: Normal rate and regular rhythm.  Exam reveals no gallop and no friction rub.   No murmur heard. Pulmonary/Chest: Effort normal. She has no wheezes. She has rales (faint at bases). She exhibits tenderness (TTP about the left upper chest wall).  jvd to mid neck  Abdominal: Soft. She exhibits no  distension. There is no tenderness.  Musculoskeletal: She exhibits no edema or tenderness.  Neurological: She is alert and oriented to person, place, and time.  Skin: Skin is warm and dry. She is not diaphoretic.  Psychiatric: She has a normal mood and affect. Her behavior is normal.  Nursing note and vitals reviewed.    ED Treatments / Results  Labs (all labs ordered are listed, but only abnormal results are displayed) Labs Reviewed  BASIC METABOLIC PANEL - Abnormal; Notable for the following:       Result Value   Sodium 133 (*)    Chloride 97 (*)    Glucose, Bld 276 (*)    BUN 22 (*)    Creatinine, Ser 6.30 (*)    Calcium 8.2 (*)    GFR calc non Af Amer 7 (*)    GFR calc Af Amer 8 (*)    All other components within normal limits  CBC - Abnormal; Notable for the following:    WBC 11.7 (*)    RBC 3.36 (*)    Hemoglobin 8.9 (*)    HCT 29.4 (*)    RDW 19.4 (*)    All other components within normal limits  TROPONIN I - Abnormal; Notable for the following:    Troponin I 0.05 (*)    All other components within normal limits  TROPONIN I - Abnormal; Notable for the following:    Troponin I 0.11 (*)    All other components within normal limits    EKG  EKG Interpretation  Date/Time:  Tuesday May 31 2017 22:51:09 EDT Ventricular Rate:  88 PR Interval:  156 QRS Duration: 115 QT Interval:  415 QTC Calculation: 503 R Axis:   0 Text Interpretation:  Sinus rhythm Nonspecific intraventricular conduction delay Nonspecific repol abnormality, inferior leads Minimal ST elevation, anterior leads Artifact in lead(s) II III aVR aVF V2 V3 V5 and baseline wander in lead(s) V2 ? inferior lead depression compared to prior Otherwise no significant change Confirmed by Deno Etienne [phone]) on 05/31/2017 11:02:07 PM       Radiology Dg Chest 2 View  Result Date: 05/31/2017 CLINICAL DATA:  Chest pain with shortness of breath EXAM: CHEST  2 VIEW COMPARISON:  05/27/2017 FINDINGS: Post  sternotomy changes. Tiny pleural effusions best seen on lateral view. Cardiomegaly with mild diffuse interstitial opacities suspicious for edema. No pneumothorax. Left axillary and subclavian vascular stent IMPRESSION: Cardiomegaly with development of diffuse interstitial opacities suspicious for pulmonary edema. There are tiny pleural effusions. Electronically Signed   By: Donavan Foil M.D.   On: 05/31/2017 19:51    Procedures Procedures (including critical care time)  Medications Ordered in ED Medications  isosorbide mononitrate (IMDUR) 24 hr tablet 30 mg (30 mg Oral Not Given 05/31/17 2124)  nitroGLYCERIN (NITROGLYN) 2 % ointment 2 inch (2 inches Topical Given 05/31/17 2124)  heparin ADULT infusion 100 units/mL (25000 units/241mL sodium chloride 0.45%) (900 Units/hr Intravenous New Bag/Given 05/31/17 2343)  aspirin chewable tablet 324 mg (324 mg Oral Given 05/31/17 1952)  nitroGLYCERIN (NITROSTAT) SL tablet 0.4 mg (0.4 mg Sublingual Given 05/31/17 2015)  HYDROmorphone (DILAUDID) injection 0.5 mg (0.5 mg Intravenous Given 05/31/17 2057)  HYDROmorphone (DILAUDID) injection 0.5 mg (0.5 mg Intravenous Given 05/31/17 2247)  nitroGLYCERIN 50 mg in dextrose 5 % 250 mL (0.2 mg/mL) infusion (5 mcg/min Intravenous New Bag/Given 05/31/17 2335)  heparin bolus via infusion 2,000 Units (2,000 Units Intravenous Bolus from Bag 05/31/17 2338)     Initial Impression / Assessment and Plan / ED Course  I have reviewed the triage vital signs and the nursing notes.  Pertinent labs & imaging results that were available during my care of the patient were reviewed by me and considered in my medical decision making (see chart for details).     46 yo F With a chief complaint of atypical chest pain. This is reproduced on exam. However her symptoms seem to improve with nitroglycerin. She was significantly uncomfortable initially on exam. EKG without concerning changes. Chest x-ray with question of fluid overload but not  overtly fluid overloaded on exam. Will discuss the case with her cardiologist at [facility].  Discussed with Dr. Marvel Plan. Patient has known disease from her last cardiac cath. This makes her higher risk. He accepted the patient in transfer though they have limited bed availability currently. While in the ED the patient had worsening of her symptoms. Last EKG with questionable changes to the inferior leads as depression. Will start on nitroglycerin drip as well as heparin.  CRITICAL CARE Performed by: Cecilio Asper   Total critical care time: 80 minutes  Critical care time was exclusive of separately billable procedures and treating other patients.  Critical care was necessary to treat or prevent imminent or life-threatening deterioration.  Critical care was time spent personally by me on the following activities: development of treatment plan with patient and/or surrogate as well as nursing, discussions with consultants, evaluation of patient's response to treatment, examination of patient, obtaining history from patient or surrogate, ordering and performing treatments and interventions, ordering and review of laboratory studies, ordering and review of radiographic studies, pulse oximetry and re-evaluation of patient's condition.   Final Clinical Impressions(s) / ED Diagnoses   Final diagnoses:  Chest pain, cardiac    New Prescriptions New Prescriptions   No medications on file     Deno Etienne, DO 05/31/17 2347

## 2017-05-31 NOTE — ED Triage Notes (Signed)
Pain across her chest and shoulders for an hour. Cardiac hx.

## 2017-05-31 NOTE — Progress Notes (Signed)
ANTICOAGULATION CONSULT NOTE - Initial Consult  Pharmacy Consult for heparin Indication: chest pain/ACS  Allergies  Allergen Reactions  . Norvasc [Amlodipine] Swelling  . Vicks Blue Cough Lozenges [Menthol] Other (See Comments)    Makes her cough worse. She can you vicks vapor rub per pt    Patient Measurements: Height: 5\' 2"  (157.5 cm) Weight: 153 lb (69.4 kg) IBW/kg (Calculated) : 50.1 Heparin Dosing Weight: 65kg  Vital Signs: Temp: 98.1 F (36.7 C) (08/28 2252) Temp Source: Oral (08/28 2252) BP: 188/83 (08/28 2230) Pulse Rate: 86 (08/28 2230)  Labs:  Recent Labs  05/31/17 1925  HGB 8.9*  HCT 29.4*  PLT 286  CREATININE 6.30*  TROPONINI 0.05*    Estimated Creatinine Clearance: 10 mL/min (A) (by C-G formula based on SCr of 6.3 mg/dL (H)).   Medical History: Past Medical History:  Diagnosis Date  . Carotid artery disease ([place])   . Coronary artery disease   . 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 End-stage renal disease on hemodialysis ([place])   . Hypertension     Assessment: 46yo female w/ known CAD c/o pain across chest and shoulders, troponin elevated, to begin heparin.  Goal of Therapy:  Heparin level 0.3-0.7 units/ml Monitor platelets by anticoagulation protocol: Yes   Plan:  Will give heparin 2000 units IV bolus x1 followed by gtt at 900 units/hr and monitor heparin levels and CBC.  Wynona Neat, PharmD, BCPS  05/31/2017,11:06 PM

## 2017-06-01 LAB — TROPONIN I: TROPONIN I: 1.04 ng/mL — AB (ref <0.03)

## 2017-06-01 NOTE — ED Notes (Signed)
Pt took AM meds from home stock per EDP VO  ASA 81 mg,  Amlodipine 10 mg Glipizide, 10 mg, Clonidine 0.2 mg Isosorbide 120 mg, hydralazine 100 mg,  losartan 100 mg, Bumex 2 mg,  Brilinta 90 mg, Claritin 10 mg

## 2017-06-01 NOTE — ED Notes (Signed)
Per aircare patient cannot go to floor due to nitro drip.  Patient diverted to [facility] ED.  Report called to Ruby Cola, RN charge

## 2017-06-01 NOTE — ED Notes (Addendum)
EDP made aware of troponin

## 2017-12-28 IMAGING — DX DG CHEST 2V
2 series · 2 of 2 positions shown · non-contrast
Comparison: 05/27/2017

CLINICAL DATA: Chest pain with shortness of breath

EXAM:
CHEST  2 VIEW

[chest pa]
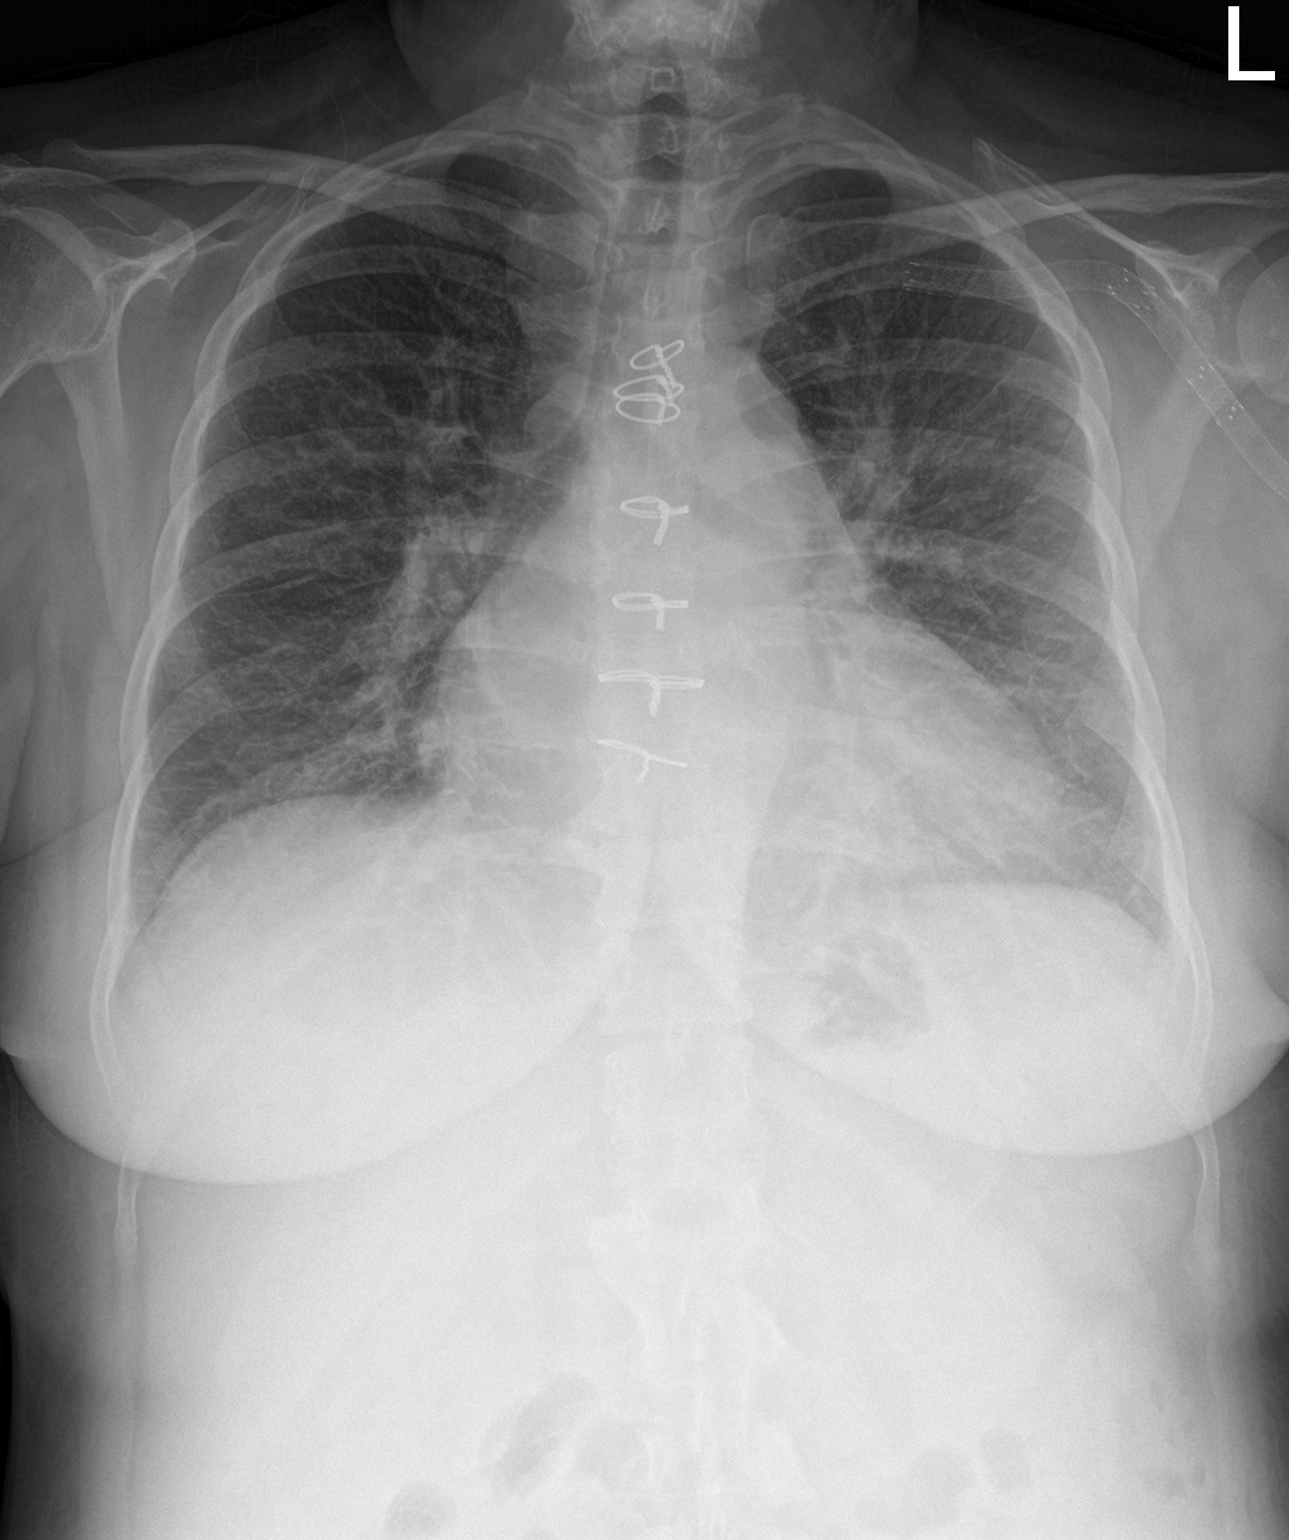

[chest lat]
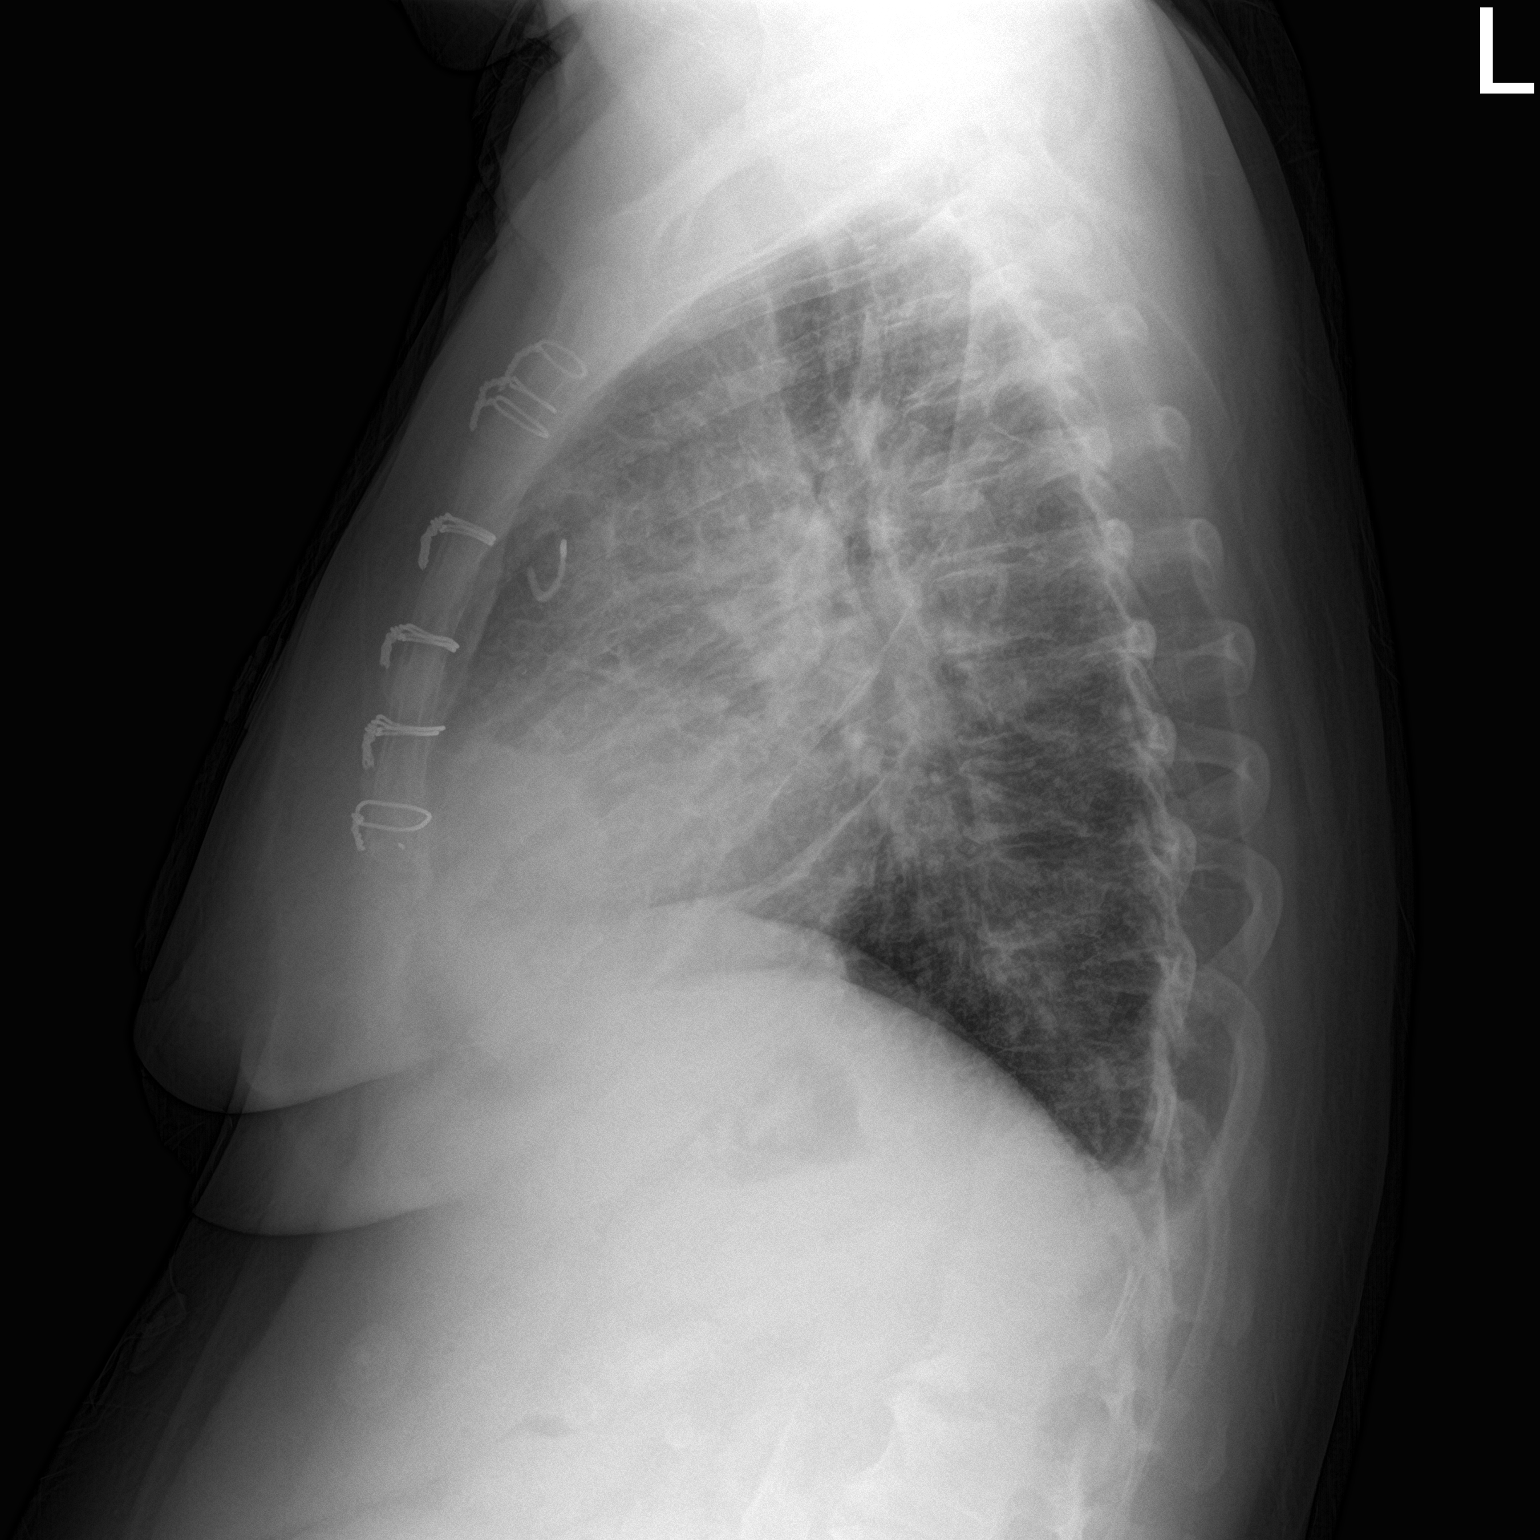

[2 of 2 positions shown; findings below may reference images not displayed]

FINDINGS: Post sternotomy changes. Tiny pleural effusions best seen on lateral
view. Cardiomegaly with mild diffuse interstitial opacities
suspicious for edema. No pneumothorax. Left axillary and subclavian
vascular stent
IMPRESSION: Cardiomegaly with development of diffuse interstitial opacities
suspicious for pulmonary edema. There are tiny pleural effusions.

## 2021-05-18 HISTORY — PX: NEPHRECTOMY TRANSPLANTED ORGAN: SUR880

## 2021-09-29 HISTORY — PX: LIGATION OF ARTERIOVENOUS  FISTULA: SHX5948

## 2021-10-04 ENCOUNTER — Encounter (HOSPITAL_BASED_OUTPATIENT_CLINIC_OR_DEPARTMENT_OTHER): Payer: Self-pay | Admitting: Urology

## 2021-10-04 ENCOUNTER — Emergency Department (HOSPITAL_BASED_OUTPATIENT_CLINIC_OR_DEPARTMENT_OTHER)
Admission: EM | Admit: 2021-10-04 | Discharge: 2021-10-04 | Disposition: A | Discharge status: Home / Self Care | Payer: BC Managed Care – PPO | Attending: Emergency Medicine | Admitting: Emergency Medicine

## 2021-10-04 ENCOUNTER — Other Ambulatory Visit: Payer: Self-pay

## 2021-10-04 ENCOUNTER — Emergency Department (HOSPITAL_BASED_OUTPATIENT_CLINIC_OR_DEPARTMENT_OTHER): Payer: BC Managed Care – PPO

## 2021-10-04 DIAGNOSIS — S6992XA Unspecified injury of left wrist, hand and finger(s), initial encounter: Secondary | ICD-10-CM | POA: Diagnosis not present

## 2021-10-04 DIAGNOSIS — X58XXXA Exposure to other specified factors, initial encounter: Secondary | ICD-10-CM | POA: Insufficient documentation

## 2021-10-04 DIAGNOSIS — N186 End stage renal disease: Secondary | ICD-10-CM | POA: Diagnosis not present

## 2021-10-04 LAB — COMPREHENSIVE METABOLIC PANEL
ALT: 39 U/L (ref 0–44)
AST: 32 U/L (ref 15–41)
Albumin: 3.9 g/dL (ref 3.5–5.0)
Alkaline Phosphatase: 99 U/L (ref 38–126)
Anion gap: 11 (ref 5–15)
BUN: 15 mg/dL (ref 6–20)
CO2: 19 mmol/L — ABNORMAL LOW (ref 22–32)
Calcium: 9 mg/dL (ref 8.9–10.3)
Chloride: 102 mmol/L (ref 98–111)
Creatinine, Ser: 1.02 mg/dL — ABNORMAL HIGH (ref 0.44–1.00)
GFR, Estimated: >60 mL/min (ref >60)
Glucose, Bld: 280 mg/dL — ABNORMAL HIGH (ref 70–99)
Potassium: 4.5 mmol/L (ref 3.5–5.1)
Sodium: 132 mmol/L — ABNORMAL LOW (ref 135–145)
Total Bilirubin: 0.4 mg/dL (ref 0.3–1.2)
Total Protein: 7.7 g/dL (ref 6.5–8.1)

## 2021-10-04 LAB — CBC WITH DIFFERENTIAL/PLATELET
Abs Immature Granulocytes: 0.38 10*3/uL — ABNORMAL HIGH (ref 0.00–0.07)
Basophils Absolute: 0.1 10*3/uL (ref 0.0–0.1)
Basophils Relative: 2 %
Eosinophils Absolute: 0.1 10*3/uL (ref 0.0–0.5)
Eosinophils Relative: 3 %
HCT: 44 % (ref 36.0–46.0)
Hemoglobin: 14.2 g/dL (ref 12.0–15.0)
Immature Granulocytes: 11 %
Lymphocytes Relative: 8 %
Lymphs Abs: 0.3 10*3/uL — ABNORMAL LOW (ref 0.7–4.0)
MCH: 30.1 pg (ref 26.0–34.0)
MCHC: 32.3 g/dL (ref 30.0–36.0)
MCV: 93.4 fL (ref 80.0–100.0)
Monocytes Absolute: 0.3 10*3/uL (ref 0.1–1.0)
Monocytes Relative: 9 %
Neutro Abs: 2.4 10*3/uL (ref 1.7–7.7)
Neutrophils Relative %: 67 %
Platelets: 213 10*3/uL (ref 150–400)
RBC: 4.71 MIL/uL (ref 3.87–5.11)
RDW: 13.1 % (ref 11.5–15.5)
Smear Review: NORMAL
WBC Morphology: INCREASED
WBC: 3.5 10*3/uL — ABNORMAL LOW (ref 4.0–10.5)
nRBC: 0 % (ref 0.0–0.2)

## 2021-10-04 LAB — CK: Total CK: 72 U/L (ref 38–234)

## 2021-10-04 LAB — LACTIC ACID, PLASMA: Lactic Acid, Venous: 1.9 mmol/L (ref 0.5–1.9)

## 2021-10-04 MED ORDER — LACTATED RINGERS IV BOLUS
500.0000 mL | Freq: Once | INTRAVENOUS | Status: AC
  Administered 2021-10-04: 500 mL via INTRAVENOUS

## 2021-10-04 MED ORDER — LIDOCAINE HCL 2 % IJ SOLN
10.0000 mL | Freq: Once | INTRAMUSCULAR | Status: AC
  Administered 2021-10-04: 200 mg via INTRADERMAL
  Filled 2021-10-04: qty 20

## 2021-10-04 MED ORDER — OXYCODONE HCL 5 MG PO TABS: 5.0000 mg | ORAL_TABLET | Freq: Four times a day (QID) | ORAL | 0 refills | Status: AC | PRN

## 2021-10-04 MED ORDER — HYDROCODONE-ACETAMINOPHEN 5-325 MG PO TABS
1.0000 | ORAL_TABLET | Freq: Once | ORAL | Status: AC
  Administered 2021-10-04: 1 via ORAL
  Filled 2021-10-04: qty 1

## 2021-10-04 NOTE — Discharge Instructions (Addendum)
There is a number below to call for a hand specialist.  Additionally, he should reach out to your vascular surgery team to see if they can schedule you an appointment for sooner than previously scheduled.  If you develop any worsening of swelling or any additional symptoms, please return to the emergency department.

## 2021-10-04 NOTE — ED Provider Notes (Signed)
[facility] EMERGENCY DEPARTMENT Provider Note   CSN: [id number] Arrival date & time: 10/04/21  1642     History  Chief Complaint  Patient presents with   Circulatory Problem    Isabel Sharp is a 51 y.o. female.  HPI Patient presents for pain and swelling to the distal aspect of her third digit of the left hand.  Pertinent medical history is as follows: Patient had a previous fistula placed in his left forearm due to ESRD.  He has since undergone kidney transplant.  Currently, transplant is functioning well.  1 week ago she was admitted to [facility] hospital for concern of ischemia to his second and third digits.  She was transferred to [facility] for vascular surgery evaluation.  Vascular surgery performed a fistula ligation on 12/27.  She has since had color return to her digits.  She presents today for swelling, pain, and tenderness to the distal aspect of her second digit.  She reports that the swelling and pain has been ongoing since before her recent fistula ligation.  She denies any systemic symptoms.    Home Medications Prior to Admission medications   Medication Sig Start Date End Date Taking? Authorizing Provider  oxyCODONE (ROXICODONE) 5 MG immediate release tablet Take 1 tablet (5 mg total) by mouth every 6 (six) hours as needed for up to 3 days for severe pain. 10/04/21 10/07/21 Yes Gloris Manchester, MD  acetaminophen (TYLENOL) 325 MG tablet Take 325 mg by mouth 2 (two) times daily as needed (pain).    [provider]  aspirin 325 MG tablet Take 325 mg by mouth daily.    [provider]  bumetanide (BUMEX) 2 MG tablet Take 2 mg by mouth daily.    [provider]  carvedilol (COREG) 25 MG tablet Take 25 mg by mouth 2 (two) times daily.     [provider]  Cholecalciferol (VITAMIN D3) 1000 UNITS CAPS Take by mouth every morning.    [provider]  cloNIDine (CATAPRES) 0.1 MG tablet Take 1 tablet (0.1 mg total)  by mouth daily. 02/22/16   Osvaldo Shipper, MD  diltiazem (CARDIZEM CD) 300 MG 24 hr capsule Take 300 mg by mouth daily.    [provider]  DOCOSAHEXAENOIC ACID PO Take 1 capsule by mouth every morning.    [provider]  docusate sodium (COLACE) 100 MG capsule Take 100 mg by mouth 2 (two) times daily.    [provider]  epoetin alfa (EPOGEN,PROCRIT) 59344 UNIT/ML injection Inject 10,000 Units into the skin every Monday, Wednesday, and Friday.     [provider]  Ferric Citrate (AURYXIA) 1 GM 210 MG(Fe) TABS Take 210 mg by mouth 3 (three) times daily with meals.     [provider]  glipiZIDE (GLUCOTROL) 10 MG tablet Take 10 mg by mouth daily before breakfast.     [provider]  hydrALAZINE (APRESOLINE) 100 MG tablet Take 100 mg by mouth 3 (three) times daily.    [provider]  insulin glargine (LANTUS) 100 UNIT/ML injection Inject 15 Units into the skin every morning. > 140    [provider]  isosorbide dinitrate (ISORDIL) 5 MG tablet Take 2 tablets (10 mg total) by mouth 3 (three) times daily. 02/22/16   Osvaldo Shipper, MD  lidocaine-prilocaine (EMLA) cream Apply 1 application topically every Monday, Wednesday, and Friday. 01/01/16   [provider]  losartan (COZAAR) 100 MG tablet Take 100 mg by mouth daily.  [provider]  losartan (COZAAR) 50 MG tablet Take 2 tablets (100 mg total) by mouth daily. 02/06/16 03/07/16  Caren Griffins, MD  Omega-3 Krill Oil 500 MG CAPS Take 500 mg by mouth daily.     [provider]  OVER THE COUNTER MEDICATION Take 30 mLs by mouth every Monday, Wednesday, and Friday.    [provider]  polyethylene glycol (MIRALAX / GLYCOLAX) packet Take 17 g by mouth daily as needed for mild constipation.    [provider]  pravastatin (PRAVACHOL) 40 MG tablet Take 40 mg by mouth at bedtime.    [provider]  rosuvastatin (CRESTOR) 20 MG  tablet Take 20 mg by mouth daily.    [provider]  sevelamer carbonate (RENVELA) 800 MG tablet Take 2,400 mg by mouth 3 (three) times daily with meals.    [provider]  Ticagrelor (BRILINTA PO) Take by mouth.    [provider]      Allergies    Norvasc [amlodipine] and Vicks blue cough lozenges [menthol]    Review of Systems   Review of Systems  Constitutional:  Negative for activity change, appetite change, chills, fatigue and fever.  HENT:  Negative for ear pain and sore throat.   Eyes:  Negative for pain and visual disturbance.  Respiratory:  Negative for cough and shortness of breath.   Cardiovascular:  Negative for chest pain and palpitations.  Gastrointestinal:  Negative for abdominal pain and vomiting.  Genitourinary:  Negative for dysuria and hematuria.  Musculoskeletal:  Negative for arthralgias, back pain, gait problem, myalgias and neck pain.       Pain and swelling to distal second digit on left hand.  Skin:  Negative for color change and rash.  Allergic/Immunologic: Positive for immunocompromised state.  Neurological:  Negative for dizziness, seizures, syncope, weakness, light-headedness, numbness and headaches.  Psychiatric/Behavioral:  Negative for confusion and decreased concentration.   All other systems reviewed and are negative.  Physical Exam Updated Vital Signs BP (!) 169/82 (BP Location: Right Arm)    Pulse 67    Temp 98.2 F (36.8 C) (Oral)    Resp 18    Ht $R'5\' 1"'by$  (1.549 m)    Wt 73.5 kg    SpO2 100%    BMI 30.61 kg/m  Physical Exam Vitals and nursing note reviewed.  Constitutional:      General: She is not in acute distress.    Appearance: Normal appearance. She is well-developed and normal weight. She is not ill-appearing, toxic-appearing or diaphoretic.  HENT:     Head: Normocephalic and atraumatic.     Right Ear: External ear normal.     Left Ear: External ear normal.     Nose: Nose normal. No congestion.  Eyes:      General: No scleral icterus.    Extraocular Movements: Extraocular movements intact.     Conjunctiva/sclera: Conjunctivae normal.  Cardiovascular:     Rate and Rhythm: Normal rate and regular rhythm.     Heart sounds: No murmur heard.    Comments: Dopplerable pulses in ulnar and radial aspects of base of second finger. Pulmonary:     Effort: Pulmonary effort is normal. No respiratory distress.  Abdominal:     Palpations: Abdomen is soft.     Tenderness: There is no abdominal tenderness.  Musculoskeletal:        General: Swelling and tenderness present.     Cervical back: Normal range of motion and neck supple. No  rigidity.  Skin:    General: Skin is warm and dry.     Capillary Refill: Capillary refill takes less than 2 seconds.     Coloration: Skin is not jaundiced or pale.  Neurological:     General: No focal deficit present.     Mental Status: She is alert and oriented to person, place, and time.     Cranial Nerves: No cranial nerve deficit.     Sensory: No sensory deficit.     Motor: No weakness.     Coordination: Coordination normal.  Psychiatric:        Mood and Affect: Mood normal.        Behavior: Behavior normal.        Thought Content: Thought content normal.        Judgment: Judgment normal.         ED Results / Procedures / Treatments   Labs (all labs ordered are listed, but only abnormal results are displayed) Labs Reviewed  CBC WITH DIFFERENTIAL/PLATELET - Abnormal; Notable for the following components:      Result Value   WBC 3.5 (*)    Lymphs Abs 0.3 (*)    Abs Immature Granulocytes 0.38 (*)    All other components within normal limits  COMPREHENSIVE METABOLIC PANEL - Abnormal; Notable for the following components:   Sodium 132 (*)    CO2 19 (*)    Glucose, Bld 280 (*)    Creatinine, Ser 1.02 (*)    All other components within normal limits  CK  LACTIC ACID, PLASMA  PATHOLOGIST SMEAR REVIEW    EKG None  Radiology DG Finger Index  Left  Result Date: 10/04/2021 CLINICAL DATA:  Swelling for 10 days. EXAM: LEFT INDEX FINGER 2+V COMPARISON:  None. FINDINGS: Peripheral vascular calcifications are present. There is soft tissue swelling of the distal second finger. There is a nondisplaced fracture of the distal tuft. There is no dislocation. Joint spaces are maintained. IMPRESSION: 1. Nondisplaced fracture distal tuft of the second phalanx with overlying soft tissue swelling. 2. Peripheral vascular calcifications. Electronically Signed   By: Darliss Cheney M.D.   On: 10/04/2021 17:43    Procedures .Nerve Block  Date/Time: 10/04/2021 6:35 PM Performed by: Gloris Manchester, MD Authorized by: Gloris Manchester, MD   Consent:    Consent obtained:  Verbal   Consent given by:  Patient   Risks, benefits, and alternatives were discussed: yes     Risks discussed:  Bleeding and pain   Alternatives discussed:  No treatment and delayed treatment Universal protocol:    Procedure explained and questions answered to patient or proxy's satisfaction: yes     Imaging studies available: yes     Patient identity confirmed:  Verbally with patient Indications:    Indications:  Pain relief Location:    Body area:  Upper extremity   Upper extremity nerve blocked: Digital.   Laterality:  Left Pre-procedure details:    Skin preparation:  Povidone-iodine   Preparation: Patient was prepped and draped in usual sterile fashion   Skin anesthesia:    Skin anesthesia method:  None Procedure details:    Block needle gauge:  27 G   Anesthetic injected:  Lidocaine 2% w/o epi   Steroid injected:  None   Additive injected:  None   Injection procedure:  Anatomic landmarks identified   Paresthesia:  None Post-procedure details:    Outcome:  Anesthesia achieved   Procedure completion:  Tolerated well, no immediate complications  Medications Ordered in ED Medications  HYDROcodone-acetaminophen (NORCO/VICODIN) 5-325 MG per tablet 1 tablet (1 tablet Oral Given  10/04/21 1734)  lidocaine (XYLOCAINE) 2 % (with pres) injection 200 mg (200 mg Intradermal Given 10/04/21 1734)  lactated ringers bolus 500 mL (0 mLs Intravenous Stopped 10/04/21 1946)    ED Course/ Medical Decision Making/ A&P                           Medical Decision Making  51 year old female with history of renal transplant 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  On exam, left hand digits are well-perfused.  This includes the painful, swollen digit.  Cap refill his present.  Pulses are dopplerable in finger in question.  Patient has a swollen pad that is quite tender.  This raises concern of a felon.  Digital nerve block was performed to assist in examination.  Following nerve block, patient had complete analgesia of her second digit.  On further examination, pad of finger is not tense.  The area on the dorsum, just proximal to fingernail, does appear to have fluid.  Its appearance and consistency is more consistent with blister than abscess.  I did place an ultrasound probe on finger in question.  No significant fluid collection was seen on pad of finger.  I was also able to perform a more detailed examination of finger following anesthesia.  Swollen pad of finger does not feel tense.   Given these exam findings, I have less suspicion of felon.  Additionally, patient states that this swelling has been present for 1.5 weeks, including during her hospitalization.  At that time, her medical team did not have concern of infection to this area.  An x-ray was obtained which did show a distal tuft fracture of the affected finger.  Patient is unaware of how this fracture occurred.  She has not had any recent or previous injuries to this finger.  Swelling is likely secondary to this occult fracture.  Additionally, this  is the digit that was undergoing ischemia only 1 week ago and this ischemia has since resolved.  There may be a component of reperfusion syndrome here.  IV fluids were given.  Additional lab work was checked.  Patient's lactic acid was normal.  Patient was advised to follow-up with hand specialist.  She was also advised to contact her vascular surgery team to update them.  Patient was discharged in stable condition.        Final Clinical Impression(s) / ED Diagnoses Final diagnoses:  Injury of finger of left hand, initial encounter    Rx / DC Orders ED Discharge Orders          Ordered    oxyCODONE (ROXICODONE) 5 MG immediate release tablet  Every 6 hours PRN        10/04/21 1941              Godfrey Pick, MD 10/06/21 [phone]

## 2021-10-04 NOTE — ED Triage Notes (Signed)
H/o dialysis, post kidney transplant Has fistula in left forearm, was pulling blood from left hand/fingers On 12/27 litigated Fistula,  Left 1st and 2nd digits are dull in color, concern for lack of blood flow.  Fingers are painful, swelling noted to tips of both fingers.

## 2021-10-07 LAB — PATHOLOGIST SMEAR REVIEW

## 2022-05-03 IMAGING — DX DG FINGER INDEX 2+V*L*
3 series · 3 of 3 positions shown · non-contrast
Comparison: None.

CLINICAL DATA: Swelling for 10 days.

EXAM:
LEFT INDEX FINGER 2+V

[finger ap]
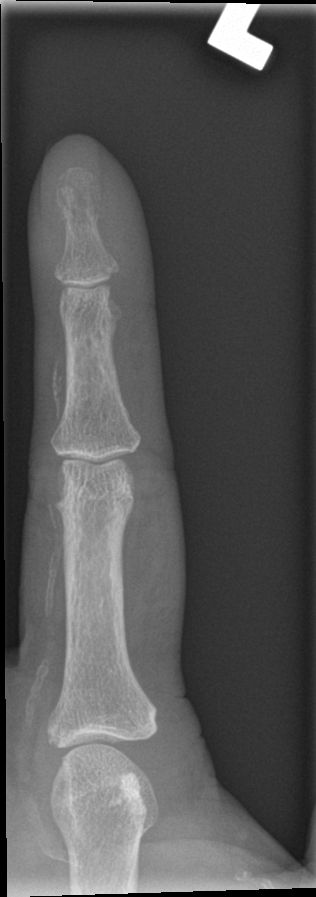

[finger obl]
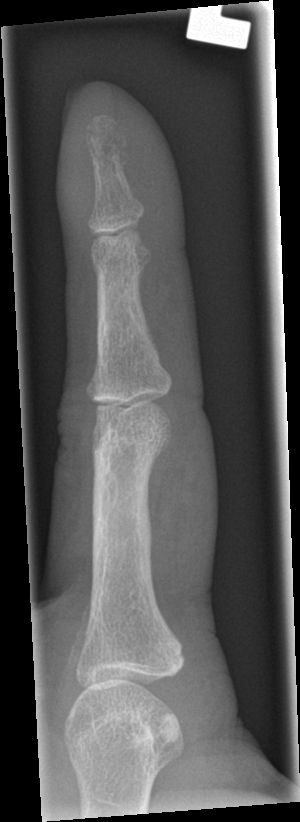

[finger lat]
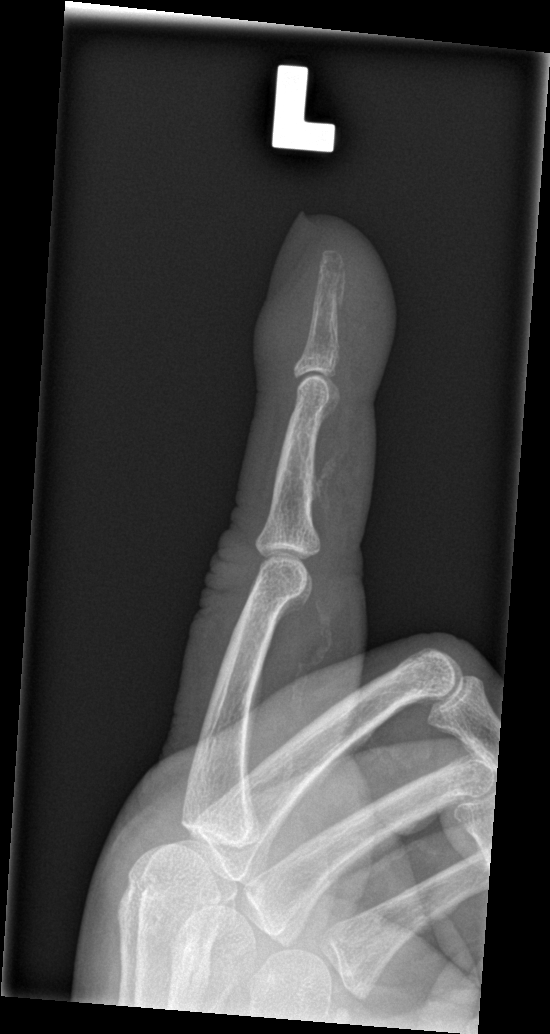

[3 of 3 positions shown; findings below may reference images not displayed]

FINDINGS: Peripheral vascular calcifications are present. There is soft tissue
swelling of the distal second finger. There is a nondisplaced
fracture of the distal tuft. There is no dislocation. Joint spaces
are maintained.
IMPRESSION: 1. Nondisplaced fracture distal tuft of the second phalanx with
overlying soft tissue swelling.
2. Peripheral vascular calcifications.
